# Patient Record
Sex: Female | Born: 1955 | Race: White | Hispanic: No | Marital: Married | Smoking: Current every day smoker
Health system: Southern US, Community
[De-identification: ages and names within clinical notes are randomized; demographics above are authoritative.]

## PROBLEM LIST (undated history)

## (undated) DIAGNOSIS — I10 Essential (primary) hypertension: Secondary | ICD-10-CM

## (undated) DIAGNOSIS — G709 Myoneural disorder, unspecified: Secondary | ICD-10-CM

## (undated) DIAGNOSIS — E079 Disorder of thyroid, unspecified: Secondary | ICD-10-CM

## (undated) DIAGNOSIS — I739 Peripheral vascular disease, unspecified: Secondary | ICD-10-CM

## (undated) DIAGNOSIS — E785 Hyperlipidemia, unspecified: Secondary | ICD-10-CM

## (undated) DIAGNOSIS — D649 Anemia, unspecified: Secondary | ICD-10-CM

## (undated) DIAGNOSIS — T7840XA Allergy, unspecified, initial encounter: Secondary | ICD-10-CM

## (undated) DIAGNOSIS — K219 Gastro-esophageal reflux disease without esophagitis: Secondary | ICD-10-CM

## (undated) DIAGNOSIS — K900 Celiac disease: Secondary | ICD-10-CM

## (undated) DIAGNOSIS — D126 Benign neoplasm of colon, unspecified: Secondary | ICD-10-CM

## (undated) DIAGNOSIS — M797 Fibromyalgia: Secondary | ICD-10-CM

## (undated) DIAGNOSIS — M1990 Unspecified osteoarthritis, unspecified site: Secondary | ICD-10-CM

## (undated) DIAGNOSIS — R011 Cardiac murmur, unspecified: Secondary | ICD-10-CM

## (undated) DIAGNOSIS — K449 Diaphragmatic hernia without obstruction or gangrene: Secondary | ICD-10-CM

## (undated) DIAGNOSIS — K760 Fatty (change of) liver, not elsewhere classified: Secondary | ICD-10-CM

## (undated) HISTORY — PX: HAND SURGERY: SHX662

## (undated) HISTORY — DX: Fibromyalgia: M79.7

## (undated) HISTORY — DX: Anemia, unspecified: D64.9

## (undated) HISTORY — DX: Allergy, unspecified, initial encounter: T78.40XA

## (undated) HISTORY — PX: CERVICAL DISC SURGERY: SHX588

## (undated) HISTORY — PX: TOTAL ABDOMINAL HYSTERECTOMY: SHX209

## (undated) HISTORY — DX: Hyperlipidemia, unspecified: E78.5

## (undated) HISTORY — DX: Disorder of thyroid, unspecified: E07.9

## (undated) HISTORY — DX: Fatty (change of) liver, not elsewhere classified: K76.0

## (undated) HISTORY — DX: Peripheral vascular disease, unspecified: I73.9

## (undated) HISTORY — PX: OTHER SURGICAL HISTORY: SHX169

## (undated) HISTORY — PX: UPPER GASTROINTESTINAL ENDOSCOPY: SHX188

## (undated) HISTORY — DX: Cardiac murmur, unspecified: R01.1

## (undated) HISTORY — DX: Gastro-esophageal reflux disease without esophagitis: K21.9

## (undated) HISTORY — DX: Unspecified osteoarthritis, unspecified site: M19.90

## (undated) HISTORY — DX: Celiac disease: K90.0

## (undated) HISTORY — DX: Myoneural disorder, unspecified: G70.9

## (undated) HISTORY — DX: Benign neoplasm of colon, unspecified: D12.6

## (undated) HISTORY — DX: Diaphragmatic hernia without obstruction or gangrene: K44.9

## (undated) HISTORY — PX: CHOLECYSTECTOMY: SHX55

## (undated) HISTORY — PX: DILATION AND CURETTAGE OF UTERUS: SHX78

## (undated) HISTORY — DX: Essential (primary) hypertension: I10

## (undated) HISTORY — PX: COLONOSCOPY: SHX174

---

## 1999-09-15 ENCOUNTER — Encounter: Admission: RE | Admit: 1999-09-15 | Discharge: 1999-09-15 | Payer: Self-pay | Admitting: Orthopaedic Surgery

## 1999-09-15 ENCOUNTER — Encounter: Payer: Self-pay | Admitting: Orthopaedic Surgery

## 1999-09-27 ENCOUNTER — Encounter: Admission: RE | Admit: 1999-09-27 | Discharge: 1999-10-20 | Payer: Self-pay | Admitting: Orthopaedic Surgery

## 2000-03-30 ENCOUNTER — Encounter: Payer: Self-pay | Admitting: Orthopaedic Surgery

## 2000-03-30 ENCOUNTER — Encounter: Admission: RE | Admit: 2000-03-30 | Discharge: 2000-03-30 | Payer: Self-pay | Admitting: Orthopaedic Surgery

## 2000-09-04 ENCOUNTER — Other Ambulatory Visit: Admission: RE | Admit: 2000-09-04 | Discharge: 2000-09-04 | Payer: Self-pay | Admitting: Gastroenterology

## 2000-09-04 ENCOUNTER — Encounter (INDEPENDENT_AMBULATORY_CARE_PROVIDER_SITE_OTHER): Payer: Self-pay | Admitting: Specialist

## 2002-02-22 ENCOUNTER — Encounter: Admission: RE | Admit: 2002-02-22 | Discharge: 2002-02-22 | Payer: Self-pay | Admitting: Obstetrics and Gynecology

## 2002-02-22 ENCOUNTER — Encounter: Payer: Self-pay | Admitting: Obstetrics and Gynecology

## 2004-11-02 ENCOUNTER — Ambulatory Visit (HOSPITAL_COMMUNITY): Admission: RE | Admit: 2004-11-02 | Discharge: 2004-11-02 | Payer: Self-pay | Admitting: Family Medicine

## 2005-02-09 ENCOUNTER — Ambulatory Visit: Payer: Self-pay | Admitting: Gastroenterology

## 2005-02-11 ENCOUNTER — Ambulatory Visit: Payer: Self-pay | Admitting: Gastroenterology

## 2005-02-15 ENCOUNTER — Ambulatory Visit: Payer: Self-pay | Admitting: Gastroenterology

## 2005-02-15 ENCOUNTER — Encounter (INDEPENDENT_AMBULATORY_CARE_PROVIDER_SITE_OTHER): Payer: Self-pay | Admitting: Specialist

## 2005-03-18 ENCOUNTER — Encounter: Admission: RE | Admit: 2005-03-18 | Discharge: 2005-03-18 | Payer: Self-pay | Admitting: Obstetrics and Gynecology

## 2005-03-24 ENCOUNTER — Ambulatory Visit: Payer: Self-pay | Admitting: Gastroenterology

## 2005-04-04 ENCOUNTER — Ambulatory Visit (HOSPITAL_COMMUNITY): Admission: RE | Admit: 2005-04-04 | Discharge: 2005-04-04 | Payer: Self-pay | Admitting: Gastroenterology

## 2005-05-31 ENCOUNTER — Encounter: Admission: RE | Admit: 2005-05-31 | Discharge: 2005-05-31 | Payer: Self-pay | Admitting: General Surgery

## 2005-06-07 ENCOUNTER — Encounter (INDEPENDENT_AMBULATORY_CARE_PROVIDER_SITE_OTHER): Payer: Self-pay | Admitting: Specialist

## 2005-06-07 ENCOUNTER — Ambulatory Visit (HOSPITAL_COMMUNITY): Admission: RE | Admit: 2005-06-07 | Discharge: 2005-06-07 | Payer: Self-pay | Admitting: General Surgery

## 2005-06-28 ENCOUNTER — Ambulatory Visit: Payer: Self-pay | Admitting: Gastroenterology

## 2005-07-01 ENCOUNTER — Encounter: Payer: Self-pay | Admitting: Gastroenterology

## 2005-07-04 ENCOUNTER — Ambulatory Visit: Payer: Self-pay | Admitting: Gastroenterology

## 2005-07-14 ENCOUNTER — Encounter: Admission: RE | Admit: 2005-07-14 | Discharge: 2005-10-12 | Payer: Self-pay | Admitting: Gastroenterology

## 2005-09-26 ENCOUNTER — Ambulatory Visit: Payer: Self-pay | Admitting: Gastroenterology

## 2006-03-21 ENCOUNTER — Ambulatory Visit (HOSPITAL_COMMUNITY): Admission: RE | Admit: 2006-03-21 | Discharge: 2006-03-21 | Payer: Self-pay | Admitting: Obstetrics and Gynecology

## 2006-05-29 ENCOUNTER — Ambulatory Visit: Payer: Self-pay | Admitting: Gastroenterology

## 2006-08-29 ENCOUNTER — Encounter (INDEPENDENT_AMBULATORY_CARE_PROVIDER_SITE_OTHER): Payer: Self-pay | Admitting: Specialist

## 2006-08-29 ENCOUNTER — Ambulatory Visit: Payer: Self-pay | Admitting: Gastroenterology

## 2007-03-23 ENCOUNTER — Ambulatory Visit (HOSPITAL_COMMUNITY): Admission: RE | Admit: 2007-03-23 | Discharge: 2007-03-23 | Payer: Self-pay | Admitting: Obstetrics and Gynecology

## 2007-09-04 ENCOUNTER — Ambulatory Visit: Payer: Self-pay | Admitting: Gastroenterology

## 2007-09-18 ENCOUNTER — Ambulatory Visit: Payer: Self-pay | Admitting: Gastroenterology

## 2007-09-18 ENCOUNTER — Encounter: Payer: Self-pay | Admitting: Gastroenterology

## 2007-09-20 ENCOUNTER — Encounter: Payer: Self-pay | Admitting: Gastroenterology

## 2007-09-27 ENCOUNTER — Telehealth: Payer: Self-pay | Admitting: Gastroenterology

## 2008-04-29 ENCOUNTER — Ambulatory Visit (HOSPITAL_COMMUNITY): Admission: RE | Admit: 2008-04-29 | Discharge: 2008-04-29 | Payer: Self-pay | Admitting: Obstetrics and Gynecology

## 2008-08-15 ENCOUNTER — Encounter (INDEPENDENT_AMBULATORY_CARE_PROVIDER_SITE_OTHER): Payer: Self-pay | Admitting: *Deleted

## 2008-08-20 ENCOUNTER — Encounter: Admission: RE | Admit: 2008-08-20 | Discharge: 2008-08-20 | Payer: Self-pay | Admitting: Orthopaedic Surgery

## 2008-09-23 ENCOUNTER — Ambulatory Visit: Payer: Self-pay | Admitting: Gastroenterology

## 2008-09-30 ENCOUNTER — Ambulatory Visit: Payer: Self-pay | Admitting: Gastroenterology

## 2008-09-30 ENCOUNTER — Encounter: Payer: Self-pay | Admitting: Gastroenterology

## 2008-10-01 ENCOUNTER — Encounter: Payer: Self-pay | Admitting: Gastroenterology

## 2008-10-01 ENCOUNTER — Telehealth (INDEPENDENT_AMBULATORY_CARE_PROVIDER_SITE_OTHER): Payer: Self-pay

## 2008-10-03 ENCOUNTER — Ambulatory Visit: Payer: Self-pay | Admitting: Gastroenterology

## 2008-10-03 ENCOUNTER — Telehealth: Payer: Self-pay | Admitting: Gastroenterology

## 2008-10-08 LAB — CONVERTED CEMR LAB: Tissue Transglutaminase Ab, IgA: >128 U — ABNORMAL HIGH

## 2008-10-21 ENCOUNTER — Ambulatory Visit: Payer: Self-pay | Admitting: Family Medicine

## 2008-10-21 DIAGNOSIS — K219 Gastro-esophageal reflux disease without esophagitis: Secondary | ICD-10-CM | POA: Insufficient documentation

## 2008-10-21 DIAGNOSIS — Z860100 Personal history of colon polyps, unspecified: Secondary | ICD-10-CM | POA: Insufficient documentation

## 2008-10-21 DIAGNOSIS — R42 Dizziness and giddiness: Secondary | ICD-10-CM | POA: Insufficient documentation

## 2008-10-21 DIAGNOSIS — D509 Iron deficiency anemia, unspecified: Secondary | ICD-10-CM | POA: Insufficient documentation

## 2008-10-21 LAB — CONVERTED CEMR LAB: Blood Glucose, Fingerstick: 126

## 2009-02-20 ENCOUNTER — Ambulatory Visit: Payer: Self-pay | Admitting: Family Medicine

## 2009-06-03 ENCOUNTER — Ambulatory Visit: Payer: Self-pay | Admitting: Family Medicine

## 2009-06-03 DIAGNOSIS — M542 Cervicalgia: Secondary | ICD-10-CM | POA: Insufficient documentation

## 2009-06-12 ENCOUNTER — Ambulatory Visit: Payer: Self-pay | Admitting: Family Medicine

## 2009-06-12 DIAGNOSIS — M797 Fibromyalgia: Secondary | ICD-10-CM | POA: Insufficient documentation

## 2009-06-16 ENCOUNTER — Ambulatory Visit (HOSPITAL_COMMUNITY): Admission: RE | Admit: 2009-06-16 | Discharge: 2009-06-16 | Payer: Self-pay | Admitting: Obstetrics and Gynecology

## 2009-07-16 ENCOUNTER — Ambulatory Visit (HOSPITAL_COMMUNITY): Admission: RE | Admit: 2009-07-16 | Discharge: 2009-07-16 | Payer: Self-pay | Admitting: Neurological Surgery

## 2009-08-17 ENCOUNTER — Encounter: Admission: RE | Admit: 2009-08-17 | Discharge: 2009-08-17 | Payer: Self-pay | Admitting: Neurological Surgery

## 2009-09-07 ENCOUNTER — Ambulatory Visit: Payer: Self-pay | Admitting: Family Medicine

## 2009-09-07 DIAGNOSIS — I10 Essential (primary) hypertension: Secondary | ICD-10-CM | POA: Insufficient documentation

## 2009-09-07 DIAGNOSIS — H8109 Meniere's disease, unspecified ear: Secondary | ICD-10-CM | POA: Insufficient documentation

## 2009-09-07 DIAGNOSIS — E039 Hypothyroidism, unspecified: Secondary | ICD-10-CM | POA: Insufficient documentation

## 2009-09-07 DIAGNOSIS — R1011 Right upper quadrant pain: Secondary | ICD-10-CM | POA: Insufficient documentation

## 2009-09-09 LAB — CONVERTED CEMR LAB
BUN: 15 mg/dL (ref 6–23)
CO2: 33 meq/L — ABNORMAL HIGH (ref 19–32)
Calcium: 10.3 mg/dL (ref 8.4–10.5)
Chloride: 99 meq/L (ref 96–112)
Creatinine, Ser: 0.6 mg/dL (ref 0.4–1.2)
GFR calc non Af Amer: 110.84 mL/min (ref >60)
Glucose, Bld: 87 mg/dL (ref 70–99)
Potassium: 3.9 meq/L (ref 3.5–5.1)
Sodium: 141 meq/L (ref 135–145)
TSH: 1.53 microintl units/mL (ref 0.35–5.50)

## 2009-09-18 ENCOUNTER — Encounter (INDEPENDENT_AMBULATORY_CARE_PROVIDER_SITE_OTHER): Payer: Self-pay | Admitting: *Deleted

## 2009-09-23 ENCOUNTER — Telehealth: Payer: Self-pay | Admitting: Gastroenterology

## 2009-09-23 ENCOUNTER — Encounter: Payer: Self-pay | Admitting: Gastroenterology

## 2009-09-23 ENCOUNTER — Encounter (INDEPENDENT_AMBULATORY_CARE_PROVIDER_SITE_OTHER): Payer: Self-pay

## 2009-09-24 ENCOUNTER — Ambulatory Visit: Payer: Self-pay | Admitting: Gastroenterology

## 2009-09-25 ENCOUNTER — Ambulatory Visit: Payer: Self-pay | Admitting: Gastroenterology

## 2009-09-25 LAB — CONVERTED CEMR LAB: IgA: 143 mg/dL (ref 68–378)

## 2009-09-29 ENCOUNTER — Encounter: Payer: Self-pay | Admitting: Gastroenterology

## 2009-09-29 DIAGNOSIS — K900 Celiac disease: Secondary | ICD-10-CM | POA: Insufficient documentation

## 2009-09-29 LAB — CONVERTED CEMR LAB: Tissue Transglutaminase Ab, IgA: 160 units — ABNORMAL HIGH (ref <20)

## 2009-10-08 ENCOUNTER — Encounter: Payer: Self-pay | Admitting: Family Medicine

## 2009-10-10 ENCOUNTER — Encounter: Payer: Self-pay | Admitting: Gastroenterology

## 2009-10-16 ENCOUNTER — Encounter: Payer: Self-pay | Admitting: Family Medicine

## 2009-10-20 ENCOUNTER — Encounter: Admission: RE | Admit: 2009-10-20 | Discharge: 2009-10-20 | Payer: Self-pay | Admitting: Neurological Surgery

## 2009-10-21 ENCOUNTER — Telehealth (INDEPENDENT_AMBULATORY_CARE_PROVIDER_SITE_OTHER): Payer: Self-pay

## 2009-10-22 ENCOUNTER — Encounter: Payer: Self-pay | Admitting: Gastroenterology

## 2009-10-23 ENCOUNTER — Ambulatory Visit: Payer: Self-pay | Admitting: Gastroenterology

## 2009-10-26 LAB — CONVERTED CEMR LAB
ALT: 21 units/L (ref 0–35)
AST: 20 units/L (ref 0–37)
Albumin: 4 g/dL (ref 3.5–5.2)
Alkaline Phosphatase: 52 units/L (ref 39–117)
BUN: 17 mg/dL (ref 6–23)
Basophils Absolute: 0 10*3/uL (ref 0.0–0.1)
Basophils Relative: 0.5 % (ref 0.0–3.0)
CO2: 33 meq/L — ABNORMAL HIGH (ref 19–32)
Calcium: 9.9 mg/dL (ref 8.4–10.5)
Chloride: 101 meq/L (ref 96–112)
Creatinine, Ser: 0.5 mg/dL (ref 0.4–1.2)
Eosinophils Absolute: 0.1 10*3/uL (ref 0.0–0.7)
Eosinophils Relative: 1.4 % (ref 0.0–5.0)
Ferritin: 36.5 ng/mL (ref 10.0–291.0)
GFR calc non Af Amer: 127.84 mL/min (ref >60)
Glucose, Bld: 85 mg/dL (ref 70–99)
HCT: 37.5 % (ref 36.0–46.0)
Hemoglobin: 13.1 g/dL (ref 12.0–15.0)
Iron: 61 ug/dL (ref 42–145)
Lymphocytes Relative: 50.4 % — ABNORMAL HIGH (ref 12.0–46.0)
Lymphs Abs: 3.3 10*3/uL (ref 0.7–4.0)
MCHC: 35 g/dL (ref 30.0–36.0)
MCV: 93.8 fL (ref 78.0–100.0)
Monocytes Absolute: 0.3 10*3/uL (ref 0.1–1.0)
Monocytes Relative: 4.1 % (ref 3.0–12.0)
Neutro Abs: 2.9 10*3/uL (ref 1.4–7.7)
Neutrophils Relative %: 43.6 % (ref 43.0–77.0)
Platelets: 163 10*3/uL (ref 150.0–400.0)
Potassium: 3.9 meq/L (ref 3.5–5.1)
RBC: 4 M/uL (ref 3.87–5.11)
RDW: 13.4 % (ref 11.5–14.6)
Saturation Ratios: 17 % — ABNORMAL LOW (ref 20.0–50.0)
Sodium: 142 meq/L (ref 135–145)
Total Bilirubin: 0.5 mg/dL (ref 0.3–1.2)
Total Protein: 7 g/dL (ref 6.0–8.3)
Transferrin: 256.2 mg/dL (ref 212.0–360.0)
Vit D, 25-Hydroxy: 89 ng/mL (ref 30–89)
WBC: 6.6 10*3/uL (ref 4.5–10.5)

## 2009-10-30 ENCOUNTER — Encounter: Payer: Self-pay | Admitting: Family Medicine

## 2009-10-30 ENCOUNTER — Encounter: Payer: Self-pay | Admitting: Gastroenterology

## 2009-11-09 ENCOUNTER — Ambulatory Visit: Payer: Self-pay | Admitting: Family Medicine

## 2010-01-18 ENCOUNTER — Encounter: Admission: RE | Admit: 2010-01-18 | Discharge: 2010-01-18 | Payer: Self-pay | Admitting: Neurological Surgery

## 2010-03-08 ENCOUNTER — Emergency Department (HOSPITAL_COMMUNITY): Admission: EM | Admit: 2010-03-08 | Discharge: 2010-03-08 | Payer: Self-pay | Admitting: Emergency Medicine

## 2010-03-09 ENCOUNTER — Ambulatory Visit: Payer: Self-pay | Admitting: Family Medicine

## 2010-03-09 ENCOUNTER — Telehealth: Payer: Self-pay | Admitting: Family Medicine

## 2010-03-09 DIAGNOSIS — F1120 Opioid dependence, uncomplicated: Secondary | ICD-10-CM | POA: Insufficient documentation

## 2010-03-18 ENCOUNTER — Ambulatory Visit: Payer: Self-pay | Admitting: Family Medicine

## 2010-04-05 ENCOUNTER — Ambulatory Visit: Payer: Self-pay | Admitting: Family Medicine

## 2010-04-06 ENCOUNTER — Encounter: Payer: Self-pay | Admitting: Family Medicine

## 2010-04-07 ENCOUNTER — Telehealth: Payer: Self-pay | Admitting: Family Medicine

## 2010-04-28 ENCOUNTER — Encounter
Admission: RE | Admit: 2010-04-28 | Discharge: 2010-04-28 | Payer: Self-pay | Source: Home / Self Care | Admitting: Orthopaedic Surgery

## 2010-05-25 ENCOUNTER — Telehealth: Payer: Self-pay | Admitting: Gastroenterology

## 2010-05-27 ENCOUNTER — Ambulatory Visit: Admit: 2010-05-27 | Payer: Self-pay | Admitting: Gastroenterology

## 2010-06-13 ENCOUNTER — Encounter: Payer: Self-pay | Admitting: General Surgery

## 2010-06-22 NOTE — Assessment & Plan Note (Signed)
Summary: blood sugars running low to est here/mhf   Vital Signs:  Patient profile:   55 year old female LMP:     05/24/1995 Height:      62.75 inches Weight:      147 pounds BMI:     26.34 Temp:     97.8 degrees F oral BP sitting:   106 / 70  (left arm) Cuff size:   regular  Vitals Entered By: Nira Conn LPN (October 21, 9981 3:82 AM) CC: Previous [place] pt, to establish, requesting labs CBG Result 126 LMP (date): 05/24/1995  years   days  Enter LMP: 05/24/1995   History of Present Illness: Patient seen to establish care. She has history of celiac disease and history of GERD. She is here with chief complaint of concern that her blood sugars may be dropping too low. No history of diabetes. She recently felt dizzy and lightheaded and had some slight bilateral blurred vision and checked her blood sugar meter which was 42. She subsequently checked with her cousin's meter and this was in the normal range. She thinks her glucose meter may be malfunctioning. She tries to follow low gluten diet but sometimes has high sugar type beverages. She has not had any associated syncope, nausea, or diaphoresis. Symptoms have not been daily and had been very inconsistent.  Allergies (verified): No Known Drug Allergies  Past History:  Family History: Last updated: 10/21/2008 Family History of Alcoholism/Addiction blood relative Family History of Arthritis blood relative breast cancer blood relative lung cancer blood relative postate cancer, parent, blood relative heart disease, parnet, blood relative stroke, parent, blood relative Family History Hypertension, parent, blood relative Diabetes, parent, blood relative  Social History: Last updated: 10/21/2008 Retired Married Current Smoker Regular exercise-yes  Risk Factors: Smoking Status: current (10/21/2008)  Past Medical History: Anemia-iron deficiency Colonic polyps, hx of GERD Arthritis Cancer/malignancy heart murmur  migraines Thyroid problems/hypothyroidism celiac disease meniers disease  Past Surgical History: Cholecystectomy 2006 Hysterectomy  1997 Tonsillectomy  1963 Fractured nose 1988 nose 1990  Family History: Family History of Alcoholism/Addiction blood relative Family History of Arthritis blood relative breast cancer blood relative lung cancer blood relative postate cancer, parent, blood relative heart disease, parnet, blood relative stroke, parent, blood relative Family History Hypertension, parent, blood relative Diabetes, parent, blood relative  Social History: Retired Married Current Smoker Regular exercise-yes Smoking Status:  current Does Patient Exercise:  yes  Review of Systems  The patient denies anorexia, fever, weight loss, weight gain, vision loss, chest pain, syncope, and headaches.    Physical Exam  General:  Well-developed,well-nourished,in no acute distress; alert,appropriate and cooperative throughout examination Eyes:  No corneal or conjunctival inflammation noted. EOMI. Perrla. Funduscopic exam benign, without hemorrhages, exudates or papilledema. Vision grossly normal. Neck:  No deformities, masses, or tenderness noted. Lungs:  Normal respiratory effort, chest expands symmetrically. Lungs are clear to auscultation, no crackles or wheezes. Heart:  Normal rate and regular rhythm. S1 and S2 normal without gallop, murmur, click, rub or other extra sounds. Extremities:  no edema Neurologic:  cranial nerves II through XII are normal. Strength reveals no focal deficits. Cerebellar function normal   Impression & Recommendations:  Problem # 1:  DIZZINESS (ICD-780.4)  Doubt true hypoglycemia but will check fasting blood sugar to make sure. We have stressed the importance of not skipping meals and also trying to get a balanced diet with adequate amounts of protein with each meal and avoidance of high glycemic foods.  Orders: Glucose, (CBG) [phone])  Complete Medication List: 1)  Omeprazole 20 Mg Cpdr (Omeprazole) .[name].. 1 each day 30 minutes before meal qam 2)  Klor-con M10 10 Meq Cr-tabs (Potassium chloride crys cr) .... One tab two times a day 3)  Levothroid 200 Mcg Tabs (Levothyroxine sodium) .... Once daily 4)  Chlorthalidone 25 Mg Tabs (Chlorthalidone) .... Once daily 5)  Ultram 50 Mg Tabs (Tramadol hcl) .... 2 tabs two times a day 6)  Proair Hfa 108 (90 Base) Mcg/act Aers (Albuterol sulfate) .... Three times a day  Other Orders: Fingerstick (91660)  Patient Instructions: 1)  Avoid skipping meals. Avoid high glucose-containing beverages or other high glucose-containing foods. Try to get balanced diet with adequate amounts of protein with each meal.  Preventive Care Screening  Mammogram:    Date:  06/23/2008    Results:  normal   Colonoscopy:    Date:  05/23/2000    Results:  normal

## 2010-06-22 NOTE — Consult Note (Signed)
Summary: [facility]   Imported By: Laural Benes 12/15/2009 09:52:52  _____________________________________________________________________  External Attachment:    Type:   Image     Comment:   External Document

## 2010-06-22 NOTE — Progress Notes (Signed)
Summary: advise of dosage  Phone Note Call from Patient Call back at Home Phone [phone]   Caller: [facility] mail Reason for Call: Talk to Nurse Summary of Call: [facility]. pt called and said that she has been on 11m, instead of 5 mg. please return her call about the meds. pt did not sleep last night. Initial call taken by: CDespina Arias  March 09, 2010 2:48 PM  Follow-up for Phone Call        [place]325  NNira ConnLPN  October 19, 258069:31 AM Pt should be on 5 mg.  She has been getting (per pt) by nonprescription and our goal is to taper her off this.  It is IMPERATIVE that she go with taper as instructed or we will be unable to work with her to taper off. Follow-up by: BCarolann LittlerMD,  March 10, 2010 12:25 PM  Additional Follow-up for Phone Call Additional follow up Details #1::        Pt informed Additional Follow-up by: NNira ConnLPN,  October 19, 238682:58 PM

## 2010-06-22 NOTE — Progress Notes (Signed)
Summary: Triage   Phone Note From Other Clinic   Caller: [facility] @ Dr. Ronnald Ramp [phone] Call For: Dr. Fuller Plan Summary of Call: Per Lattie Haw @ Dr. Ronnald Ramp office calling to give clearance it is okay to have Endo on Friday Initial call taken by: Webb Laws,  Sep 23, 2009 10:15 AM  Follow-up for Phone Call        noted patient aware Barb Merino RN, CGRN  Sep 23, 2009 10:22 AM

## 2010-06-22 NOTE — Consult Note (Signed)
Summary: [facility]  [facility]   Imported By: Phillis Knack 10/26/2009 14:06:06  _____________________________________________________________________  External Attachment:    Type:   Image     Comment:   External Document

## 2010-06-22 NOTE — Assessment & Plan Note (Signed)
Summary: ABD PAIN // RS   Vital Signs:  Patient profile:   55 year old female Weight:      146 pounds Temp:     98.6 degrees F oral BP sitting:   130 / 78  (left arm) Cuff size:   regular  Vitals Entered By: Nira Conn LPN (September 08, 5679 27:51 PM) CC: Abd pain, ? hernia   History of Present Illness: Patient here today for the following.  Recent intermittent bouts right upper quadrant pain. Prior history cholecystectomy. Since last year she has intermittent bouts of right upper quadrant swelling and fairly intense pressure pain which improves after lying down. She is concerned about possible incisional hernia. Has never had associated nausea or vomiting.  Hypothyroidism treated with Levothroid. Needs repeat labs.  Other problems include history of GERD, fibromyalgia, and celiac disease  has been on chronic chlorthalidone therapy related to Mnire's disease. No recent electrolytes.  Allergies: 1)  Ibuprofen (Ibuprofen)  Past History:  Past Medical History: Last updated: 06/03/2009 Anemia-iron deficiency Colonic polyps, hx of GERD Arthritis Cancer/malignancy heart murmur migraines Thyroid problems/hypothyroidism celiac disease meniere's disease ? Fibromyalgia  Past Surgical History: Last updated: 10/21/2008 Cholecystectomy 2006 Hysterectomy  1997 Tonsillectomy  1963 Fractured nose 1988 nose 1990  Family History: Last updated: 10/21/2008 Family History of Alcoholism/Addiction blood relative Family History of Arthritis blood relative breast cancer blood relative lung cancer blood relative postate cancer, parent, blood relative heart disease, parnet, blood relative stroke, parent, blood relative Family History Hypertension, parent, blood relative Diabetes, parent, blood relative  Social History: Last updated: 10/21/2008 Retired Married Current Smoker Regular exercise-yes  Risk Factors: Exercise: yes (10/21/2008)  Risk Factors: Smoking Status:  current (10/21/2008)  Review of Systems  The patient denies anorexia, fever, weight loss, chest pain, syncope, dyspnea on exertion, peripheral edema, prolonged cough, melena, hematochezia, and severe indigestion/heartburn.    Physical Exam  General:  Well-developed,well-nourished,in no acute distress; alert,appropriate and cooperative throughout examination Mouth:  Oral mucosa and oropharynx without lesions or exudates.  Teeth in good repair. Neck:  No deformities, masses, or tenderness noted. Lungs:  Normal respiratory effort, chest expands symmetrically. Lungs are clear to auscultation, no crackles or wheezes. Heart:  normal rate, regular rhythm, and no murmur.   Abdomen:  soft, non-tender, normal bowel sounds, no distention, no masses, no guarding, no rigidity, no hepatomegaly, and no splenomegaly.  No palpable hernia at this time.   Impression & Recommendations:  Problem # 1:  RUQ PAIN (ICD-789.01)  Intermittent symptoms. Question of incisional hernia. Referral back to general surgeon for further evaluation  Orders: Surgical Referral (Surgery)  Problem # 2:  HYPOTHYROIDISM (ICD-244.9) needs repeat labs Her updated medication list for this problem includes:    Levothroid 200 Mcg Tabs (Levothyroxine sodium) ..... Once daily  Orders: TLB-TSH (Thyroid Stimulating Hormone) (84443-TSH)  Problem # 3:  MENIERE'S DISEASE (ICD-386.00) chronic diuretic use.  Check electrolytes.  Complete Medication List: 1)  Omeprazole 20 Mg Cpdr (Omeprazole) .[name].. 1 each day 30 minutes before meal qam 2)  Klor-con M10 10 Meq Cr-tabs (Potassium chloride crys cr) .... One tab two times a day 3)  Levothroid 200 Mcg Tabs (Levothyroxine sodium) .... Once daily 4)  Chlorthalidone 25 Mg Tabs (Chlorthalidone) .... Once daily 5)  Ultram 50 Mg Tabs (Tramadol hcl) .... 2 tabs two times a day 6)  Proair Hfa 108 (90 Base) Mcg/act Aers (Albuterol sulfate) .... Three times a day 7)  Oxycodone-acetaminophen 5-325  Mg Tabs (Oxycodone-acetaminophen) .... One to two by  mouth q 4-6 hours as needed pain 8)  Gabapentin 300 Mg Caps (Gabapentin) .... One tab three times a day  Other Orders: TLB-BMP (Basic Metabolic Panel-BMET) ([id number]-PJSRPRX)  Patient Instructions: 1)  we will call you regarding referral to general surgeon Prescriptions: PROAIR HFA 108 (90 BASE) MCG/ACT AERS (ALBUTEROL SULFATE) three times a day  #1 x 3   Entered and Authorized by:   [name] MD   Signed by:   [name] MD on 09/07/2009   Method used:   [facility]        [id number]       [address]       [place], [place]  [address]       Ph: [phone] or [phone]       Fax: [phone]   RxID:   [id number] KLOR-CON M10 10 MEQ CR-TABS (POTASSIUM CHLORIDE CRYS CR) one tab two times a day  #90 x 3   Entered and Authorized by:   [name] MD   Signed by:   [name] MD on 09/07/2009   Method used:   [facility]        [id number]       [address]       [place], [place]  [address]       Ph: [phone] or [phone]       Fax: [phone]   RxID:   [id number] LEVOTHROID 200 MCG TABS (LEVOTHYROXINE SODIUM) once daily  #90 x 3   Entered and Authorized by:   [name] MD   Signed by:   [name] MD on 09/07/2009   Method used:   [facility]        [id number]       [address]       [place], [place]  [address]       Ph: [phone] or [phone]       Fax: [phone]   RxID:   [id number]

## 2010-06-22 NOTE — Assessment & Plan Note (Signed)
Summary: F/U ON BODY PAIN // RS   Vital Signs:  Patient profile:   55 year old female Weight:      151 pounds Temp:     98.1 degrees F oral BP sitting:   150 / 90  (left arm) Cuff size:   regular  Vitals Entered By: Nira Conn LPN (April 05, 4708 1:10 PM)  History of Present Illness: Patient is seen for followup. Refer to previous notes. Recent opioid dependency and opioid abuse. She has history of fibromyalgia has also been diagnosed by a rheumatologist with left lateral epicondylitis and bilateral hip bursitis. Patient self referred to acupuncturist has had 8 treatments without improvement. She is very frustrated stating that she aches all over. She is not sleeping well. Not exercising secondary to pain. Using Ultram without relief. At this point is off oxycodone altogether. She had been buying this off the street. We agreed to help her taper off oxycodone and she agreed to nofurther prescriptions and explained our preference that she be managed for chronic pain management Center if pain not adequately controlled.  She prefers at this time to discuss further with her rheumatologist. She does have occasional crying spells. Decreased motivation. Denies suicidal ideation. She is refusing consideration of antidepressants at this time.  Allergies: 1)  Ibuprofen (Ibuprofen)  Past History:  Past Medical History: Last updated: 06/03/2009 Anemia-iron deficiency Colonic polyps, hx of GERD Arthritis Cancer/malignancy heart murmur migraines Thyroid problems/hypothyroidism celiac disease meniere's disease ? Fibromyalgia PMH reviewed for relevance  Physical Exam  General:  patient is alert ,cooperative ,crying off and on during interview. Mouth:  Oral mucosa and oropharynx without lesions or exudates.  Teeth in good repair. Neck:  No deformities, masses, or tenderness noted. Lungs:  Normal respiratory effort, chest expands symmetrically. Lungs are clear to auscultation, no  crackles or wheezes. Heart:  Normal rate and regular rhythm. S1 and S2 normal without gallop, murmur, click, rub or other extra sounds. Extremities:  tender left lateral epicondylar region. Neurologic:  alert & oriented X3, cranial nerves II-XII intact, and strength normal in all extremities.   Psych:  poor eye contact, labile affect, tearful, and moderately anxious.     Impression & Recommendations:  Problem # 1:  FIBROMYALGIA (ICD-729.1) long discussion with patient regarding options. At this point she would prefer to talk to her rheumatologist as a next step. We have offered referral for chronic pain management Center. We have not agree to any further opioid use. We discussed other possible options such as Cymbalta and she is not interested The following medications were removed from the medication list:    Oxycodone Hcl 5 Mg Tabs (Oxycodone hcl) .[name]... 1-2 by mouth q 6 hours as needed Her updated medication list for this problem includes:    Ultram 50 Mg Tabs (Tramadol hcl) .[name]... 2 tabs two times a day  Complete Medication List: 1)  Omeprazole 20 Mg Cpdr (Omeprazole) .[name].. 1 each day 30 minutes before meal qam 2)  Klor-con M10 10 Meq Cr-tabs (Potassium chloride crys cr) .... One tab two times a day 3)  Levothroid 200 Mcg Tabs (Levothyroxine sodium) .... Once daily 4)  Chlorthalidone 25 Mg Tabs (Chlorthalidone) .... Once daily 5)  Ultram 50 Mg Tabs (Tramadol hcl) .... 2 tabs two times a day 6)  Proair Hfa 108 (90 Base) Mcg/act Aers (Albuterol sulfate) .... 2 puffs every 4-6 hours as needed 7)  Gabapentin 300 Mg Caps (Gabapentin) .... One tab three times a day 8)  Accupril 10  Mg Tabs (Quinapril hcl) .[name].. 1 by mouth daily Prescriptions: ACCUPRIL 10 MG TABS (QUINAPRIL HCL) 1 by mouth daily  #90 x 3   Entered by:   Nira Conn LPN   Authorized by:   [name] MD   Signed by:   Nira Conn LPN on 68/61/6837   Method used:   [facility]        [facility]       [address] [place]       [place], [place]  [address]       Ph: [phone] or [phone]       Fax: [phone]   RxID:   [id number]    Orders Added: 1)  Est. Patient Level III [[address]]

## 2010-06-22 NOTE — Letter (Signed)
Summary: EGD Instructions  [facility]  [place], [place] [address]   Phone: [phone]  Fax: [phone]       Kimberly Stein    [birth year]    MRN: [id number]       Procedure Day /Date:  Friday 09/25/2009     Arrival Time:  10:30 am      Procedure Time: 11:30 am     Location of Procedure:                    _  x_ [place] (4th Floor)   PREPARATION FOR ENDOSCOPY   On Friday 5/6 THE DAY OF THE PROCEDURE:  1.   No solid foods, milk or milk products are allowed after midnight the night before your procedure.  2.   Do not drink anything colored red or purple.  Avoid juices with pulp.  No orange juice.  3.  You may drink clear liquids until 9:30 am , which is 2 hours before your procedure.                                                                                                CLEAR LIQUIDS INCLUDE: Water Jello Ice Popsicles Tea (sugar ok, no milk/cream) Powdered fruit flavored drinks Coffee (sugar ok, no milk/cream) Gatorade Juice: apple, white grape, white cranberry  Lemonade Clear bullion, consomm, broth Carbonated beverages (any kind) Strained chicken noodle soup Hard Candy   MEDICATION INSTRUCTIONS  Unless otherwise instructed, you should take regular prescription medications with a small sip of water as early as possible the morning of your procedure..  Additional medication instructions: Do not take diuretic am of procedure.             OTHER INSTRUCTIONS  You will need a responsible adult at least 55 years of age to accompany you and drive you home.   This person must remain in the waiting room during your procedure.  Wear loose fitting clothing that is easily removed.  Leave jewelry and other valuables at home.  However, you may wish to bring a book to read or an iPod/MP3 player to listen to music as you wait for your procedure to start.  Remove all body piercing jewelry and leave at home.  Total  time from sign-in until discharge is approximately 2-3 hours.  You should go home directly after your procedure and rest.  You can resume normal activities the day after your procedure.  The day of your procedure you should not:   Drive   Make legal decisions   Operate machinery   Drink alcohol   Return to work  You will receive specific instructions about eating, activities and medications before you leave.    The above instructions have been reviewed and explained to me by   Cornelia Copa RN  Sep 24, 2009 3:03 PM     I fully understand and can verbalize these instructions _____________________________ Date _________

## 2010-06-22 NOTE — Miscellaneous (Signed)
Summary: Controlled Substances Contract  Controlled Substances Contract   Imported By: Laural Benes 03/11/2010 09:30:03  _____________________________________________________________________  External Attachment:    Type:   Image     Comment:   External Document

## 2010-06-22 NOTE — Letter (Signed)
Summary: [facility]   Imported By: Laural Benes 04/13/2010 15:46:15  _____________________________________________________________________  External Attachment:    Type:   Image     Comment:   External Document

## 2010-06-22 NOTE — Progress Notes (Signed)
Summary: Triage   Phone Note Call from Patient Call back at Home Phone [phone]   Caller: Patient Call For: Dr. Fuller Plan Reason for Call: Talk to Nurse Summary of Call: Pt received recall EDG. She had Cervical spine surgery in Jan. and still sore. Wants to know if she can still have endo? Initial call taken by: Webb Laws,  Sep 23, 2009 8:18 AM  Follow-up for Phone Call        [facility] for EGD as far as we are concerned Barb Merino RN, CGRN  Sep 23, 2009 9:09 AM  EGD scheduled for 09/25/09 11:30, pre-visit 09/24/09 2:30 Follow-up by: Barb Merino RN, CGRN,  Sep 23, 2009 9:14 AM

## 2010-06-22 NOTE — Procedures (Signed)
Summary: Upper Endoscopy  Patient: Kimberly Stein Note: All result statuses are Final unless otherwise noted.  Tests: (1) Upper Endoscopy (EGD)   EGD Upper Endoscopy       [facility]     [place], [place]  [address]           ENDOSCOPY PROCEDURE REPORT           PATIENT:  Kimberly Stein, Kimberly Stein  MR#:  [id number]     BIRTHDATE:  08/07/1955, 50 yrs. old  GENDER:  female     ENDOSCOPIST:  Norberto Sorenson T. Fuller Plan, MD, [facility]           PROCEDURE DATE:  09/25/2009     PROCEDURE:  EGD with biopsy     ASA CLASS:  Class II     INDICATIONS:  small bowel biopsy to follow up celiac disease     MEDICATIONS:  Fentanyl 75 mcg IV, Versed 8 mg IV     TOPICAL ANESTHETIC:  Exactacain Spray     DESCRIPTION OF PROCEDURE:   After the risks benefits and     alternatives of the procedure were thoroughly explained, informed     consent was obtained.  The LB [id number] endoscope was     introduced through the mouth and advanced to the second portion of     the duodenum, without limitations.  The instrument was slowly     withdrawn as the mucosa was fully examined.     <<PROCEDUREIMAGES>>           The esophagus and gastroesophageal junction were completely normal     in appearance. The stomach was entered and closely examined. The     pylorus, antrum, angularis, and lesser curvature were well     visualized, including a retroflexed view of the cardia and fundus.     The stomach wall was normally distensable. The scope passed easily     through the pylorus into the duodenum. The duodenal bulb was     normal in appearance, as was the postbulbar duodenum except for     abnormally scalloped folds. Multiple biopsies were obtained and     sent to pathology. Retroflexed views revealed no abnormalities.     The scope was then withdrawn from the patient and the procedure     completed.     COMPLICATIONS: none           ENDOSCOPIC IMPRESSION:     Normal EGD        RECOMMENDATIONS:     1) Await pathology results     2) Celiac antibody panel           Shahid Flori T. Fuller Plan, MD, [facility]           CC:  [name], MD           n.     Lorrin MaisPricilla Riffle. Shelby Peltz at 09/25/2009 12:11 PM           Starla Link, [phone]  Note: An exclamation mark (!) indicates a result that was not dispersed into the flowsheet. Document Creation Date: 09/25/2009 12:11 PM _______________________________________________________________________  (1) Order result status: Final Collection or observation date-time: 09/25/2009 12:02 Requested date-time:  Receipt date-time:  Reported date-time:  Referring Physician:   Ordering Physician: Lucio Edward [id number]) Specimen Source:  Source: Tawanna Cooler Order Number: [id number] Lab site:   Appended Document: Orders Update    Clinical  Lists Changes  Orders: Added new Test order of T-Sprue Panel (Celiac Disease Aby Eval) (83516x3/[id number]) - Signed Added new Test order of TLB-IgA (Immunoglobulin A) (82784-IGA) - Signed      Appended Document: Upper Endoscopy     Procedures Next Due Date:    EGD: 09/2010

## 2010-06-22 NOTE — Letter (Signed)
Summary: [facility]   Imported By: Laural Benes 11/13/2009 12:56:25  _____________________________________________________________________  External Attachment:    Type:   Image     Comment:   External Document

## 2010-06-22 NOTE — Letter (Signed)
Summary: Patient [facility] Biopsy Results  [facility]  [place], [place] [address]   Phone: [phone]  Fax: [phone]        Oct 01, 2008 MRN: [id number]    [facility] [address] [place], [place]  [address]    Dear Ms. Mangrum,  I am pleased to inform you that the biopsies taken during your recent endoscopic examination did not show any evidence of cancer upon pathologic examination.   The biopsies show changes of celiac disease indicating that you are still exposed to gluten. The biopsies are not signicantly different from your prior biopsies. Please review your diet carefully especially for all meals you eat out of your home.  My nurse will contact you regarding repeating your celiac disease blood tests to see if they are improving.  Continue with the treatment plan as outlined on the day of your      exam.  You should have a repeat endoscopic examination for this problem              in 1 year.  Please call us if you are having persistent problems or have questions about your condition that have not been fully answered at this time.  Sincerely,  Ladene Artist MD [facility]  This letter has been electronically signed by your physician.

## 2010-06-22 NOTE — Letter (Signed)
Summary: Endoscopy Letter  [facility]  [place], [place] [address]   Phone: [phone]  Fax: [phone]      September 18, 2009 MRN: [id number]   [facility] [address] [place], [place]  [address]   Dear Ms. Meloche,   According to your medical record, it is time for you to schedule an Endoscopy. Endoscopic screening is recommended for patients with certain upper digestive tract conditions because of associated increased risk for cancers of the upper digestive system.  This letter has been generated based on the recommendations made at the time of your prior procedure. If you feel that in your particular situation this may no longer apply, please contact our office.  Please call our office at [phone]) to schedule this appointment or to update your records at your earliest convenience.  Thank you for cooperating with [place] to provide you with the very best care possible.   Sincerely,  Norberto Sorenson T. Fuller Plan, M.D.  [facility] Gastroenterology Division [phone]

## 2010-06-22 NOTE — Progress Notes (Signed)
Summary: Follow up from [facility] consult   ---- Converted from flag ---- ---- 10/20/2009 3:54 PM, Ladene Artist MD [facility] wrote: Dr. Dannette Barbara note reviewed. He will speak with Dr. Nyoka Cowden in [place] soon and contact the patient. He recommended a CMET, CBC, Vit D, fe/tibc/ferritin yearly, please order now. Confirm recall screening colonosocpy date in 2012. Confirm regaular bone denisity studies done at [facility] every other year. ------------------------------  Phone Note Outgoing Call Call back at [facility] Phone [phone]   Call placed by: Barb Merino RN, CGRN,  October 21, 2009 10:38 AM Call placed to: Patient Summary of Call: I have confirmed colon recall is due 5/12  Follow-up for Phone Call        Patient returned call she is having her bone density scan next Friday she will send [place] and Dr Drema Halon a copy of the report, she has been having them routinely every other year .  She will come for lab work this or next week.  She is aware of the colon recall  scheduled for 09/2010.  Follow-up by: Barb Merino RN, CGRN,  October 21, 2009 11:40 AM

## 2010-06-22 NOTE — Assessment & Plan Note (Signed)
Summary: 3-4 DAY ROV//SLM   Vital Signs:  Patient profile:   55 year old female Weight:      148 pounds Temp:     98.3 degrees F oral BP sitting:   130 / 90  (left arm) Cuff size:   regular  Vitals Entered By: Nira Conn LPN (March 18, 6578 9:57 AM)  History of Present Illness: Pt here  to follow up from last visit with discussion about tapering off Oxycodone. Currently on 2 daily and doing very well with no signif withdrawal symptoms. She will be totally off med in 4 days.  She has tapered exactlly as we recommended. She has supportive family.  Needs refill Proair.  Still smokes.  Resp status stable. Needs chlorthalidone refill.  Ongoing L hip pain. Injected for "bursitis" in past .  Ultram helps somewhat. Pain is lateral and achy quality and moderate to severe.  She understands she cannot  go back to opioids at this time.  She has considered setting up with accupuncturist.  Hypertension History:      She denies headache, chest pain, palpitations, dyspnea with exertion, orthopnea, PND, peripheral edema, visual symptoms, neurologic problems, syncope, and side effects from treatment.        Positive major cardiovascular risk factors include hypertension and current tobacco user.  Negative major cardiovascular risk factors include female age less than 62 years old.     Allergies: 1)  Ibuprofen (Ibuprofen)  Past History:  Past Medical History: Last updated: 06/03/2009 Anemia-iron deficiency Colonic polyps, hx of GERD Arthritis Cancer/malignancy heart murmur migraines Thyroid problems/hypothyroidism celiac disease meniere's disease ? Fibromyalgia  Past Surgical History: Last updated: 10/21/2008 Cholecystectomy 2006 Hysterectomy  1997 Tonsillectomy  1963 Fractured nose 1988 nose 1990  Family History: Last updated: 10/21/2008 Family History of Alcoholism/Addiction blood relative Family History of Arthritis blood relative breast cancer blood relative lung  cancer blood relative postate cancer, parent, blood relative heart disease, parnet, blood relative stroke, parent, blood relative Family History Hypertension, parent, blood relative Diabetes, parent, blood relative  Social History: Last updated: 10/21/2008 Retired Married Current Smoker Regular exercise-yes  Risk Factors: Exercise: yes (10/21/2008)  Risk Factors: Smoking Status: current (10/21/2008) PMH-FH-SH reviewed for relevance  Review of Systems  The patient denies anorexia, fever, weight loss, chest pain, syncope, dyspnea on exertion, peripheral edema, prolonged cough, hemoptysis, abdominal pain, and depression.    Physical Exam  General:  Well-developed,well-nourished,in no acute distress; alert,appropriate and cooperative throughout examination Mouth:  Oral mucosa and oropharynx without lesions or exudates.  Teeth in good repair. Neck:  No deformities, masses, or tenderness noted. Lungs:  Normal respiratory effort, chest expands symmetrically. Lungs are clear to auscultation, no crackles or wheezes. Heart:  Normal rate and regular rhythm. S1 and S2 normal without gallop, murmur, click, rub or other extra sounds. Extremities:  Full ROM L hip.  Tender over lateral bursa.   No edema. Neurologic:  alert & oriented X3, cranial nerves II-XII intact, and strength normal in all extremities.   Psych:  normally interactive, good eye contact, not depressed appearing, and slightly anxious.     Impression & Recommendations:  Problem # 1:  OPIOID DEPENDENCE (ICD-304.00) tapering off as per HPI.  Problem # 2:  ESSENTIAL HYPERTENSION (ICD-401.9)  Her updated medication list for this problem includes:    Chlorthalidone 25 Mg Tabs (Chlorthalidone) ..... Once daily  Problem # 3:  HIP PAIN (ICD-719.45) suspect burstitis.  Consider injection but at this point she wishes to observe and is considering accupuncture.  Continue Ultram. Her updated medication list for this problem  includes:    Ultram 50 Mg Tabs (Tramadol hcl) .[name]... 2 tabs two times a day    Oxycodone Hcl 5 Mg Tabs (Oxycodone hcl) .[name]... 1-2 by mouth q 6 hours as needed  Complete Medication List: 1)  Omeprazole 20 Mg Cpdr (Omeprazole) .[name].. 1 each day 30 minutes before meal qam 2)  Klor-con M10 10 Meq Cr-tabs (Potassium chloride crys cr) .... One tab two times a day 3)  Levothroid 200 Mcg Tabs (Levothyroxine sodium) .... Once daily 4)  Chlorthalidone 25 Mg Tabs (Chlorthalidone) .... Once daily 5)  Ultram 50 Mg Tabs (Tramadol hcl) .... 2 tabs two times a day 6)  Proair Hfa 108 (90 Base) Mcg/act Aers (Albuterol sulfate) .... 2 puffs every 4-6 hours as needed 7)  Oxycodone Hcl 5 Mg Tabs (Oxycodone hcl) .[name].. 1-2 by mouth q 6 hours as needed 8)  Gabapentin 300 Mg Caps (Gabapentin) .... One tab three times a day  Hypertension Assessment/Plan:      The patient's hypertensive risk group is category B: At least one risk factor (excluding diabetes) with no target organ damage.  Today's blood pressure is 130/90.    Patient Instructions: 1)  Please schedule a follow-up appointment in 3 months .  Prescriptions: PROAIR HFA 108 (90 BASE) MCG/ACT AERS (ALBUTEROL SULFATE) 2 puffs every 4-6 hours as needed  #1 x 2   Entered and Authorized by:   [name] MD   Signed by:   [name] MD on 03/18/2010   Method used:   [facility]        [id number]       [address]       [place], [place]  [address]       Ph: [phone] or [phone]       Fax: [phone]   RxID:   [id number] CHLORTHALIDONE 25 MG TABS (CHLORTHALIDONE) once daily  #90 Tablet x 3   Entered and Authorized by:   [name] MD   Signed by:   [name] MD on 03/18/2010   Method used:   [facility]        [id number]       [address]       [place], [place]  [address]       Ph: [phone] or [phone]       Fax: [phone]   RxID:   [id number] ULTRAM 50 MG  TABS (TRAMADOL HCL) 2 tabs two times a day  #360 Tablet x 5   Entered and Authorized by:   [name] MD   Signed by:   [name] MD on 03/18/2010   Method used:   [facility]        [facility]       [address]       [place], [place]  [address]       Ph: [phone] or [phone]       Fax: [phone]   RxID:   [id number]    Orders Added: 1)  Est. Patient Level IV [[address]]

## 2010-06-22 NOTE — Miscellaneous (Signed)
Summary: Omeprazole Rx  Clinical Lists Changes  Medications: Added new medication of OMEPRAZOLE 20 MG CPDR (OMEPRAZOLE) 1 tablet by mouth two times a day - Signed Rx of OMEPRAZOLE 20 MG CPDR (OMEPRAZOLE) 1 tablet by mouth two times a day;  #60 x 11;  Signed;  Entered by: Alphonsa Gin RN;  Authorized by: Ladene Artist MD [facility];  Method used: [id number] [address], [place], [place]  [address], Ph: [phone] or [phone], Fax: [phone]    Prescriptions: OMEPRAZOLE 20 MG CPDR (OMEPRAZOLE) 1 tablet by mouth two times a day  #60 x 11   Entered by:   Alphonsa Gin RN   Authorized by:   Ladene Artist MD [facility]   Signed by:   Alphonsa Gin RN on 09/25/2009   Method used:   [facility]        [facility]  [address] [address] [facility]       [address]       [place], [place]  [address]       Ph: [phone] or [phone]       Fax: [phone]   RxID:   [id number]

## 2010-06-22 NOTE — Procedures (Signed)
Summary: EGD   EGD  Procedure date:  09/30/2008  Findings:      Location: [place]    Procedures Next Due Date:    EGD: 09/2009  ENDOSCOPY PROCEDURE REPORT  PATIENT:  Kimberly Stein, Kimberly Stein  MR#:  [id number] BIRTHDATE:   Apr 01, 1956, 55 yrs. old   GENDER:   female  ENDOSCOPIST:   Norberto Sorenson T. Fuller Plan, Stein, [facility]    PROCEDURE DATE:  09/30/2008 PROCEDURE:  EGD with biopsy ASA CLASS:   Class II INDICATIONS: small bowel biopsy for follow up of celiac disease   MEDICATIONS:   Fentanyl 100 mcg IV, Versed 10 mg IV TOPICAL ANESTHETIC:   Exactacain Spray  DESCRIPTION OF PROCEDURE:   After the risks benefits and alternatives of the procedure were thoroughly explained, informed consent was obtained.  The LB [id number] endoscope was introduced through the mouth and advanced to the second portion of the duodenum, without limitations.  The instrument was slowly withdrawn as the mucosa was fully examined. <<PROCEDUREIMAGES>>        <<OLD IMAGES>>  The esophagus and gastroesophageal junction were completely normal in appearance.  The stomach was entered and closely examined. The antrum, angularis, and lesser curvature were well visualized, including a retroflexed view of the cardia and fundus. The stomach wall was normally distensable. The scope passed easily through the pylorus into the duodenum. The duodenal bulb was normal in appearance, however the postbulbar duodenum had mildly scalloped folds.  Multiple biopsies were obtained and sent to pathology.  Retroflexed views revealed a hiatal hernia, small.  The scope was then withdrawn from the patient and the procedure completed.  COMPLICATIONS:   None  ENDOSCOPIC IMPRESSION:  1) Normal esophagus  2) Normal stomach  3) Abnormal duodenal folds  4) A small hiatal hernia   RECOMMENDATIONS:  1) anti-reflux regimen  2) PPI qam (omeprazole 14m po qam #30, 11 refills  3) await pathology results  REPEAT EXAM:   EGD in 1 year  for follow up of celiac disease and Colonoscopy in 08/2010 for CRC screening.    MPricilla Riffle SFuller Plan Stein, FMarval Regal   CC: BCarolann Stein Stein     REPORT OF SURGICAL PATHOLOGY   Case #: [id number] Name: Kimberly Stein Office Chart Number:  [id number]  MRN: [id number]: Kimberly Stein DOB/Age  [birth year](Age: 55    Gender: F Date Taken:  09/30/2008 Date Received: 09/30/2008   FINAL DIAGNOSIS   ***MICROSCOPIC EXAMINATION AND DIAGNOSIS***   DUODENAL BIOPSIES: VILLOUS ATROPHY WITH INCREASED INTRAEPITHELIAL LYMPHOCYTES CONSISTENT WITH SPRUE.  SEE COMMENT.   COMMENT There is increased infiltrate including plasma cells in the lamina propria associated with increased intraepithelial lymphocytes.  There is also crypt hyperplasia and patchy, partial villous atrophy.  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  (Kimberly Stein:mw 10/01/08)    mw Date Reported:  10/01/2008     Kimberly Stein *** Electronically Signed Out By Kimberly Stein ***  Oct 01, 2008 MRN: [id number]   [facility]  [address]   Dear Kimberly Stein,  I am pleased to inform you that the biopsies taken during your recent endoscopic examination did not show any evidence of cancer upon pathologic examination.   The biopsies show changes of celiac disease indicating that you are still exposed to gluten. The biopsies are not signicantly different from your prior biopsies. Please review your diet carefully especially for all meals you eat out of your home.  My  nurse will contact you regarding repeating your celiac disease blood tests to see if they are improving.  Continue with the treatment plan as outlined on the day of your exam.  You should have a repeat endoscopic examination for this problem in 1 year.  Please call us if you are having persistent problems or have questions about your condition that have not been fully answered at this time.  Sincerely,   Kimberly Stein [facility]  This letter has been electronically signed by your physician.   This report was created from the original endoscopy report, which was reviewed and signed by the above listed endoscopist.

## 2010-06-22 NOTE — Procedures (Signed)
Summary: Tissue Transglutaminase  Tissue Transglutaminase   Imported By: Marisue Humble CMA 10/08/2008 15:16:56  _____________________________________________________________________  External Attachment:    Type:   Image     Comment:   External Document

## 2010-06-22 NOTE — Letter (Signed)
Summary: Recall Endoscopy Letter  [facility]  [address]   [place], [place] [address]   Phone: [phone]  Fax: [phone]      August 15, 2008 MRN: [id number]   [facility] [place], [place]  [address]   Dear Ms. Dunnavant,   According to your medical record, it is time for you to schedule an Endoscopy. Endoscopic screening is recommended for patients with certain upper digestive tract conditions because of associated increased risk for cancers of the upper digestive system.  This letter has been generated based on the recommendations made at the time of your prior procedure. If you feel that in your particular situation this may no longer apply, please contact our office.  Please call our office at [phone]) to schedule this appointment or to update your records at your earliest convenience.  Thank you for cooperating with [place] to provide you with the very best care possible.   Sincerely,  Norberto Sorenson T. Fuller Plan, M.D.  [facility] Gastroenterology Division [phone]

## 2010-06-22 NOTE — Letter (Signed)
Summary: Results Letter  [facility]  [place], [place] [address]   Phone: [phone]  Fax: [phone]        Sep 23, 2009 MRN: [id number]    [name] [address] [place], [place]  [address]  DOB:  07-07-1955  Dear [name],   Our mutual patient Kimberly Stein is scheduled for an Upper Endoscopy for 09/25/09 to follow up survelillance of Celiac disease.  It is our understanding that she had a cervical fusion in January of this year.  Please advise if medically cleared for EGD this Friday.  Please fax a response to Barb Merino, RN at [phone], and call our office at  [phone] if you have any questions.      Sincerely,     Lucio Edward, MD  This letter has been electronically signed by your physician.

## 2010-06-22 NOTE — Miscellaneous (Signed)
Summary: Lec previsit  Clinical Lists Changes  Observations: Added new observation of ALLERGY REV: Done (09/24/2009 14:01)

## 2010-06-22 NOTE — Assessment & Plan Note (Signed)
Summary: acute med issues//ccm   Vital Signs:  Patient profile:   55 year old female Weight:      150 pounds Temp:     98.0 degrees F oral BP sitting:   150 / 90  (left arm) Cuff size:   regular  Vitals Entered By: Nira Conn LPN (March 09, 4400 9:40 AM)  Serial Vital Signs/Assessments:  Time      Position  BP       Pulse  Resp  Temp     By                     138/90                         [name] MD   History of Present Illness: Patient here to discuss medication issues.  She relates that she has been misusing oxycodone recently and is very open about this. She's been getting this by non- prescription. She went recently for counseling and was referred to behavioral health but had very bad experience there and left. She is basically requesting help in tapering off this medication. She is currently using oxycodone 5 mg 8 tablets daily. No alcohol use and no other illicit drug use. She initially started taking this for some back pain and left hip pain per rheumatologist.  She is accompanied by her sister today. She assures me she is not getting prescriptions from any other providers.  She is refusing inpatient treatment at this time and states she will go "cold [place]" if unable to taper off.  No major depressive issues. She has history of celiac disease, Mnire's disease, hypertension, hypothyroidism, and fibromyalgia.  Still smokes.    Hypertension History:      She denies headache, chest pain, palpitations, dyspnea with exertion, orthopnea, PND, peripheral edema, visual symptoms, neurologic problems, syncope, and side effects from treatment.        Positive major cardiovascular risk factors include hypertension and current tobacco user.  Negative major cardiovascular risk factors include female age less than 44 years old.    Allergies: 1)  Ibuprofen (Ibuprofen)  Past History:  Past Medical History: Last updated: 06/03/2009 Anemia-iron deficiency Colonic polyps,  hx of GERD Arthritis Cancer/malignancy heart murmur migraines Thyroid problems/hypothyroidism celiac disease meniere's disease ? Fibromyalgia  Past Surgical History: Last updated: 10/21/2008 Cholecystectomy 2006 Hysterectomy  1997 Tonsillectomy  1963 Fractured nose 1988 nose 1990  Family History: Last updated: 10/21/2008 Family History of Alcoholism/Addiction blood relative Family History of Arthritis blood relative breast cancer blood relative lung cancer blood relative postate cancer, parent, blood relative heart disease, parnet, blood relative stroke, parent, blood relative Family History Hypertension, parent, blood relative Diabetes, parent, blood relative  Social History: Last updated: 10/21/2008 Retired Married Current Smoker Regular exercise-yes  Risk Factors: Exercise: yes (10/21/2008)  Risk Factors: Smoking Status: current (10/21/2008) PMH-FH-SH reviewed for relevance  Review of Systems  The patient denies anorexia, fever, weight loss, chest pain, syncope, dyspnea on exertion, peripheral edema, prolonged cough, headaches, hemoptysis, abdominal pain, melena, hematochezia, severe indigestion/heartburn, muscle weakness, and depression.    Physical Exam  General:  Well-developed,well-nourished,in no acute distress; alert,appropriate and cooperative throughout examination Head:  Normocephalic and atraumatic without obvious abnormalities. No apparent alopecia or balding. Mouth:  Oral mucosa and oropharynx without lesions or exudates.  Teeth in good repair. Neck:  No deformities, masses, or tenderness noted. Lungs:  Normal respiratory effort, chest expands symmetrically. Lungs are clear to  auscultation, no crackles or wheezes. Heart:  normal rate and regular rhythm.   Extremities:  No clubbing, cyanosis, edema, or deformity noted with normal full range of motion of all joints.   Neurologic:  alert & oriented X3, cranial nerves II-XII intact, strength normal  in all extremities, and gait normal.   Psych:  normally interactive, good eye contact, not depressed appearing, and slightly anxious.     Impression & Recommendations:  Problem # 1:  OPIOID DEPENDENCE (ICD-304.00) Long discussion with pt and sister.  We have agreed to help her taper off Oxycodone with the following stipulations: Drug contract signed. She must only get rx here and we will not continue to refill but will help her taper off. She understands that breaking her contract could lead to dismissal from practice. She agrees to referral to pain management center if unable to tolerate her chronic pain off opioids.  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 Once daily  Complete Medication List: 1)  Omeprazole 20 Mg Cpdr (Omeprazole) .[name].. 1 each day 30 minutes before meal qam 2)  Klor-con M10 10 Meq Cr-tabs (Potassium chloride crys cr) .... One tab two times a day 3)  Levothroid 200 Mcg Tabs (Levothyroxine sodium) .... Once daily 4)  Chlorthalidone 25 Mg Tabs (Chlorthalidone) .... Once daily 5)  Ultram 50 Mg Tabs (Tramadol hcl) .... 2 tabs two times a day 6)  Proair Hfa 108 (90 Base) Mcg/act Aers (Albuterol sulfate) .... Three times a day 7)  Oxycodone Hcl 5 Mg Tabs (Oxycodone hcl) .[name].. 1-2 by mouth q 6 hours as needed 8)  Gabapentin 300 Mg Caps (Gabapentin) .... One tab three times a day  Hypertension Assessment/Plan:      The patient's hypertensive risk group is category B: At least one risk factor (excluding diabetes) with no target organ damage.  Today's blood pressure is 150/90.     Patient Instructions: 1)  Reduce oxycodone to 6 tablets per day for 3-4 days then reduce to 4 tablets per day for 3-4 days then reduce to 2 tablets daily until followup 2)  Schedule followup by end of next week 3)  It is very important that  you not get any pain medications from any other providers. Prescriptions: OXYCODONE HCL 5 MG TABS (OXYCODONE HCL) 1-2 by mouth q 6 hours as needed  #40 x 0   Entered and Authorized by:   [name] MD   Signed by:   [name] MD on 03/09/2010   Method used:   Print then Give to Patient   RxID:   [id number]    Orders Added: 1)  Est. Patient Level IV [94585]

## 2010-06-22 NOTE — Miscellaneous (Signed)
Summary: previsit  Clinical Lists Changes

## 2010-06-22 NOTE — Progress Notes (Signed)
Summary: schedule Celiac panel   ---- Converted from flag ---- ---- 10/01/2008 3:03 PM, Ladene Artist MD [facility] wrote: Pt. with celiac disease. Symptoms better but biopsies not improving. Send a full celiac serology panel. MS ------------------------------  Phone Note Outgoing Call Call back at [facility] Phone [phone]   Call placed by: Barb Merino RN,  Oct 01, 2008 3:51 PM Call placed to: Patient Summary of Call: notified patient that she needs to come for lab to evaulate her Celiac disease.  Patient  states she will come one day this week

## 2010-06-22 NOTE — Assessment & Plan Note (Signed)
Summary: left ankle sore/cant put weight on it/cjr   Vital Signs:  Patient profile:   55 year old female Temp:     97.7 degrees F BP sitting:   120 / 80  (left arm) Cuff size:   regular  Vitals Entered By: Nira Conn LPN (February 20, 8249 9:24 AM) CC: Left foot/ankle pain, onset 4am getting out of bed, denies injury   History of Present Illness: Acute visit. Left ankle pain anteriorly with onset this morning. No definite injury. She does recall bumping her ankle against a box at home last night but denies any pain afterwards. No swelling. No redness. Pain exacerbated by dorsiflexion and plantarflexion of the ankle. Difficulty bearing weight secondary to pain. Taken Ultram without relief. Cannot tolerate nonsteroidals secondary to GI side effects. No history of gout.  Allergies (verified): No Known Drug Allergies  Past History:  Past Medical History: Last updated: 10/21/2008 Anemia-iron deficiency Colonic polyps, hx of GERD Arthritis Cancer/malignancy heart murmur migraines Thyroid problems/hypothyroidism celiac disease meniers disease  Review of Systems      See HPI  Physical Exam  General:  Well-developed,well-nourished,in no acute distress; alert,appropriate and cooperative throughout examination Lungs:  Normal respiratory effort, chest expands symmetrically. Lungs are clear to auscultation, no crackles or wheezes. Heart:  Normal rate and regular rhythm. S1 and S2 normal without gallop, murmur, click, rub or other extra sounds. Extremities:  left ankle reveals no effusion. No warmth and no erythema. No ecchymosis. She has some diffuse poorly localized tenderness left anterior ankle. No bony tenderness. Foot is warm to touch with good capillary refill. Normal distal pulses. Good range of motion ankle but pain with plantarflexion and dorsiflexion.   Impression & Recommendations:  Problem # 1:  ANKLE PAIN (ICD-719.47) Anterior pain. suspect tendinitis.  No evidence  for bony injury or acute inflammatory reaction. Continue icing. Avoid nonsteroidals given her history of side effects. Ace Wrap provided.  Orders: Ace Wraps 3-5 in/yard  ([id number])  Complete Medication List: 1)  Omeprazole 20 Mg Cpdr (Omeprazole) .[name].. 1 each day 30 minutes before meal qam 2)  Klor-con M10 10 Meq Cr-tabs (Potassium chloride crys cr) .... One tab two times a day 3)  Levothroid 200 Mcg Tabs (Levothyroxine sodium) .... Once daily 4)  Chlorthalidone 25 Mg Tabs (Chlorthalidone) .... Once daily 5)  Ultram 50 Mg Tabs (Tramadol hcl) .... 2 tabs two times a day 6)  Proair Hfa 108 (90 Base) Mcg/act Aers (Albuterol sulfate) .... Three times a day 7)  Oxycodone-acetaminophen 5-325 Mg Tabs (Oxycodone-acetaminophen) .... One to two by mouth q 4-6 hours as needed pain  Patient Instructions: 1)  Ice ankle several times daily. 2)  Gentle range of motion and weightbearing as tolerated. Prescriptions: OXYCODONE-ACETAMINOPHEN 5-325 MG TABS (OXYCODONE-ACETAMINOPHEN) one to two by mouth q 4-6 hours as needed pain  #30 x 0   Entered and Authorized by:   [name] MD   Signed by:   [name] MD on 02/20/2009   Method used:   Print then Give to Patient   RxID:   [id number]

## 2010-06-22 NOTE — Letter (Signed)
Summary: [facility]   Imported By: Bubba Hales 11/11/2009 08:12:28  _____________________________________________________________________  External Attachment:    Type:   Image     Comment:   External Document

## 2010-06-22 NOTE — Miscellaneous (Signed)
Summary: Omeprazole Rx in RR/aw  Clinical Lists Changes  Medications: Added new medication of OMEPRAZOLE 20 MG  CPDR (OMEPRAZOLE) 1 each day 30 minutes before meal QAM - Signed Rx of OMEPRAZOLE 20 MG  CPDR (OMEPRAZOLE) 1 each day 30 minutes before meal QAM;  #30 x 11;  Signed;  Entered by: Thurston Pounds RN II;  Authorized by: Ladene Artist MD [facility];  Method used: [id number] [address], [place], [place]  [address], Ph: [phone] or [phone], Fax: [phone] Observations: Added new observation of ALLERGY REV: Done (09/30/2008 11:28) Added new observation of NKA: T (09/30/2008 11:28)    Prescriptions: OMEPRAZOLE 20 MG  CPDR (OMEPRAZOLE) 1 each day 30 minutes before meal QAM  #30 x 11   Entered by:   Thurston Pounds RN II   Authorized by:   Ladene Artist MD [facility]   Signed by:   Thurston Pounds RN II on 09/30/2008   Method used:   [facility]        [id number]       [address]       [place], [place]  [address]       Ph: [phone] or [phone]       Fax: [phone]   RxID:   [id number]

## 2010-06-22 NOTE — Assessment & Plan Note (Signed)
Summary: CONSULT RE: PAIN MANAGEMENT OPTIONS/CJR   Vital Signs:  Patient profile:   55 year old female Temp:     98.8 degrees F oral BP sitting:   150 / 88  (left arm) Cuff size:   regular  Vitals Entered By: Nira Conn LPN (June 12, 5782 4:24 PM) CC: Consultation, pain management options   History of Present Illness: Patient here to discuss her chronic pain issues. Patient relates progressive diffuse muscular pain and arthralgias over a year duration. Recent evaluation for rheumatologist. Diagnosed with fibromyalgia. Placed on gabapentin 300 mg t.i.d. which is helping some. Still has pain 7/10 at times.  Also has cervical neck pain recent MRI revealed abnormal fluid C5-C6 probably related to degenerative arthritis cannot rule out infection. Patient has been scheduled see a neurosurgeon regarding that. She denies any fever.  Her pains are quite diffuse but especially shoulders and lateral hips bilaterally. She is convinced water pain is related to celiac disease. She is on gluten-free diet. She is here to discuss possible pain management options. Previously on Lyrica but apparently did not tolerate. Refuses to take antidepressants. No relief with Ultram or over-the-counter anti-inflammatories. Had side effect with methocarbamol. As exercise regularly past but not recently secondary to pain. Oxycodone has been the only thing this provided very much relief. Taking one every 6 hours. No history of drug misuse.  She had recent extensive lab work which was again reviewed today. Sedimentation rate of 37 but other inflammatory markers unremarkable  Allergies: 1)  Ibuprofen (Ibuprofen)  Past History:  Past Medical History: Last updated: 06/03/2009 Anemia-iron deficiency Colonic polyps, hx of GERD Arthritis Cancer/malignancy heart murmur migraines Thyroid problems/hypothyroidism celiac disease meniere's disease ? Fibromyalgia  Past Surgical History: Last updated:  10/21/2008 Cholecystectomy 2006 Hysterectomy  1997 Tonsillectomy  1963 Fractured nose 1988 nose 1990  Social History: Last updated: 10/21/2008 Retired Married Current Smoker Regular exercise-yes  Review of Systems  The patient denies anorexia, fever, weight loss, chest pain, syncope, dyspnea on exertion, peripheral edema, prolonged cough, headaches, abdominal pain, melena, hematochezia, severe indigestion/heartburn, hematuria, suspicious skin lesions, and depression.    Physical Exam  General:  Well-developed,well-nourished,in no acute distress; alert,appropriate and cooperative throughout examination Mouth:  Oral mucosa and oropharynx without lesions or exudates.  Teeth in good repair. Neck:  No deformities, masses, or tenderness noted. Lungs:  Normal respiratory effort, chest expands symmetrically. Lungs are clear to auscultation, no crackles or wheezes. Heart:  normal rate, regular rhythm, and no murmur.   Extremities:  extremely tender over her greater trochanteric bursa bilaterally. She has multiple classic trigger points tenderness consistent with fibromyalgia. Full range of motion all joints. No erythema or warmth or other markers of inflammation Neurologic:  alert & oriented X3, cranial nerves II-XII intact, and strength normal in all extremities.   Skin:  Intact without suspicious lesions or rashes Cervical Nodes:  No lymphadenopathy noted Psych:  memory intact for recent and remote, normally interactive, good eye contact, and not depressed appearing.     Impression & Recommendations:  Problem # 1:  FIBROMYALGIA (ICD-729.1) long discussion with patient regarding available pain management options. First, we have reiterated that one physician should be in charge of her pain management. At this point she plans to go back to rheumatologist and will defer further pain meds to her rheumatologist until we hear otherwise. Continue gabapentin. Discussed other medications which may  be of benefit including muscle relaxers and Cymbalta. Also discussed importance of regular exercise. Agree to one refill of  oxycodone until she can see rheumatologist Her updated medication list for this problem includes:    Ultram 50 Mg Tabs (Tramadol hcl) .[name]... 2 tabs two times a day    Oxycodone-acetaminophen 5-325 Mg Tabs (Oxycodone-acetaminophen) ..... One to two by mouth q 4-6 hours as needed pain  Problem # 2:  CERVICALGIA (ICD-723.1) degenerative arthritis on recent cervical MRI.  Her updated medication list for this problem includes:    Ultram 50 Mg Tabs (Tramadol hcl) .[name]... 2 tabs two times a day    Oxycodone-acetaminophen 5-325 Mg Tabs (Oxycodone-acetaminophen) ..... One to two by mouth q 4-6 hours as needed pain  Complete Medication List: 1)  Omeprazole 20 Mg Cpdr (Omeprazole) .[name].. 1 each day 30 minutes before meal qam 2)  Klor-con M10 10 Meq Cr-tabs (Potassium chloride crys cr) .... One tab two times a day 3)  Levothroid 200 Mcg Tabs (Levothyroxine sodium) .... Once daily 4)  Chlorthalidone 25 Mg Tabs (Chlorthalidone) .... Once daily 5)  Ultram 50 Mg Tabs (Tramadol hcl) .... 2 tabs two times a day 6)  Proair Hfa 108 (90 Base) Mcg/act Aers (Albuterol sulfate) .... Three times a day 7)  Oxycodone-acetaminophen 5-325 Mg Tabs (Oxycodone-acetaminophen) .... One to two by mouth q 4-6 hours as needed pain  Patient Instructions: 1)  Try to get back to regular exercise. 2)  Followup with Dr. Estanislado Pandy as scheduled Prescriptions: OXYCODONE-ACETAMINOPHEN 5-325 MG TABS (OXYCODONE-ACETAMINOPHEN) one to two by mouth q 4-6 hours as needed pain  #40 x 0   Entered and Authorized by:   [name] MD   Signed by:   [name] MD on 06/12/2009   Method used:   Print then Give to Patient   RxID:   [id number]

## 2010-06-22 NOTE — Letter (Signed)
Summary: Patient [facility] Biopsy Results  [facility]  [place], [place] [address]   Phone: [phone]  Fax: [phone]        Sep 29, 2009 MRN: [id number]    [facility] [address] [place], [place]  [address]    Dear Ms. Axelson,  I am pleased to inform you that the biopsies taken during your recent endoscopic examination did not show any evidence of cancer upon pathologic examination. The biopsies show changes of celiac disease.  Continue with the treatment plan as outlined on the day of your      exam.  You should have a repeat endoscopic examination for this problem              in 1 year.  Please call us if you are having persistent problems or have questions about your condition that have not been fully answered at this time.  Sincerely,  Ladene Artist MD [facility]  This letter has been electronically signed by your physician.  Appended Document: Patient Notice-Endo Biopsy Results letter mailed

## 2010-06-22 NOTE — Progress Notes (Signed)
Summary: BP readings.  Phone Note Call from Patient   Summary of Call: 118/77 Pt's rheumatologist took her BP today, so she wants to hold back on he BP meds and document readings and call us back.  Is this ok? Initial call taken by: [facility] CMA AAMA,  April 07, 2010 4:13 PM  Follow-up for Phone Call        I think would be OK to hold her Accupril for few days and if BP going back up over 140/90 would go back on med. Follow-up by: [name] MD,  April 07, 2010 5:24 PM  Additional Follow-up for Phone Call Additional follow up Details #1::        Pt notified. Additional Follow-up by: Deanna Artis CMA AAMA,  April 08, 2010 9:20 AM

## 2010-06-22 NOTE — Progress Notes (Signed)
Summary: EGD results   Phone Note Call from Patient Call back at [facility] Phone [phone]   Call For: Fuller Plan Initial call taken by: Denice Paradise,  Sep 27, 2007 2:10 PM  Follow-up for Phone Call        returned pt. call , meanwhile today pt has received her path. letter in the mail . read path report and told pt her bxs showed celiac sprue similar to earlier bxs, if she had further questions she would need to ask her dr. . Follow-up by: Rush Barer RN,  Sep 27, 2007 5:41 PM

## 2010-06-22 NOTE — Assessment & Plan Note (Signed)
Summary: NECK PAIN (FIBROMYALGIA ISSUES) // RS   Vital Signs:  Patient profile:   55 year old female Temp:     97.7 degrees F oral BP sitting:   150 / 92  (left arm) Cuff size:   regular  Vitals Entered By: Nira Conn LPN (June 03, 6752 11:49 AM) CC: pain is everwhere Pain Assessment Patient in pain? yes      Intensity: 8 Onset of pain  bad pain several years   History of Present Illness: Patient seen with chief complaint of severe pain.  Recently saw a rheumatologist and had several labs including sedimentation rate of 37. She was diagnosed with probable fibromyalgia. Also sharp right-sided neck pain progressive over 6 months. Radiation down right upper extremity. Frequent numbness right upper extremity and possible weakness as well. Had MRI scan earlier this morning.   Was told that she had a couple of spurs on plain x-ray around C5-C6.  Patient prescribed Neurontin and Robaxin rheumatologist but she has not yet started. She has gotten very little pain relief with over-the-counter medications and very little relief with Ultram. Is requesting refill of oxycodone for severe night pain which is working well and she is tolerated well the past.    Allergies (verified): 1)  Ibuprofen (Ibuprofen)  Past History:  Past Surgical History: Last updated: 10/21/2008 Cholecystectomy 2006 Hysterectomy  1997 Tonsillectomy  1963 Fractured nose 1988 nose 1990  Social History: Last updated: 10/21/2008 Retired Married Current Smoker Regular exercise-yes  Past Medical History: Anemia-iron deficiency Colonic polyps, hx of GERD Arthritis Cancer/malignancy heart murmur migraines Thyroid problems/hypothyroidism celiac disease meniere's disease ? Fibromyalgia  Review of Systems      See HPI  Physical Exam  General:  Well-developed,well-nourished,in no acute distress; alert,appropriate and cooperative throughout examination Neck:  fairly good range of motion. Some  pain with lateral bending to the right and left side. No spinal tenderness. Lungs:  Normal respiratory effort, chest expands symmetrically. Lungs are clear to auscultation, no crackles or wheezes. Heart:  Normal rate and regular rhythm. S1 and S2 normal without gallop, murmur, click, rub or other extra sounds. Extremities:  no evidence for any muscle atrophy. Good distal pulses upper extremities Neurologic:  full strength upper extremities with grip strength. Deep tender reflexes 2+ throughout upper extremities. Normal sensory function to touch.   Impression & Recommendations:  Problem # 1:  CERVICALGIA (ICD-723.1) patient has progressive neck pain with radiculopathy symptoms. This is being evaluated by rheumatologist. We explained we cannot have more than one physician prescribing pain medication. She has not received any control type medications from rheumatologist. We agreed one refill oxycodone but have strongly advised that she try to take the Neurontin which was prescribed for her fibromyalgia pain Her updated medication list for this problem includes:    Ultram 50 Mg Tabs (Tramadol hcl) .[name]... 2 tabs two times a day    Oxycodone-acetaminophen 5-325 Mg Tabs (Oxycodone-acetaminophen) ..... One to two by mouth q 4-6 hours as needed pain  Complete Medication List: 1)  Omeprazole 20 Mg Cpdr (Omeprazole) .[name].. 1 each day 30 minutes before meal qam 2)  Klor-con M10 10 Meq Cr-tabs (Potassium chloride crys cr) .... One tab two times a day 3)  Levothroid 200 Mcg Tabs (Levothyroxine sodium) .... Once daily 4)  Chlorthalidone 25 Mg Tabs (Chlorthalidone) .... Once daily 5)  Ultram 50 Mg Tabs (Tramadol hcl) .... 2 tabs two times a day 6)  Proair Hfa 108 (90 Base) Mcg/act Aers (Albuterol sulfate) .... Three times  a day 7)  Oxycodone-acetaminophen 5-325 Mg Tabs (Oxycodone-acetaminophen) .... One to two by mouth q 4-6 hours as needed pain  Patient Instructions: 1)  Please schedule a follow-up  appointment in 1 month.  Prescriptions: OXYCODONE-ACETAMINOPHEN 5-325 MG TABS (OXYCODONE-ACETAMINOPHEN) one to two by mouth q 4-6 hours as needed pain  #40 x 0   Entered and Authorized by:   [name] MD   Signed by:   [name] MD on 06/03/2009   Method used:   Print then Give to Patient   RxID:   [id number]

## 2010-06-22 NOTE — Assessment & Plan Note (Signed)
Summary: SPRAIN BACK?//SLM   Vital Signs:  Patient profile:   55 year old female Temp:     97.8 degrees F oral BP sitting:   140 / 80  (left arm) Cuff size:   regular  Vitals Entered By: Nira Conn LPN (November 09, 352 6:56 PM) CC: back pain, injured yesterday   History of Present Illness: Patient is seen for back pain. Started yesterday after cleaning out her swimming pool. No specific injury. She was in the one position with back flexion for over one hour and thinks this may be exacerbated. No radiculopathy symptoms. No numbness or weakness. No loss of bladder or bowel control. Took leftover oxycodone which helped. She avoids nonsteroidals because of some stomach difficulties. No relief with Tylenol.  Allergies: 1)  Ibuprofen (Ibuprofen)  Past History:  Past Medical History: Last updated: 06/03/2009 Anemia-iron deficiency Colonic polyps, hx of GERD Arthritis Cancer/malignancy heart murmur migraines Thyroid problems/hypothyroidism celiac disease meniere's disease ? Fibromyalgia  Review of Systems      See HPI  Physical Exam  General:  Well-developed,well-nourished,in no acute distress; alert,appropriate and cooperative throughout examination Lungs:  Normal respiratory effort, chest expands symmetrically. Lungs are clear to auscultation, no crackles or wheezes. Heart:  Normal rate and regular rhythm. S1 and S2 normal without gallop, murmur, click, rub or other extra sounds. Msk:  straight leg raises are negative bilaterally Pulses:  R dorsalis pedis normal and L dorsalis pedis normal.   Extremities:  no edema with good distal foot pulses bilaterally Neurologic:  strength normal in all extremities, sensation intact to light touch, and DTRs symmetrical and normal.     Impression & Recommendations:  Problem # 1:  BACK PAIN, LUMBAR (ICD-724.2) suspect strain. Nonfocal exam.  Consider PT if no better  in 2 weeks.   REviewed appropriate stretches. Her updated  medication list for this problem includes:    Ultram 50 Mg Tabs (Tramadol hcl) .[name]... 2 tabs two times a day    Oxycodone-acetaminophen 5-325 Mg Tabs (Oxycodone-acetaminophen) ..... One to two by mouth q 4-6 hours as needed pain  Complete Medication List: 1)  Omeprazole 20 Mg Cpdr (Omeprazole) .[name].. 1 each day 30 minutes before meal qam 2)  Klor-con M10 10 Meq Cr-tabs (Potassium chloride crys cr) .... One tab two times a day 3)  Levothroid 200 Mcg Tabs (Levothyroxine sodium) .... Once daily 4)  Chlorthalidone 25 Mg Tabs (Chlorthalidone) .... Once daily 5)  Ultram 50 Mg Tabs (Tramadol hcl) .... 2 tabs two times a day 6)  Proair Hfa 108 (90 Base) Mcg/act Aers (Albuterol sulfate) .... Three times a day 7)  Oxycodone-acetaminophen 5-325 Mg Tabs (Oxycodone-acetaminophen) .... One to two by mouth q 4-6 hours as needed pain 8)  Gabapentin 300 Mg Caps (Gabapentin) .... One tab three times a day 9)  Omeprazole 20 Mg Cpdr (Omeprazole) .[name].. 1 tablet by mouth two times a day 10)  Ferrous Sulfate 325 (65 Fe) Mg Tabs (Ferrous sulfate) .[name].. 1 by mouth two times a day with meals  Patient Instructions: 1)  Most patients (90%) with low back pain will improve with time ( 2-6 weeks). Keep active but avoid activities that are painful. Apply moist heat and/or ice to lower back several times a day.  2)  exercise with walking and swimming as tolerated 3)  Touch base In 2 weeks if not improved Prescriptions: OXYCODONE-ACETAMINOPHEN 5-325 MG TABS (OXYCODONE-ACETAMINOPHEN) one to two by mouth q 4-6 hours as needed pain  #40 x 0   Entered  and Authorized by:   [name] MD   Signed by:   [name] MD on 11/09/2009   Method used:   Print then Give to Patient   RxID:   [id number]

## 2010-06-24 NOTE — Progress Notes (Signed)
Summary: Triage   Phone Note Call from Patient Call back at Home Phone [phone]   Caller: Patient Call For: Dr. Fuller Plan Reason for Call: Talk to Nurse Summary of Call: Pt is having really bad stomach pains causing her to double over and trouble standing, started a few days ago wants to be seen ASAP Initial call taken by: Martinique Johnson,  May 25, 2010 3:00 PM  Follow-up for Phone Call        patient reports 2 day hx of abdominal pain that  "comes in waves and doubles me over"  She denies nausea or vomiting, fever, constipation or diarrhea.  Pain came on suddenly.  She denies any recent gluten ingestions.  She is given an appointment with Nicoletta Ba PA for 05/27/10.  She is advised she may need to see her primary care MD in the meantime.   Follow-up by: Barb Merino RN, CGRN,  May 25, 2010 4:01 PM  Additional Follow-up for Phone Call Additional follow up Details #1::        Please work her in with someone, DOD or anyone with openings, this week if AE is not back in the office for the 1/5 appt.  Additional Follow-up by: Ladene Artist MD Marval Regal,  May 25, 2010 10:15 PM    Additional Follow-up for Phone Call Additional follow up Details #2::    Left message for patient to call back Barb Merino RN, CGRN  May 26, 2010 9:48 AM  I called to check on the patient this am, she has canceled the appointment with Nicoletta Ba PA for tomorrow.  She says "no one can help me".  She is encouraged to keep the appointment for tomorrow, she refuses to reschedule with me.  She is asked to call back if there is anything we can help her with. Follow-up by: Barb Merino RN, [place],  May 26, 2010 11:28 AM  Additional Follow-up for Phone Call Additional follow up Details #3:: Details for Additional Follow-up Action Taken: Above noted Additional Follow-up by: Ladene Artist MD Marval Regal,  May 26, 2010 2:06 PM

## 2010-07-19 ENCOUNTER — Other Ambulatory Visit: Payer: Self-pay | Admitting: Family Medicine

## 2010-08-04 LAB — BASIC METABOLIC PANEL
BUN: 16 mg/dL (ref 6–23)
CO2: 31 mEq/L (ref 19–32)
Calcium: 9.6 mg/dL (ref 8.4–10.5)
Chloride: 100 mEq/L (ref 96–112)
Creatinine, Ser: 0.52 mg/dL (ref 0.4–1.2)
GFR calc Af Amer: >60 mL/min (ref >60)
GFR calc non Af Amer: >60 mL/min (ref >60)
Glucose, Bld: 108 mg/dL — ABNORMAL HIGH (ref 70–99)
Potassium: 3.4 mEq/L — ABNORMAL LOW (ref 3.5–5.1)
Sodium: 139 mEq/L (ref 135–145)

## 2010-08-04 LAB — RAPID URINE DRUG SCREEN, HOSP PERFORMED
Amphetamines: NOT DETECTED
Barbiturates: NOT DETECTED
Benzodiazepines: NOT DETECTED
Cocaine: NOT DETECTED
Opiates: POSITIVE — AB
Tetrahydrocannabinol: POSITIVE — AB

## 2010-08-04 LAB — URINALYSIS, ROUTINE W REFLEX MICROSCOPIC
Bilirubin Urine: NEGATIVE
Glucose, UA: NEGATIVE mg/dL
Ketones, ur: NEGATIVE mg/dL
Leukocytes, UA: NEGATIVE
Nitrite: NEGATIVE
Protein, ur: 100 mg/dL — AB
Specific Gravity, Urine: 1.015 (ref 1.005–1.030)
Urobilinogen, UA: 0.2 mg/dL (ref 0.0–1.0)
pH: 6.5 (ref 5.0–8.0)

## 2010-08-04 LAB — CBC
HCT: 41.3 % (ref 36.0–46.0)
Hemoglobin: 14.6 g/dL (ref 12.0–15.0)
MCH: 33.1 pg (ref 26.0–34.0)
MCHC: 35.2 g/dL (ref 30.0–36.0)
MCV: 94 fL (ref 78.0–100.0)
Platelets: 193 10*3/uL (ref 150–400)
RBC: 4.4 MIL/uL (ref 3.87–5.11)
RDW: 13.3 % (ref 11.5–15.5)
WBC: 7.5 10*3/uL (ref 4.0–10.5)

## 2010-08-04 LAB — DIFFERENTIAL
Basophils Absolute: 0 K/uL (ref 0.0–0.1)
Basophils Relative: 1 % (ref 0–1)
Eosinophils Absolute: 0.1 K/uL (ref 0.0–0.7)
Eosinophils Relative: 1 % (ref 0–5)
Lymphocytes Relative: 41 % (ref 12–46)
Lymphs Abs: 3.1 K/uL (ref 0.7–4.0)
Monocytes Absolute: 0.3 K/uL (ref 0.1–1.0)
Monocytes Relative: 4 % (ref 3–12)
Neutro Abs: 3.9 K/uL (ref 1.7–7.7)
Neutrophils Relative %: 53 % (ref 43–77)

## 2010-08-04 LAB — URINE MICROSCOPIC-ADD ON

## 2010-08-04 LAB — ETHANOL: Alcohol, Ethyl (B): 5 mg/dL (ref 0–10)

## 2010-08-12 LAB — PROTIME-INR
INR: 0.94 (ref 0.00–1.49)
Prothrombin Time: 12.5 seconds (ref 11.6–15.2)

## 2010-08-12 LAB — SURGICAL PCR SCREEN
MRSA, PCR: NEGATIVE
Staphylococcus aureus: NEGATIVE

## 2010-08-12 LAB — DIFFERENTIAL
Basophils Absolute: 0 10*3/uL (ref 0.0–0.1)
Basophils Relative: 1 % (ref 0–1)
Eosinophils Absolute: 0.1 10*3/uL (ref 0.0–0.7)
Eosinophils Relative: 2 % (ref 0–5)
Lymphocytes Relative: 55 % — ABNORMAL HIGH (ref 12–46)
Lymphs Abs: 4.1 10*3/uL — ABNORMAL HIGH (ref 0.7–4.0)
Monocytes Absolute: 0.3 10*3/uL (ref 0.1–1.0)
Monocytes Relative: 4 % (ref 3–12)
Neutro Abs: 2.9 10*3/uL (ref 1.7–7.7)
Neutrophils Relative %: 39 % — ABNORMAL LOW (ref 43–77)

## 2010-08-12 LAB — CBC
HCT: 41.1 % (ref 36.0–46.0)
Hemoglobin: 14.1 g/dL (ref 12.0–15.0)
MCHC: 34.2 g/dL (ref 30.0–36.0)
MCV: 94.8 fL (ref 78.0–100.0)
Platelets: 198 10*3/uL (ref 150–400)
RBC: 4.33 MIL/uL (ref 3.87–5.11)
RDW: 12.9 % (ref 11.5–15.5)
WBC: 7.5 10*3/uL (ref 4.0–10.5)

## 2010-08-12 LAB — BASIC METABOLIC PANEL
BUN: 13 mg/dL (ref 6–23)
CO2: 31 mEq/L (ref 19–32)
Calcium: 9.9 mg/dL (ref 8.4–10.5)
Chloride: 102 mEq/L (ref 96–112)
Creatinine, Ser: 0.57 mg/dL (ref 0.4–1.2)
GFR calc Af Amer: >60 mL/min (ref >60)
GFR calc non Af Amer: >60 mL/min (ref >60)
Glucose, Bld: 89 mg/dL (ref 70–99)
Potassium: 4 mEq/L (ref 3.5–5.1)
Sodium: 139 mEq/L (ref 135–145)

## 2010-08-12 LAB — APTT: aPTT: 32 seconds (ref 24–37)

## 2010-09-02 ENCOUNTER — Other Ambulatory Visit: Payer: Self-pay | Admitting: Family Medicine

## 2010-09-17 ENCOUNTER — Telehealth: Payer: Self-pay | Admitting: *Deleted

## 2010-09-17 NOTE — Telephone Encounter (Signed)
Pt would like to increase Toradol dosage and have new prescription called to [facility].

## 2010-09-17 NOTE — Telephone Encounter (Signed)
Called pt and it is Tramadol.

## 2010-09-17 NOTE — Telephone Encounter (Signed)
I am confused by this request.  Toradol is not on her med list and is only used short term (no longer than 5 days) and usually given by injection.  We have not given her oral Toradol.  Does she mean Tramadol?

## 2010-09-19 NOTE — Telephone Encounter (Signed)
May refill tramadol 50 mg 1-2 po q 6 hours prn pain, #120 with one refill.

## 2010-09-21 ENCOUNTER — Telehealth: Payer: Self-pay | Admitting: *Deleted

## 2010-09-21 MED ORDER — TRAMADOL HCL 50 MG PO TABS: ORAL_TABLET | ORAL | Status: DC

## 2010-09-21 NOTE — Telephone Encounter (Signed)
Tramadol sent in, and pt notified.

## 2010-09-21 NOTE — Telephone Encounter (Signed)
Pt did not get her Tramadol.

## 2010-10-08 NOTE — Op Note (Signed)
Kimberly Stein, Kimberly Stein               ACCOUNT NO.:  [id number]   MEDICAL RECORD NO.:  [id number]          PATIENT TYPE:  OIB   LOCATION:  3419                         FACILITY:  [facility]   PHYSICIAN:  Timothy E. Rosana Hoes, M.D. DATE OF BIRTH:  July 30, 1955   DATE OF PROCEDURE:  06/07/2005  DATE OF DISCHARGE:                                 OPERATIVE REPORT   PREOPERATIVE DIAGNOSIS:  Cholecystitis with biliary dyskinesia.   POSTOPERATIVE DIAGNOSIS:  Cholecystitis with biliary dyskinesia.   PROCEDURE:  Laparoscopic cholecystectomy with intraoperative cholangiogram  and lysis of adhesions.   SURGEON:  Dr. Lennie Hummer   ASSISTANT:  Dr. Milana Kidney   ANESTHESIA:  General.   [name] has been seen and followed by Dr. Fuller Plan, Dr. [name], Dr. Dalbert Batman, and myself with known biliary dyskinesia, a low  ejection fraction.  There is no evidence of stones.  Because of the  persistence of her symptoms, she wishes to proceed with surgery, as has been  carefully explained in consultation.   The patient was seen and evaluated this morning.  Her laboratory data are  normal, and she is ready to proceed with surgery.  Her permit is signed.   She is taken to the operating room, placed, supine, and general endotracheal  anesthesia administered.  The abdomen was prepped and draped in the usual  fashion.  Marcaine 0.25% with epinephrine was used throughout prior to each  incision.  An infraumbilical incision made horizontally, the fascia  identified, opened vertically, and the peritoneum entered without  complication.  The Hasson catheter placed and tied in place with a #1  Vicryl.  Abdomen insufflated.  General peritoneoscopy revealed an empty  pelvis, normal mid abdomen, a flaccid gallbladder with multiple adhesions  and, as well, innumerable stringy adhesions between the capsule of the liver  and the peritoneum.  A second 10 mm trocar was placed in the mid  epigastrium, two 5 mm trocars in  the right upper quadrant.  Instruments  inserted and under direct vision, the adhesions over the liver were taken  down sharply.  The gallbladder was grasped, placed on tension, the adhesions  taken down and then attention directed to the base of the gallbladder where  a single cystic duct was identified, dissected free, a small vessel attached  there too was doubly clipped and divided and then a clip placed on the  gallbladder side of the cystic duct.  Cystic duct opened, cholangiogram  catheter passed percutaneously and into the cystic duct remnant and a clip  placed.  Flow was easy with no apparent back pressure.  Then, using real-  time fluoroscopy and half-strength Hypaque, a cholangiogram was carried out  showing quick and rapid filling of the biliary tree and empirically into the  duodenum.  There was a question of a distal tiny stone, and Dr. Purcell Nails was  nice enough to come over to the operating room and look at the real-time  cholangiogram and thought that stones were doubtful, but rather crossover  shadows accounted for the question.  In any case, the clip and catheter were  removed.  The cystic duct was triply clipped and divided.  An ordinary-  appearing cystic artery behind that was dissected free, triply clipped, and  divided.  The gallbladder was then removed from the gallbladder bed without  incident or complication.  It was then passed from the abdomen through the  infraumbilical incision, carefully inspected and opened.  There were  numerous apparent cholesterol globules but no stones.  This was also  submitted to pathology.  Meanwhile, a careful inspection and irrigation of  the wound showed no complications or problems.  Irrigation was clear and  with the counts correct, all CO2 irrigation, instruments, and trocars  removed under direct vision.  Each wound was inspected, and the skin was  closed with 4-0 Monocryl, Steri-Strips, and a dry sterile dressing.  Final   counts correct.  She tolerated it well and was awakened and taken to the  recovery room in good condition.   She will be watched over the day, discharged this evening or in the morning,  and follow as an outpatient.      Timothy E. Rosana Hoes, M.D.  Electronically Signed     TED/MEDQ  D:  06/07/2005  T:  06/07/2005  Job:  [id number]   cc:   [name], M.D.  Fax: [phone]   Pricilla Riffle. Fuller Plan, M.D. [facility]  [address]  [place] [address]

## 2010-10-08 NOTE — Assessment & Plan Note (Signed)
[facility]                         GASTROENTEROLOGY OFFICE NOTE   Kimberly Stein, Kimberly Stein                      MRN:          [id number]  DATE:05/29/2006                            DOB:          04-22-1956    This is a return office visit for celiac disease and GERD.  Her reflux  symptoms are well controlled on omeprazole 20 mg daily.  She states she  has been very careful about following a completely gluten-free diet  since March of 2007.  She feels well and has no gastrointestinal  complaints whatsoever.   Medications listed on the chart - updated and reviewed.   MEDICATION ALLERGIES:  NONE KNOWN.   EXAMINATION:  In no acute distress.  Weight 143.4 pounds, up 2.4 pounds  since her visit in May of 2007.  Blood pressure 100/70, pulse 64 and  regular.  CHEST:  Clear to auscultation bilaterally.  CARDIAC:  Regular rate and rhythm, without murmurs.  ABDOMEN:  Soft and nontender with normoactive bowel sounds, no palpable  organomegaly, masses, or hernias.   ASSESSMENT/PLAN:  1. Celiac disease.  Continue gluten-free diet.  Schedule upper      endoscopy with duodenal biopsies.  Risks, benefits, and      alternatives discussed with the patient, she consents to proceed.      This will be scheduled electively.  2. Gastroesophageal reflux disease.  Symptoms under excellent control.      Continue omeprazole 20 mg p.o. q.a.m. as needed along with anti-      reflux measures.  3. Colorectal cancer screening.  Recall colonoscopy recommended for      April 2012.     Pricilla Riffle. Fuller Plan, MD, [facility]  Electronically Signed    MTS/MedQ  DD: 06/06/2006  DT: 06/07/2006  Job #: [id number]   cc:   [name], M.D.

## 2010-10-13 ENCOUNTER — Telehealth: Payer: Self-pay | Admitting: Family Medicine

## 2010-10-13 NOTE — Telephone Encounter (Signed)
Pt came by office and wants to know why her script for Tramadol HCL 50 mg was changed from a 3 month supply to every 15 days. Pts old script was written for #360 and now has been changed to # 120. Pt wants to go back to #360 (3 month supply). [facility].

## 2010-10-13 NOTE — Telephone Encounter (Signed)
4/27 12 phone note you had approved #120

## 2010-10-13 NOTE — Telephone Encounter (Signed)
09/17/10 phone note you had approved #120 wit 1 refill, 1-2 tabs every 6 hours prn pain.  Debby sent to pt pharmacy.  Please advise on her question

## 2010-10-14 MED ORDER — TRAMADOL HCL 50 MG PO TABS: ORAL_TABLET | ORAL | Status: DC

## 2010-10-14 NOTE — Telephone Encounter (Signed)
Pt informed Rx sent in

## 2010-10-14 NOTE — Telephone Encounter (Signed)
OK to do 3 month supply

## 2010-10-15 ENCOUNTER — Other Ambulatory Visit: Payer: Self-pay | Admitting: Gastroenterology

## 2010-10-15 NOTE — Telephone Encounter (Signed)
NEEDS OFFICE VISIT FOR ANY FURTHER REFILLS! 

## 2010-10-23 LAB — HM PAP SMEAR: HM Pap smear: NORMAL

## 2010-11-17 ENCOUNTER — Other Ambulatory Visit: Payer: Self-pay | Admitting: *Deleted

## 2010-11-17 NOTE — Telephone Encounter (Signed)
VM from pt requesting call be made to her pharmacy to refill Tramadol, [facility], there seems to be a problem, pt reports she is out? Last filled 5/24, may take 1-2 tabs every 6 hours, #360 with 0 refills

## 2010-11-17 NOTE — Telephone Encounter (Signed)
I spoke with [facility] and [facility] [place] [facility] explaining their concern with Tramadol.  She will call then and request fax be sent again as we have not received anyting yet.

## 2010-11-22 ENCOUNTER — Other Ambulatory Visit: Payer: Self-pay | Admitting: Family Medicine

## 2010-11-22 NOTE — Telephone Encounter (Signed)
I spoke with pt, the Pharmacy did fill the med for one month only

## 2010-11-24 ENCOUNTER — Other Ambulatory Visit: Payer: Self-pay | Admitting: Gastroenterology

## 2010-11-25 ENCOUNTER — Other Ambulatory Visit: Payer: Self-pay | Admitting: Gastroenterology

## 2010-11-26 NOTE — Telephone Encounter (Signed)
Left a message for patient stating that I would send a refill to her pharmacy until her appt but looking at her chart she is due for her Upper Endoscopy/Colonoscopy. Told her to call me back and we can cancel her office visit and schedule her for her direct procedures instead.

## 2010-11-26 NOTE — Telephone Encounter (Signed)
Error

## 2010-11-27 ENCOUNTER — Other Ambulatory Visit: Payer: Self-pay | Admitting: Gastroenterology

## 2010-11-29 ENCOUNTER — Telehealth: Payer: Self-pay | Admitting: Gastroenterology

## 2010-11-29 ENCOUNTER — Other Ambulatory Visit: Payer: Self-pay | Admitting: Family Medicine

## 2010-11-29 MED ORDER — OMEPRAZOLE 20 MG PO CPDR: 20.0000 mg | DELAYED_RELEASE_CAPSULE | Freq: Two times a day (BID) | ORAL | Status: DC

## 2010-11-29 NOTE — Telephone Encounter (Signed)
Pt states she is not ready to schedule her procedures at this time. She states she wants to wait until she is feeling better. I told patient that is fine but she does need to either come in for a office visit or have her procedures done. Pt states she will keep her office visit on Wednesday. Prescription sent to the pharmacy.

## 2010-11-29 NOTE — Telephone Encounter (Signed)
See other phone note that was originated on 11/26/10.

## 2010-12-01 ENCOUNTER — Ambulatory Visit: Payer: Self-pay | Admitting: Gastroenterology

## 2010-12-13 ENCOUNTER — Other Ambulatory Visit: Payer: Self-pay | Admitting: Family Medicine

## 2010-12-23 LAB — HM COLONOSCOPY: HM Colonoscopy: NEGATIVE

## 2010-12-28 ENCOUNTER — Other Ambulatory Visit: Payer: Self-pay | Admitting: Gastroenterology

## 2010-12-31 ENCOUNTER — Other Ambulatory Visit: Payer: Self-pay | Admitting: Gastroenterology

## 2010-12-31 MED ORDER — OMEPRAZOLE 20 MG PO CPDR: 20.0000 mg | DELAYED_RELEASE_CAPSULE | Freq: Two times a day (BID) | ORAL | Status: DC

## 2010-12-31 NOTE — Telephone Encounter (Signed)
Sent prescription for one refill but keep appt for any further refills.

## 2011-01-06 ENCOUNTER — Ambulatory Visit (AMBULATORY_SURGERY_CENTER): Admitting: *Deleted

## 2011-01-06 DIAGNOSIS — Z1211 Encounter for screening for malignant neoplasm of colon: Secondary | ICD-10-CM

## 2011-01-06 DIAGNOSIS — K900 Celiac disease: Secondary | ICD-10-CM

## 2011-01-06 MED ORDER — PEG-KCL-NACL-NASULF-NA ASC-C 100 G PO SOLR: 1.0000 | Freq: Once | ORAL | Status: DC

## 2011-01-10 ENCOUNTER — Encounter: Payer: Self-pay | Admitting: *Deleted

## 2011-01-10 ENCOUNTER — Telehealth: Payer: Self-pay | Admitting: Gastroenterology

## 2011-01-10 NOTE — Telephone Encounter (Signed)
Pt called to say that she will not be able to tolerate drinking MoviPrep.  [facility] [place].  Instruction for prep mailed to pt.  She will call to review prep instructions when she receives them in the mail. Ulice Dash

## 2011-01-13 ENCOUNTER — Telehealth: Payer: Self-pay | Admitting: Gastroenterology

## 2011-01-13 NOTE — Telephone Encounter (Signed)
Attempted to call pt at home.  No answer and no i.d. On machine.  No message left

## 2011-01-13 NOTE — Telephone Encounter (Signed)
Instructions re: Suprep reviewed with pt and understanding voiced

## 2011-01-20 ENCOUNTER — Encounter: Payer: Self-pay | Admitting: Gastroenterology

## 2011-01-20 ENCOUNTER — Other Ambulatory Visit: Payer: Self-pay | Admitting: Family Medicine

## 2011-01-21 ENCOUNTER — Telehealth: Payer: Self-pay | Admitting: Gastroenterology

## 2011-01-21 NOTE — Telephone Encounter (Signed)
Pt concerned about sore throat that she has been waking up with in the mornings. She denies any fever. I informed her that I thought she should keep her appointment scheduled for Tuesday. If over the weekend her symptoms worsened or she developed fever she should call and discuss concerns at that time with the on call physician to avoid fees on Tuesday.

## 2011-01-22 DIAGNOSIS — D126 Benign neoplasm of colon, unspecified: Secondary | ICD-10-CM

## 2011-01-22 HISTORY — DX: Benign neoplasm of colon, unspecified: D12.6

## 2011-01-25 ENCOUNTER — Ambulatory Visit (AMBULATORY_SURGERY_CENTER): Admitting: Gastroenterology

## 2011-01-25 ENCOUNTER — Encounter: Payer: Self-pay | Admitting: Gastroenterology

## 2011-01-25 DIAGNOSIS — K900 Celiac disease: Secondary | ICD-10-CM

## 2011-01-25 DIAGNOSIS — K2980 Duodenitis without bleeding: Secondary | ICD-10-CM

## 2011-01-25 DIAGNOSIS — D126 Benign neoplasm of colon, unspecified: Secondary | ICD-10-CM

## 2011-01-25 DIAGNOSIS — Z1211 Encounter for screening for malignant neoplasm of colon: Secondary | ICD-10-CM

## 2011-01-25 MED ORDER — SODIUM CHLORIDE 0.9 % IV SOLN: 500.0000 mL | INTRAVENOUS | Status: DC

## 2011-01-25 NOTE — Progress Notes (Signed)
PRIOR TO PROCEDURE PT C/O DISCOMFORT AT IV SITE.  SITE IS NOT RED OR SWOLLEN AND FLUID IS RUNNING WITHOUT ANY PROBLEMS.  WATCHED THROUGHOUT PROCEDURE.  NO PROBLEMS NOTED POST PROCEDURE.

## 2011-01-25 NOTE — Patient Instructions (Signed)
Please review discharge instructions (blue and green sheets)  Please review information on polyps

## 2011-01-26 ENCOUNTER — Telehealth: Payer: Self-pay

## 2011-01-26 ENCOUNTER — Other Ambulatory Visit: Payer: Self-pay | Admitting: Gastroenterology

## 2011-01-26 NOTE — Telephone Encounter (Signed)
Follow up Call- Patient questions:  Do you have a fever, pain , or abdominal swelling? no Pain Score  0 *  Have you tolerated food without any problems? yes  Have you been able to return to your normal activities? yes  Do you have any questions about your discharge instructions: Diet   no Medications  no Follow up visit  no  Do you have questions or concerns about your Care? no  Actions: * If pain score is 4 or above: No action needed, pain <4.  Per pt "I'm wonderful".  maw

## 2011-01-31 ENCOUNTER — Encounter: Payer: Self-pay | Admitting: Gastroenterology

## 2011-02-16 ENCOUNTER — Telehealth: Payer: Self-pay | Admitting: Family Medicine

## 2011-02-17 ENCOUNTER — Other Ambulatory Visit: Payer: Self-pay | Admitting: Family Medicine

## 2011-02-17 NOTE — Telephone Encounter (Signed)
Refill once. This pt needs office follow up within next month.

## 2011-02-18 NOTE — Telephone Encounter (Signed)
Rx filled once, pt informed she needs return OV

## 2011-02-21 ENCOUNTER — Encounter: Payer: Self-pay | Admitting: Family Medicine

## 2011-02-22 ENCOUNTER — Ambulatory Visit (INDEPENDENT_AMBULATORY_CARE_PROVIDER_SITE_OTHER): Admitting: Family Medicine

## 2011-02-22 ENCOUNTER — Encounter: Payer: Self-pay | Admitting: Family Medicine

## 2011-02-22 VITALS — BP 152/80 | Temp 98.7°F | Wt 152.0 lb

## 2011-02-22 DIAGNOSIS — IMO0001 Reserved for inherently not codable concepts without codable children: Secondary | ICD-10-CM

## 2011-02-22 DIAGNOSIS — H8109 Meniere's disease, unspecified ear: Secondary | ICD-10-CM

## 2011-02-22 DIAGNOSIS — K900 Celiac disease: Secondary | ICD-10-CM

## 2011-02-22 DIAGNOSIS — R030 Elevated blood-pressure reading, without diagnosis of hypertension: Secondary | ICD-10-CM

## 2011-02-22 DIAGNOSIS — E039 Hypothyroidism, unspecified: Secondary | ICD-10-CM

## 2011-02-22 LAB — BASIC METABOLIC PANEL
BUN: 15 mg/dL (ref 6–23)
CO2: 29 mEq/L (ref 19–32)
Calcium: 9.7 mg/dL (ref 8.4–10.5)
Chloride: 99 mEq/L (ref 96–112)
Creatinine, Ser: 0.6 mg/dL (ref 0.4–1.2)
GFR: 112.4 mL/min (ref >60.00)
Glucose, Bld: 90 mg/dL (ref 70–99)
Potassium: 3.5 mEq/L (ref 3.5–5.1)
Sodium: 138 mEq/L (ref 135–145)

## 2011-02-22 LAB — TSH: TSH: 0.23 u[IU]/mL — ABNORMAL LOW (ref 0.35–5.50)

## 2011-02-22 MED ORDER — CHLORTHALIDONE 25 MG PO TABS: 25.0000 mg | ORAL_TABLET | Freq: Every day | ORAL | Status: DC

## 2011-02-22 MED ORDER — TRAMADOL HCL 50 MG PO TABS: 50.0000 mg | ORAL_TABLET | Freq: Four times a day (QID) | ORAL | Status: DC | PRN

## 2011-02-22 MED ORDER — LEVOTHYROXINE SODIUM 200 MCG PO TABS: 200.0000 ug | ORAL_TABLET | Freq: Every day | ORAL | Status: DC

## 2011-02-22 NOTE — Progress Notes (Signed)
  Subjective:    Patient ID: Kimberly Stein, female    DOB: 11/05/1955, 55 y.o.   MRN: [id number]  HPI Medical followup. Kimberly Stein has history of hypothyroidism, celiac disease, fibromyalgia Mnire's disease and history of elevated blood pressure. Takes chlorthalidone mostly for Mnire's disease. Blood pressures recently over 415 systolic. No headaches or dizziness. Compliant with thyroid medication. Levels not checked in over one year. GERD controlled with omeprazole.  Kimberly Stein continues to see rheumatologist regarding her fibromyalgia. Recent colonoscopy which apparently revealed some adenomatous colon polyps. Patient continues to take Ultram for her fibromyalgia and occasional muscle relaxer. Walks for exercise.  Past Medical History  Diagnosis Date  . Allergy     sinus infections  . Anemia   . Arthritis   . Cancer     skin  . GERD (gastroesophageal reflux disease)   . Heart murmur   . Meniere's disease   . Neuromuscular disorder     fibromyalgia  . Thyroid disease     hypothyroid   Past Surgical History  Procedure Date  . Total abdominal hysterectomy   . Cholecystectomy   . Colonoscopy   . Upper gastrointestinal endoscopy   . Dilation and curettage of uterus   . Cervical disc surgery     reports that Kimberly Stein has been smoking.  Kimberly Stein does not have any smokeless tobacco history on file. Kimberly Stein reports that Kimberly Stein does not drink alcohol or use illicit drugs. family history includes Celiac disease in her mother; Diabetes in her mother; Heart disease in her mother; and Prostate cancer in her father.  There is no history of Colon cancer, and Esophageal cancer, and Stomach cancer, . Allergies  Allergen Reactions  . Vicodin (Hydrocodone-Acetaminophen) Itching      Review of Systems  Constitutional: Positive for fatigue. Negative for chills, appetite change and unexpected weight change.  Respiratory: Negative for cough and shortness of breath.   Cardiovascular: Negative for chest pain.    Gastrointestinal: Negative for nausea, vomiting and diarrhea.  Neurological: Negative for dizziness, weakness and headaches.       Objective:   Physical Exam  Constitutional: Kimberly Stein is oriented to person, place, and time. Kimberly Stein appears well-developed and well-nourished.  HENT:  Right Ear: External ear normal.  Left Ear: External ear normal.  Mouth/Throat: Oropharynx is clear and moist.  Neck: Neck supple. No thyromegaly present.  Cardiovascular: Normal rate, regular rhythm and normal heart sounds.   Pulmonary/Chest: Effort normal and breath sounds normal. No respiratory distress. Kimberly Stein has no wheezes. Kimberly Stein has no rales.  Musculoskeletal: Kimberly Stein exhibits no edema.  Lymphadenopathy:    Kimberly Stein has no cervical adenopathy.  Neurological: Kimberly Stein is alert and oriented to person, place, and time. No cranial nerve deficit.  Psychiatric: Kimberly Stein has a normal mood and affect. Her behavior is normal.          Assessment & Plan:  #1 elevated blood pressure. Reassess in 3 months. Bring blood pressure cuff then. Refill chlorthalidone. Check basic metabolic panel #2 hypothyroidism. Check TSH  #3 health maintenance. Flu vaccine recommended but patient declines. #4 history of celiac disease. Patient on gluten-free diet. #5 fibromyalgia. Refilled tramadol

## 2011-02-23 ENCOUNTER — Telehealth: Payer: Self-pay | Admitting: *Deleted

## 2011-02-23 MED ORDER — LEVOTHYROXINE SODIUM 175 MCG PO TABS: 175.0000 ug | ORAL_TABLET | Freq: Every day | ORAL | Status: DC

## 2011-02-23 NOTE — Progress Notes (Signed)
Quick Note:  Pt informed ______ 

## 2011-02-23 NOTE — Telephone Encounter (Signed)
New med ordered

## 2011-03-15 ENCOUNTER — Telehealth: Payer: Self-pay | Admitting: *Deleted

## 2011-03-15 NOTE — Telephone Encounter (Signed)
Patient states that at 02/22/11 OV MD was to send Rx for [facility] [place] [facility]Sig: [2] tabs Q6 hrs. Pt states pharmacy only received 15-day supply Rx for medication-only show historical entry on 02/22/11. Patient request call back.

## 2011-03-16 MED ORDER — TRAMADOL HCL 50 MG PO TABS: ORAL_TABLET | ORAL | Status: DC

## 2011-03-16 NOTE — Telephone Encounter (Signed)
#  240 with 5 refills given on 02-22-2011.  I spoke with pharmacist and because sig: was 1 tab every 6 hours prn pain, they gave her 120. Pt is due for more meds on 10/26.  I will send a new Rx with sig: 2 tabs every 6 hours

## 2011-03-18 ENCOUNTER — Telehealth: Payer: Self-pay | Admitting: *Deleted

## 2011-03-18 NOTE — Telephone Encounter (Signed)
Pt would like Izora Gala to call to discuss rx

## 2011-03-18 NOTE — Telephone Encounter (Signed)
There seems to be confusion with regards to the insurance and pt pharmacy with refills of tramadol.  Pt will schedule return OV

## 2011-03-23 ENCOUNTER — Other Ambulatory Visit: Payer: Self-pay | Admitting: Dermatology

## 2011-05-07 ENCOUNTER — Other Ambulatory Visit: Payer: Self-pay | Admitting: Family Medicine

## 2011-05-18 ENCOUNTER — Other Ambulatory Visit: Payer: Self-pay | Admitting: Gastroenterology

## 2011-05-25 ENCOUNTER — Encounter: Payer: Self-pay | Admitting: Family Medicine

## 2011-05-25 ENCOUNTER — Ambulatory Visit (INDEPENDENT_AMBULATORY_CARE_PROVIDER_SITE_OTHER): Admitting: Family Medicine

## 2011-05-25 DIAGNOSIS — E039 Hypothyroidism, unspecified: Secondary | ICD-10-CM

## 2011-05-25 DIAGNOSIS — I10 Essential (primary) hypertension: Secondary | ICD-10-CM

## 2011-05-25 LAB — TSH: TSH: 1.24 u[IU]/mL (ref 0.35–5.50)

## 2011-05-25 MED ORDER — LOSARTAN POTASSIUM 50 MG PO TABS: 50.0000 mg | ORAL_TABLET | Freq: Every day | ORAL | Status: DC

## 2011-05-25 NOTE — Progress Notes (Signed)
  Subjective:    Patient ID: Kimberly Stein, female    DOB: 04-Sep-1955, 56 y.o.   MRN: [id number]  HPI  Medical followup. Patient has elevated blood pressure. Takes chlorthalidone. Blood pressures consistently running around 967 to 893 systolic by home readings. No headaches or dizziness. No peripheral edema issues. She has unfortunately gained about 7 more pounds since last visit. Has had difficulty exercising secondary to some arthritis issues.  Recently diagnosed with gout per rheumatologist. Taking allopurinol and colchicine.  Patient has hypothyroidism. Recent TSH was too low and we reduced her thyroid medication. Needs repeat labs at this time. Compliant with therapy. No palpitations.  No diarrhea.  Has had some weight gain.   Review of Systems  Constitutional: Negative for activity change and appetite change.  Eyes: Negative for visual disturbance.  Respiratory: Negative for cough and shortness of breath.   Cardiovascular: Negative for chest pain, palpitations and leg swelling.  Gastrointestinal: Negative for abdominal pain.  Musculoskeletal: Positive for arthralgias.  Neurological: Negative for dizziness, syncope and weakness.  Hematological: Negative for adenopathy.  Psychiatric/Behavioral: Negative for dysphoric mood.       Objective:   Physical Exam  Constitutional: She appears well-developed and well-nourished. No distress.  Neck: Neck supple. No thyromegaly present.  Cardiovascular: Normal rate and regular rhythm.   Pulmonary/Chest: Effort normal and breath sounds normal. No respiratory distress. She has no wheezes. She has no rales.  Musculoskeletal: She exhibits no edema.  Neurological: She is alert.  Psychiatric: She has a normal mood and affect. Her behavior is normal.          Assessment & Plan:  #1 hypothyroidism. Recently over replaced. Recheck TSH today #2 hypertension poorly controlled. Continue chlorthalidone. Add losartan 50 mg daily and reassess  blood pressure within 2 months. Work on weight loss.

## 2011-05-26 ENCOUNTER — Other Ambulatory Visit: Payer: Self-pay | Admitting: Family Medicine

## 2011-05-26 NOTE — Progress Notes (Signed)
Quick Note:  Pt aware ______ 

## 2011-06-22 ENCOUNTER — Other Ambulatory Visit: Payer: Self-pay | Admitting: Family Medicine

## 2011-06-28 ENCOUNTER — Other Ambulatory Visit: Payer: Self-pay | Admitting: Orthopaedic Surgery

## 2011-06-28 DIAGNOSIS — M25559 Pain in unspecified hip: Secondary | ICD-10-CM

## 2011-06-29 ENCOUNTER — Ambulatory Visit
Admission: RE | Admit: 2011-06-29 | Discharge: 2011-06-29 | Disposition: A | Discharge status: Home / Self Care | Source: Ambulatory Visit | Attending: Orthopaedic Surgery | Admitting: Orthopaedic Surgery

## 2011-06-29 DIAGNOSIS — M25559 Pain in unspecified hip: Secondary | ICD-10-CM

## 2011-07-18 ENCOUNTER — Other Ambulatory Visit: Payer: Self-pay | Admitting: Neurological Surgery

## 2011-07-18 DIAGNOSIS — M545 Low back pain, unspecified: Secondary | ICD-10-CM

## 2011-07-26 ENCOUNTER — Ambulatory Visit
Admission: RE | Admit: 2011-07-26 | Discharge: 2011-07-26 | Disposition: A | Discharge status: Home / Self Care | Source: Ambulatory Visit | Attending: Neurological Surgery | Admitting: Neurological Surgery

## 2011-07-26 DIAGNOSIS — M545 Low back pain, unspecified: Secondary | ICD-10-CM

## 2011-07-27 ENCOUNTER — Encounter: Payer: Self-pay | Admitting: Family Medicine

## 2011-07-27 ENCOUNTER — Ambulatory Visit (INDEPENDENT_AMBULATORY_CARE_PROVIDER_SITE_OTHER): Admitting: Family Medicine

## 2011-07-27 VITALS — BP 120/78 | Temp 98.2°F | Wt 158.0 lb

## 2011-07-27 DIAGNOSIS — I10 Essential (primary) hypertension: Secondary | ICD-10-CM

## 2011-07-27 NOTE — Progress Notes (Signed)
  Subjective:    Patient ID: Kimberly Stein, female    DOB: [birth year], 56 y.o.   MRN: [id number]  HPI  Followup hypertension. Added losartan last visit blood pressures been greatly improved. She has some increased energy. Fewer headaches. No side effects from medication. Compliant with therapy. Also remains on chlorthalidone. She is not monitoring blood pressure at home.   Review of Systems  Constitutional: Negative for fatigue.  Eyes: Negative for visual disturbance.  Respiratory: Negative for cough, chest tightness, shortness of breath and wheezing.   Cardiovascular: Negative for chest pain, palpitations and leg swelling.  Neurological: Negative for dizziness, seizures, syncope, weakness, light-headedness and headaches.       Objective:   Physical Exam  Constitutional: She appears well-developed and well-nourished.  Neck: Neck supple. No thyromegaly present.  Cardiovascular: Normal rate and regular rhythm.   Pulmonary/Chest: Effort normal and breath sounds normal. No respiratory distress. She has no wheezes. She has no rales.  Musculoskeletal: She exhibits no edema.  Lymphadenopathy:    She has no cervical adenopathy.          Assessment & Plan:  Hypertension. Improved. Continue current medications. Routine followup 6 months. Continue to work on weight loss and regular aerobic exercise

## 2011-07-27 NOTE — Patient Instructions (Signed)
Continue to work on weight loss

## 2011-08-04 ENCOUNTER — Other Ambulatory Visit: Payer: Self-pay | Admitting: Family Medicine

## 2011-08-27 ENCOUNTER — Other Ambulatory Visit: Payer: Self-pay | Admitting: Family Medicine

## 2011-09-14 ENCOUNTER — Other Ambulatory Visit: Payer: Self-pay | Admitting: Family Medicine

## 2011-10-01 IMAGING — CR DG CERVICAL SPINE 1V
1 series · 1 of 1 positions shown · non-contrast
Comparison: Intraoperative radiographs 07/16/2009.

CLINICAL DATA: 53-year-old female status post ACDF.

CERVICAL SPINE - 1 VIEW

[view not recorded]
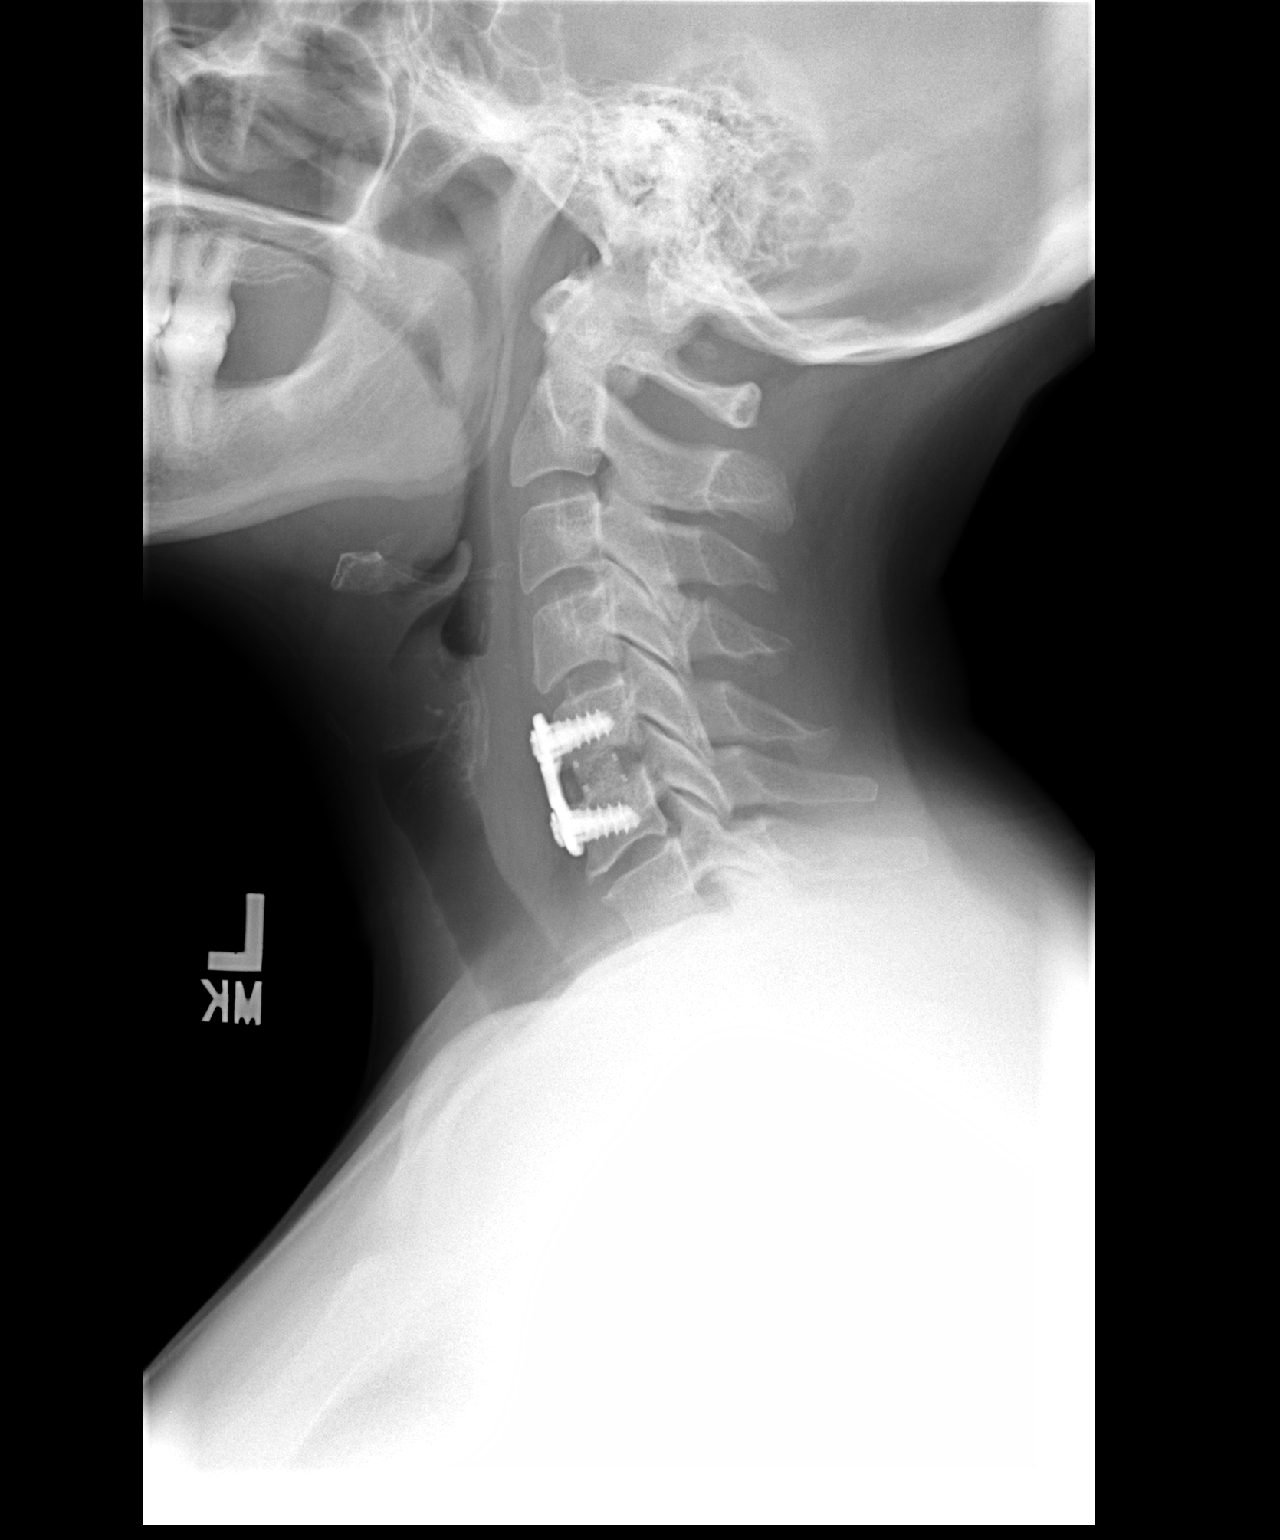

[1 of 1 positions shown; findings below may reference images not displayed]

FINDINGS: Sequelae of C5-C6 ACDF.  Hardware appears stable and
intact.  Prevertebral soft tissues are within normal limits.
Relatively preserved lordosis. No acute osseous abnormality
identified.
IMPRESSION: C5-C6 ACDF.  No adverse features identified.

## 2011-11-08 ENCOUNTER — Other Ambulatory Visit: Payer: Self-pay | Admitting: Family Medicine

## 2011-11-11 ENCOUNTER — Other Ambulatory Visit: Payer: Self-pay | Admitting: Dermatology

## 2012-01-19 ENCOUNTER — Other Ambulatory Visit: Payer: Self-pay | Admitting: Family Medicine

## 2012-01-19 DIAGNOSIS — Z1231 Encounter for screening mammogram for malignant neoplasm of breast: Secondary | ICD-10-CM

## 2012-01-23 ENCOUNTER — Other Ambulatory Visit: Payer: Self-pay | Admitting: Family Medicine

## 2012-01-27 ENCOUNTER — Encounter: Payer: Self-pay | Admitting: Family Medicine

## 2012-01-27 ENCOUNTER — Ambulatory Visit (INDEPENDENT_AMBULATORY_CARE_PROVIDER_SITE_OTHER): Admitting: Family Medicine

## 2012-01-27 VITALS — BP 120/72 | Temp 97.8°F | Wt 152.0 lb

## 2012-01-27 DIAGNOSIS — K900 Celiac disease: Secondary | ICD-10-CM

## 2012-01-27 DIAGNOSIS — I10 Essential (primary) hypertension: Secondary | ICD-10-CM

## 2012-01-27 DIAGNOSIS — E039 Hypothyroidism, unspecified: Secondary | ICD-10-CM

## 2012-01-27 DIAGNOSIS — M899 Disorder of bone, unspecified: Secondary | ICD-10-CM

## 2012-01-27 DIAGNOSIS — M8580 Other specified disorders of bone density and structure, unspecified site: Secondary | ICD-10-CM

## 2012-01-27 DIAGNOSIS — K219 Gastro-esophageal reflux disease without esophagitis: Secondary | ICD-10-CM

## 2012-01-27 NOTE — Progress Notes (Signed)
  Subjective:    Patient ID: Kimberly Stein, female    DOB: September 06, 1955, 56 y.o.   MRN: [id number]  HPI  Patient seen for medical followup. She has history of hypertension, celiac disease, GERD, and Mnire's disease, and hypothyroidism. Done extremely well recently currently exercising about 3 miles per day. Compliant with medications. She's felt much better since gluten-free diet. Had recent upper endoscopy which was reportedly normal. Much less abdominal pain.  History of osteopenia. Last DEXA scan over 2 years ago. Takes calcium and vitamin D. Walks for exercise.  Past Medical History  Diagnosis Date  . Allergy     sinus infections  . Anemia   . Arthritis   . Cancer     skin  . GERD (gastroesophageal reflux disease)   . Heart murmur   . Meniere's disease   . Neuromuscular disorder     fibromyalgia  . Thyroid disease     hypothyroid   Past Surgical History  Procedure Date  . Total abdominal hysterectomy   . Cholecystectomy   . Colonoscopy   . Upper gastrointestinal endoscopy   . Dilation and curettage of uterus   . Cervical disc surgery     reports that she has been smoking.  She does not have any smokeless tobacco history on file. She reports that she does not drink alcohol or use illicit drugs. family history includes Celiac disease in her mother; Diabetes in her mother; Heart disease in her mother; and Prostate cancer in her father.  There is no history of Colon cancer, and Esophageal cancer, and Stomach cancer, . Allergies  Allergen Reactions  . Vicodin (Hydrocodone-Acetaminophen) Itching      Review of Systems  Constitutional: Negative for fever, chills, appetite change and unexpected weight change.  Respiratory: Negative for cough and shortness of breath.   Cardiovascular: Negative for chest pain, palpitations and leg swelling.  Gastrointestinal: Negative for nausea, vomiting, abdominal pain and diarrhea.  Genitourinary: Negative for dysuria.         Objective:   Physical Exam  Constitutional: She appears well-developed and well-nourished.  Neck: Neck supple. No thyromegaly present.  Cardiovascular: Normal rate and regular rhythm.   Pulmonary/Chest: Effort normal and breath sounds normal. No respiratory distress. She has no wheezes. She has no rales.  Musculoskeletal: She exhibits no edema.  Lymphadenopathy:    She has no cervical adenopathy.          Assessment & Plan:  #1 history of osteopenia. Continue calcium and vitamin D and weightbearing exercise. Schedule repeat DEXA scan  #2 hypertension. Stable. Continue current medication #3 history of celiac disease symptomatically improved on gluten-free diet  #4 hypothyroidism. Plan repeat TSH and followup in 6 months

## 2012-01-28 DIAGNOSIS — M8580 Other specified disorders of bone density and structure, unspecified site: Secondary | ICD-10-CM | POA: Insufficient documentation

## 2012-01-30 ENCOUNTER — Other Ambulatory Visit: Payer: Self-pay | Admitting: Gastroenterology

## 2012-01-30 NOTE — Telephone Encounter (Signed)
NEEDS OFFICE VISIT FOR ANY FURTHER REFILLS! 

## 2012-02-06 ENCOUNTER — Ambulatory Visit (HOSPITAL_COMMUNITY)
Admission: RE | Admit: 2012-02-06 | Discharge: 2012-02-06 | Disposition: A | Discharge status: Home / Self Care | Source: Ambulatory Visit | Attending: Family Medicine | Admitting: Family Medicine

## 2012-02-06 DIAGNOSIS — Z1231 Encounter for screening mammogram for malignant neoplasm of breast: Secondary | ICD-10-CM | POA: Insufficient documentation

## 2012-02-09 ENCOUNTER — Other Ambulatory Visit: Payer: Self-pay | Admitting: Family Medicine

## 2012-02-20 ENCOUNTER — Other Ambulatory Visit: Payer: Self-pay | Admitting: Family Medicine

## 2012-02-28 LAB — HM DEXA SCAN: HM Dexa Scan: NORMAL

## 2012-03-01 ENCOUNTER — Encounter: Payer: Self-pay | Admitting: Family Medicine

## 2012-03-12 ENCOUNTER — Other Ambulatory Visit: Payer: Self-pay | Admitting: Gastroenterology

## 2012-03-14 ENCOUNTER — Other Ambulatory Visit: Payer: Self-pay | Admitting: Internal Medicine

## 2012-03-15 NOTE — Telephone Encounter (Signed)
Dr. Elease Hashimoto pt

## 2012-03-19 ENCOUNTER — Telehealth: Payer: Self-pay | Admitting: Gastroenterology

## 2012-03-19 ENCOUNTER — Encounter: Payer: Self-pay | Admitting: Gastroenterology

## 2012-03-19 MED ORDER — OMEPRAZOLE 20 MG PO CPDR: 20.0000 mg | DELAYED_RELEASE_CAPSULE | Freq: Two times a day (BID) | ORAL | Status: DC

## 2012-03-19 NOTE — Telephone Encounter (Signed)
Prescription sent to the pharmacy and notified to keep appt for any further refills.

## 2012-04-11 ENCOUNTER — Encounter: Payer: Self-pay | Admitting: Gastroenterology

## 2012-04-11 ENCOUNTER — Ambulatory Visit (INDEPENDENT_AMBULATORY_CARE_PROVIDER_SITE_OTHER): Admitting: Gastroenterology

## 2012-04-11 VITALS — BP 122/80 | HR 89 | Ht 63.0 in | Wt 146.4 lb

## 2012-04-11 DIAGNOSIS — K900 Celiac disease: Secondary | ICD-10-CM

## 2012-04-11 DIAGNOSIS — K219 Gastro-esophageal reflux disease without esophagitis: Secondary | ICD-10-CM

## 2012-04-11 MED ORDER — OMEPRAZOLE 20 MG PO CPDR: 20.0000 mg | DELAYED_RELEASE_CAPSULE | Freq: Two times a day (BID) | ORAL | Status: DC

## 2012-04-11 NOTE — Patient Instructions (Signed)
We have sent the following medications to your pharmacy for you to pick up at your convenience:  Omeprazole  

## 2012-04-11 NOTE — Progress Notes (Signed)
History of Present Illness: This is a 56 year old female with a history of celiac disease and GERD. Her reflux symptoms are under excellent control. Duodenal biopsies from September 2012 showed no evidence of celiac disease. She states she continues to closely follow a gluten-free diet and she feels very well.  Current Medications, Allergies, Past Medical History, Past Surgical History, Family History and Social History were reviewed in [employer] record.  Physical Exam: General: Well developed , well nourished, no acute distress Head: Normocephalic and atraumatic Eyes:  sclerae anicteric, EOMI Ears: Normal auditory acuity Mouth: No deformity or lesions Lungs: Clear throughout to auscultation Heart: Regular rate and rhythm; no murmurs, rubs or bruits Abdomen: Soft, non tender and non distended. No masses, hepatosplenomegaly or hernias noted. Normal Bowel sounds Musculoskeletal: Symmetrical with no gross deformities  Pulses:  Normal pulses noted Extremities: No clubbing, cyanosis, edema or deformities noted Neurological: Alert oriented x 4, grossly nonfocal Psychological:  Alert and cooperative. Normal mood and affect  Assessment and Recommendations:  1. Celiac disease. Well controlled. Maintain gluten-free diet. Repeat celiac antibodies in 1-2 years. Consider followup endoscopy at the time of her next colonoscopy.  2. GERD. Continue Nexium 40 mg daily and standard antireflux measures.  3. Personal history of tubulovillous adenomatous colon polyp. Surveillance colonoscopy recommended September 2015.

## 2012-04-30 ENCOUNTER — Other Ambulatory Visit: Payer: Self-pay | Admitting: Family Medicine

## 2012-05-08 ENCOUNTER — Other Ambulatory Visit: Payer: Self-pay | Admitting: Family Medicine

## 2012-05-18 ENCOUNTER — Other Ambulatory Visit: Payer: Self-pay | Admitting: Family Medicine

## 2012-07-09 ENCOUNTER — Telehealth: Payer: Self-pay | Admitting: Family Medicine

## 2012-07-09 NOTE — Telephone Encounter (Signed)
Patient Information:  Caller Name: Belisa  Phone: [phone]  Patient: Kimberly Stein, Kimberly Stein  Gender: Female  DOB: 05-14-1956  Age: 57 Years  PCP: [name] [facility])  Office Follow Up:  Does the office need to follow up with this patient?: No  Instructions For The Office: N/A  RN Note:  Called to notify MD of recent low potassium level. No current problems. Has appointment for 07/26/12 at 1100 with Dr Elease Hashimoto.  Instructed to call if palpitations, > muscle cramps, or diarrhea/vomiting.  Symptoms  Reason For Call & Symptoms: Called regarding low patassium noted by Rheumtologist approximately 3 weeks ago.  Was told to call PCP. On fluid pill and Potassium for Meniere's Disease.  Notes more muscle cramps in toes at times.  Reviewed Health History In EMR: Yes  Reviewed Medications In EMR: Yes  Reviewed Allergies In EMR: Yes  Reviewed Surgeries / Procedures: Yes  Date of Onset of Symptoms: 06/18/2012  Treatments Tried: takes extra potassium occasionally  Treatments Tried Worked: Yes  Guideline(s) Used:  No Protocol Available - Information Only  Disposition Per Guideline:   Home Care  Reason For Disposition Reached:   Information only question and nurse able to answer  Advice Given:  N/A

## 2012-07-21 ENCOUNTER — Other Ambulatory Visit: Payer: Self-pay | Admitting: Family Medicine

## 2012-07-26 ENCOUNTER — Ambulatory Visit (INDEPENDENT_AMBULATORY_CARE_PROVIDER_SITE_OTHER): Admitting: Family Medicine

## 2012-07-26 ENCOUNTER — Encounter: Payer: Self-pay | Admitting: Family Medicine

## 2012-07-26 VITALS — BP 120/80 | Temp 98.2°F | Wt 153.0 lb

## 2012-07-26 DIAGNOSIS — E039 Hypothyroidism, unspecified: Secondary | ICD-10-CM

## 2012-07-26 DIAGNOSIS — L639 Alopecia areata, unspecified: Secondary | ICD-10-CM

## 2012-07-26 DIAGNOSIS — E876 Hypokalemia: Secondary | ICD-10-CM

## 2012-07-26 LAB — BASIC METABOLIC PANEL
BUN: 16 mg/dL (ref 6–23)
CO2: 28 mEq/L (ref 19–32)
Calcium: 10 mg/dL (ref 8.4–10.5)
Chloride: 98 mEq/L (ref 96–112)
Creatinine, Ser: 0.6 mg/dL (ref 0.4–1.2)
GFR: 118.76 mL/min (ref >60.00)
Glucose, Bld: 79 mg/dL (ref 70–99)
Potassium: 4.5 mEq/L (ref 3.5–5.1)
Sodium: 137 mEq/L (ref 135–145)

## 2012-07-26 LAB — TSH: TSH: 0.34 u[IU]/mL — ABNORMAL LOW (ref 0.35–5.50)

## 2012-07-26 NOTE — Progress Notes (Signed)
  Subjective:    Patient ID: Kimberly Stein, female    DOB: 1956-05-16, 57 y.o.   MRN: [id number]  HPI Medical follow up.  Chronic problems include history of hypothyroidism, iron deficiency anemia, Mnire's disease, hypertension, GERD, celiac disease, fibromyalgia. Compliant with Synthroid. Has recently had some diffuse hair loss.  Takes chlorthalidone for Mnire's disease. Reportedly had low potassium early February in labs per rheumatology. We do not have those lab results. Patient double up her potassium to 20 mEq daily No muscle cramps.  Patient recently noted circumferential hair loss right parietal region. No scaling.  Past Medical History  Diagnosis Date  . Allergy     sinus infections  . Anemia   . Arthritis   . Cancer     skin  . GERD (gastroesophageal reflux disease)   . Heart murmur   . Meniere's disease   . Neuromuscular disorder     fibromyalgia  . Thyroid disease     hypothyroid  . 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 Total abdominal hysterectomy    . Cholecystectomy    . Colonoscopy    . Upper gastrointestinal endoscopy    . Dilation and curettage of uterus    . Cervical disc surgery      reports that she has been smoking Cigarettes.  She has been smoking about 1.00 pack per day. She has never used smokeless tobacco. She reports that she does not drink alcohol or use illicit drugs. family history includes Celiac disease in her mother; Diabetes in her mother; Heart disease in her mother; and Prostate cancer in her father.  There is no history of Colon cancer, and Esophageal cancer, and Stomach cancer, . Allergies  Allergen Reactions  . Vicodin (Hydrocodone-Acetaminophen) Itching      Review of Systems  Constitutional: Negative for fever, chills, fatigue and unexpected weight change.  Eyes: Negative for visual disturbance.  Respiratory: Negative for cough, chest tightness, shortness of breath and  wheezing.   Cardiovascular: Negative for chest pain, palpitations and leg swelling.  Gastrointestinal: Negative for abdominal pain.  Neurological: Negative for dizziness, seizures, syncope, weakness, light-headedness and headaches.       Objective:   Physical Exam  Constitutional: She appears well-developed and well-nourished.  Neck: Neck supple. No thyromegaly present.  Cardiovascular: Normal rate and regular rhythm.   Pulmonary/Chest: Effort normal and breath sounds normal. No respiratory distress. She has no wheezes. She has no rales.  Musculoskeletal: She exhibits no edema.  Skin:  Right parietal scalp reveals approximately 2 cm diameter area of hair loss. No broken hair shafts. Nonscaly.          Assessment & Plan:  #1 hypothyroidism. 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 Explained this is autoimmune and frequently difficult to treat. She will consider consultation with dermatologist #3 recent reported hypokalemia. Check basic metabolic panel. Hypokalemia related likely to chlorthalidone

## 2012-07-26 NOTE — Patient Instructions (Signed)
Alopecia Areata Alopecia areata is a self-destructing (autoimmune) disease that results in the loss of hair. In this condition your body's immune system attacks the hair follicle. The hair follicle is responsible for growing hair. Hair loss can occur on the scalp and other parts of the body. It usually starts as one or more small, round, smooth patches of hair loss. It occurs in males and females of all ages and races, but usually starts before age 57. The scalp is the most commonly affected area, but the beard or any hair-bearing site can be affected. This type of hair loss does not leave scars where the hair was lost.  Many people with alopecia areata only have a few patches of hair loss. In others, extensive patchy hair loss occurs. In a few people, all scalp hair is lost (alopecia totalis), or hair is lost from the entire scalp and body (alopecia universalis). No matter how widespread the hair loss, the hair follicles remain alive and are ready to resume normal hair production whenever they receive the correct signal. Hair re-growth may occur without treatment and can even restart after years of hair loss.  CAUSES  It is thought that something triggers the immune system to stop hair growth. It is not always known what the cause is. Some people have genetic markers that can increase the chance of developing alopecia areata. Alopecia areata often occurs in families whose members have had:  Asthma.  Hay fever.  Atopic eczema.  Some autoimmune diseases may also be a trigger, such as:  Thyroid disease.  Diabetes.  Rheumatoid arthritis.  Lupus erythematosus.  Vitiligo.  Pernicious anemia.  Addison's disease. OTHER SYMPTOMS In some people, the nail beds may develop rows of tiny dents (stippling) or the nail beds can become distorted. Other than the hair and nail beds, no other body part is affected.  PROGNOSIS  Alopecia areata is not medically disabling. People with alopecia areata are  usually in excellent health. Hair loss can be emotionally difficult. The [place] has resources available to help individuals and families with alopecia areata. Their goal is to help people with the condition live full, productive lives. There are many successful, well-adjusted, contented people living with Alopecia areata. Alopecia areata can be overcome with:  A positive self image.  Sound medical facts.  The support of others, such as:  Sometimes professional counseling is helpful to develop one's self-confidence and positive self-image. TREATMENT  There is no cure for alopecia areata. There are several available treatments. Treatments are most effective in milder cases. No treatment is effective for everyone. Choice of treatment depends mainly on a person's age and the extent of their hair loss. Alopecia areata occurs in two forms:   A mild patchy form where less than 50 percent of scalp hair is lost.  An extensive form where greater than 50 percent of scalp hair is lost. These two forms of alopecia areata behave quite differently, and the choice of treatment depends on which form is present. Current treatments do not turn alopecia areata off. They can stimulate the hair follicle to produce hair.  Some medications used to treat mild cases include:  Cortisone injections. The most common treatment is the injection of cortisone into the bare skin patches. The injections are usually given by a caregiver specializing in skin issues (dermatologist). This caregiver will use a tiny needle to give multiple injections into the skin in and around the bare patches. The injections are repeated once a month.  If new hair growth occurs, it is usually visible within 4 weeks. Treatment does not prevent new patches of hair loss from developing. There are few side effects from local cortisone injections. Occasionally, temporary dents (depressions) in the skin result from the local  injections, but these dents can fill in by themselves.  Topical minoxidil. Five percent topical minoxidil solution applied twice daily may grow hair in alopecia areata. Scalp, eyebrows, and beard hair may respond. If scalp hair re-grows completely, treatment can be stopped. Response may improve if topical cortisone cream is applied 30 minutes after the minoxidil. Topical minoxidil is safe, easy to use, and does not lower blood pressure in persons with normal blood pressure. Minoxidil can lead to unwanted facial hair growth in some people.  Anthralin cream or ointment. Another treatment is the application of anthralin cream or ointment. Anthralin is a tar-like substance that has been used widely for psoriasis. Anthralin is applied to the bare patches once daily. It is washed off after a short time, usually 30 to 60 minutes later. If new hair growth occurs, it is seen in 8 to 12 weeks. Anthralin can be irritating to the skin. It can cause temporary, brownish discoloration of the treated skin. By using short treatment times, skin irritation and skin staining are reduced without decreasing effectiveness. Care must be taken not to get anthralin in the eyes. Some of the medications used for more extensive cases where there is greater than 50% hair loss include:  Cortisone pills. Cortisone pills are sometimes given for extensive scalp hair loss. Cortisone taken internally is much stronger than local injections of cortisone into the skin. It is necessary to discuss possible side effects of cortisone pills with your caregiver. In general, however, cortisone pills are used in relatively few patients with alopecia areata due to health risks from prolonged use. Also, hair that has grown is likely to fall out when the cortisone pills are stopped.  Topical minoxidil. See previous explanation under mild, patchy alopecia areata. However, minoxidil is not effective in total loss of scalp hair (alopecia totalis).  Topical  immunotherapy. Another method of treating alopecia areata or alopecia totalis/universalis involves producing an allergic rash or allergic contact dermatitis. Chemicals such as diphencyprone (DPCP) or squaric acid dibutyl ester (SADBE) are applied to the scalp to produce an allergic rash which resembles poison oak or ivy. Approximately 40% of patients treated with topical immunotherapy will re-grow scalp hair after about 6 months of treatment. Those who do successfully re-grow scalp hair will need to continue treatment to maintain hair re-growth.  Wigs. For extensive hair loss, a wig can be an important option for some people. Proper attention will make a quality wig look completely natural. A wig will need to be cut, thinned, and styled. To keep a net base wig from falling off, special double-sided tape can be purchased in beauty supply outlets and fastened to the inside of the wig.  For those with completely bare heads, there are suction caps to which any wig can be attached. There are also entire suction cap wig units. FOR MORE INFORMATION National Alopecia Areata Foundation: https://www.berry.org/ Document Released: 12/12/2003 Document Revised: 08/01/2011 Document Reviewed: 07/15/2008 [facility] Patient Information 2013 [place].

## 2012-07-30 NOTE — Progress Notes (Signed)
Quick Note:  LMTCB ______ 

## 2012-08-01 ENCOUNTER — Other Ambulatory Visit: Payer: Self-pay | Admitting: *Deleted

## 2012-08-01 DIAGNOSIS — E039 Hypothyroidism, unspecified: Secondary | ICD-10-CM

## 2012-08-01 NOTE — Progress Notes (Signed)
Quick Note:  Pt informed ______ 

## 2012-08-20 ENCOUNTER — Other Ambulatory Visit: Payer: Self-pay | Admitting: Family Medicine

## 2012-09-07 ENCOUNTER — Other Ambulatory Visit: Payer: Self-pay | Admitting: Family Medicine

## 2012-09-12 ENCOUNTER — Other Ambulatory Visit: Payer: Self-pay | Admitting: Family Medicine

## 2012-09-16 ENCOUNTER — Other Ambulatory Visit: Payer: Self-pay | Admitting: Family Medicine

## 2012-10-14 ENCOUNTER — Other Ambulatory Visit: Payer: Self-pay | Admitting: Family Medicine

## 2012-11-06 ENCOUNTER — Other Ambulatory Visit: Payer: Self-pay | Admitting: Family Medicine

## 2012-11-13 ENCOUNTER — Telehealth: Payer: Self-pay | Admitting: *Deleted

## 2012-11-13 MED ORDER — TRAMADOL HCL 50 MG PO TABS: ORAL_TABLET | ORAL | Status: DC

## 2012-11-13 NOTE — Telephone Encounter (Signed)
Fax request for Tramadol Last Rx: 05.25.14, #[id number] Last OV: 03.06.14 Return: NS Please advise on refills/SLS

## 2012-11-13 NOTE — Telephone Encounter (Signed)
Rx request to pharmacy; pt informed of provider instructions & states that she will try not to use the maximum every day but "it is hard"/SLS

## 2012-11-13 NOTE — Telephone Encounter (Signed)
She can take up to 8/day but not beyond that amount.  She is apparently taking maximum dose.  OK to refill with 2 additional refills but try to scale back use some if possible.

## 2012-11-15 ENCOUNTER — Other Ambulatory Visit: Payer: Self-pay | Admitting: Family Medicine

## 2012-11-15 NOTE — Telephone Encounter (Signed)
Rx request to pharmacy/SLS  

## 2012-12-07 ENCOUNTER — Other Ambulatory Visit: Payer: Self-pay | Admitting: Family Medicine

## 2012-12-07 ENCOUNTER — Telehealth: Payer: Self-pay | Admitting: Family Medicine

## 2012-12-07 MED ORDER — CHLORTHALIDONE 25 MG PO TABS: ORAL_TABLET | ORAL | Status: DC

## 2012-12-07 NOTE — Telephone Encounter (Signed)
Rx sent to pharmacy   

## 2012-12-07 NOTE — Telephone Encounter (Signed)
PT is calling to request a refill of her chlorthalidone (HYGROTON) 25 MG tablet. She states that she understands that her Chlorthalidone is needing to be refilled a bit early, but she states that she had a bad spell with her Meniers disease, and had to double up for a few weeks. Please assist.

## 2012-12-24 ENCOUNTER — Other Ambulatory Visit: Payer: Self-pay | Admitting: Family Medicine

## 2013-01-17 ENCOUNTER — Other Ambulatory Visit: Payer: Self-pay | Admitting: Family Medicine

## 2013-01-18 ENCOUNTER — Other Ambulatory Visit: Payer: Self-pay | Admitting: Family Medicine

## 2013-01-28 ENCOUNTER — Telehealth: Payer: Self-pay | Admitting: Family Medicine

## 2013-01-28 NOTE — Telephone Encounter (Signed)
Pt wants to know what her cholesterol was in the past. Please assist.

## 2013-02-04 NOTE — Telephone Encounter (Signed)
Left message for patient to return call... Dont see that patient had her cholesterol checked here in the past

## 2013-02-12 ENCOUNTER — Telehealth: Payer: Self-pay

## 2013-02-12 ENCOUNTER — Other Ambulatory Visit: Payer: Self-pay | Admitting: Family Medicine

## 2013-02-12 DIAGNOSIS — E785 Hyperlipidemia, unspecified: Secondary | ICD-10-CM

## 2013-02-12 DIAGNOSIS — Z1231 Encounter for screening mammogram for malignant neoplasm of breast: Secondary | ICD-10-CM

## 2013-02-12 MED ORDER — CHLORTHALIDONE 25 MG PO TABS: ORAL_TABLET | ORAL | Status: DC

## 2013-02-12 NOTE — Telephone Encounter (Signed)
Order has been put in

## 2013-02-12 NOTE — Telephone Encounter (Signed)
OK to add lipids.  Use hyperlipidemia'

## 2013-02-12 NOTE — Telephone Encounter (Signed)
Pt is getting TSH done tomorrow and she wants to have her cholesterol checked also. Is it okay to add and what would the DX would you like me to use.

## 2013-02-13 ENCOUNTER — Other Ambulatory Visit (INDEPENDENT_AMBULATORY_CARE_PROVIDER_SITE_OTHER)

## 2013-02-13 DIAGNOSIS — E785 Hyperlipidemia, unspecified: Secondary | ICD-10-CM

## 2013-02-13 DIAGNOSIS — E039 Hypothyroidism, unspecified: Secondary | ICD-10-CM

## 2013-02-13 LAB — TSH: TSH: 0.28 u[IU]/mL — ABNORMAL LOW (ref 0.35–5.50)

## 2013-02-14 ENCOUNTER — Other Ambulatory Visit: Payer: Self-pay

## 2013-02-14 MED ORDER — LEVOTHYROXINE SODIUM 150 MCG PO TABS: 150.0000 ug | ORAL_TABLET | Freq: Every day | ORAL | Status: DC

## 2013-02-14 NOTE — Addendum Note (Signed)
Addended by: Noe Gens E on: 02/14/2013 10:05 AM   Modules accepted: Orders

## 2013-02-19 ENCOUNTER — Ambulatory Visit (HOSPITAL_COMMUNITY)
Admission: RE | Admit: 2013-02-19 | Discharge: 2013-02-19 | Disposition: A | Discharge status: Home / Self Care | Source: Ambulatory Visit | Attending: Family Medicine | Admitting: Family Medicine

## 2013-02-19 ENCOUNTER — Other Ambulatory Visit: Payer: Self-pay | Admitting: Family Medicine

## 2013-02-19 DIAGNOSIS — Z1231 Encounter for screening mammogram for malignant neoplasm of breast: Secondary | ICD-10-CM | POA: Insufficient documentation

## 2013-02-25 ENCOUNTER — Other Ambulatory Visit: Payer: Self-pay | Admitting: Family Medicine

## 2013-02-25 DIAGNOSIS — R928 Other abnormal and inconclusive findings on diagnostic imaging of breast: Secondary | ICD-10-CM

## 2013-03-08 ENCOUNTER — Ambulatory Visit
Admission: RE | Admit: 2013-03-08 | Discharge: 2013-03-08 | Disposition: A | Discharge status: Home / Self Care | Source: Ambulatory Visit | Attending: Family Medicine | Admitting: Family Medicine

## 2013-03-08 DIAGNOSIS — R928 Other abnormal and inconclusive findings on diagnostic imaging of breast: Secondary | ICD-10-CM

## 2013-03-18 ENCOUNTER — Other Ambulatory Visit: Payer: Self-pay | Admitting: Neurological Surgery

## 2013-03-18 DIAGNOSIS — M549 Dorsalgia, unspecified: Secondary | ICD-10-CM

## 2013-03-22 ENCOUNTER — Ambulatory Visit
Admission: RE | Admit: 2013-03-22 | Discharge: 2013-03-22 | Disposition: A | Discharge status: Home / Self Care | Source: Ambulatory Visit | Attending: Neurological Surgery | Admitting: Neurological Surgery

## 2013-03-22 VITALS — BP 123/72 | HR 61

## 2013-03-22 DIAGNOSIS — M549 Dorsalgia, unspecified: Secondary | ICD-10-CM

## 2013-03-22 MED ORDER — IOHEXOL 180 MG/ML  SOLN
18.0000 mL | Freq: Once | INTRAMUSCULAR | Status: AC | PRN
  Administered 2013-03-22: 18 mL via INTRATHECAL

## 2013-03-22 MED ORDER — ONDANSETRON HCL 4 MG/2ML IJ SOLN: 4.0000 mg | Freq: Four times a day (QID) | INTRAMUSCULAR | Status: DC | PRN

## 2013-03-22 MED ORDER — DIAZEPAM 5 MG PO TABS
10.0000 mg | ORAL_TABLET | Freq: Once | ORAL | Status: AC
  Administered 2013-03-22: 10 mg via ORAL

## 2013-03-22 MED ORDER — MEPERIDINE HCL 100 MG/ML IJ SOLN
100.0000 mg | Freq: Once | INTRAMUSCULAR | Status: AC
  Administered 2013-03-22: 100 mg via INTRAMUSCULAR

## 2013-03-22 MED ORDER — ONDANSETRON HCL 4 MG/2ML IJ SOLN
4.0000 mg | Freq: Once | INTRAMUSCULAR | Status: AC
  Administered 2013-03-22: 4 mg via INTRAMUSCULAR

## 2013-03-22 NOTE — Progress Notes (Signed)
Patient states she hasn't taken any Tramadol in over a week.

## 2013-03-27 ENCOUNTER — Other Ambulatory Visit: Payer: Self-pay | Admitting: Family Medicine

## 2013-03-27 ENCOUNTER — Other Ambulatory Visit: Payer: Self-pay | Admitting: Dermatology

## 2013-03-27 NOTE — Telephone Encounter (Signed)
Last refill 02/19/13 #240 0 refill Last visit 07/26/12

## 2013-03-28 NOTE — Telephone Encounter (Signed)
Refill for 3 months. 

## 2013-04-13 ENCOUNTER — Other Ambulatory Visit: Payer: Self-pay | Admitting: Gastroenterology

## 2013-04-15 NOTE — Telephone Encounter (Signed)
NEEDS ANY OFFICE VISIT FOR ANY FURTHER REFILLS!!

## 2013-05-11 ENCOUNTER — Other Ambulatory Visit: Payer: Self-pay | Admitting: Family Medicine

## 2013-05-13 ENCOUNTER — Other Ambulatory Visit: Payer: Self-pay | Admitting: Family Medicine

## 2013-05-13 ENCOUNTER — Other Ambulatory Visit: Payer: Self-pay | Admitting: Gastroenterology

## 2013-05-17 ENCOUNTER — Other Ambulatory Visit: Payer: Self-pay | Admitting: Internal Medicine

## 2013-05-20 ENCOUNTER — Telehealth: Payer: Self-pay | Admitting: Gastroenterology

## 2013-05-20 MED ORDER — OMEPRAZOLE 20 MG PO CPDR: DELAYED_RELEASE_CAPSULE | ORAL | Status: DC

## 2013-05-20 NOTE — Telephone Encounter (Signed)
Refill sent to the pharmacy and pt notified to keep appts for any further refills.

## 2013-06-10 ENCOUNTER — Encounter: Payer: Self-pay | Admitting: Gastroenterology

## 2013-06-10 ENCOUNTER — Other Ambulatory Visit (INDEPENDENT_AMBULATORY_CARE_PROVIDER_SITE_OTHER)

## 2013-06-10 ENCOUNTER — Ambulatory Visit (INDEPENDENT_AMBULATORY_CARE_PROVIDER_SITE_OTHER): Admitting: Gastroenterology

## 2013-06-10 VITALS — BP 122/80 | HR 64 | Ht 63.0 in | Wt 153.4 lb

## 2013-06-10 DIAGNOSIS — Z86010 Personal history of colonic polyps: Secondary | ICD-10-CM

## 2013-06-10 DIAGNOSIS — K900 Celiac disease: Secondary | ICD-10-CM

## 2013-06-10 DIAGNOSIS — E039 Hypothyroidism, unspecified: Secondary | ICD-10-CM

## 2013-06-10 DIAGNOSIS — K219 Gastro-esophageal reflux disease without esophagitis: Secondary | ICD-10-CM

## 2013-06-10 LAB — TSH: TSH: 2.86 u[IU]/mL (ref 0.35–5.50)

## 2013-06-10 MED ORDER — OMEPRAZOLE 20 MG PO CPDR: DELAYED_RELEASE_CAPSULE | ORAL | Status: DC

## 2013-06-10 NOTE — Progress Notes (Signed)
    History of Present Illness: This is a 58 year old female returning for followup of GERD celiac disease. She has no GI complaints. She underwent endoscopy with duodenal biopsies in 2012 biopsy showed mild epiglottitis but no evidence of celiac disease.  Current Medications, Allergies, Past Medical History, Past Surgical History, Family History and Social History were reviewed in [employer] record.  Physical Exam: General: Well developed , well nourished, no acute distress Head: Normocephalic and atraumatic Eyes:  sclerae anicteric, EOMI Ears: Normal auditory acuity Mouth: No deformity or lesions Lungs: Clear throughout to auscultation Heart: Regular rate and rhythm; no murmurs, rubs or bruits Abdomen: Soft, non tender and non distended. No masses, hepatosplenomegaly or hernias noted. Normal Bowel sounds Musculoskeletal: Symmetrical with no gross deformities  Pulses:  Normal pulses noted Extremities: No clubbing, cyanosis, edema or deformities noted Neurological: Alert oriented x 4, grossly nonfocal Psychological:  Alert and cooperative. Normal mood and affect  Assessment and Recommendations:  1. Celiac disease. Well controlled. Maintain gluten-free diet. Repeat celiac antibodies. Consider followup endoscopy at the time of her next colonoscopy.   2. GERD. Continue omeprazole 20 mg bid and standard antireflux measures.   3. Personal history of tubulovillous adenomatous colon polyp. Surveillance colonoscopy recommended September 2015.

## 2013-06-10 NOTE — Patient Instructions (Addendum)
You have been given a separate informational sheet regarding your tobacco use, the importance of quitting and local resources to help you quit.  Your physician has requested that you go to the basement for the following lab work before leaving today: Celiac panel.  We have sent the following medications to your pharmacy for you to pick up at your convenience:Omeprazole.  You will be due for a recall colonoscopy in 01/2014. We will send you a reminder in the mail when it gets closer to that time.  Thank you for choosing me and [facility].  Pricilla Riffle. Dagoberto Ligas., MD., Marval Regal

## 2013-06-11 ENCOUNTER — Encounter: Payer: Self-pay | Admitting: Family Medicine

## 2013-06-11 ENCOUNTER — Ambulatory Visit (INDEPENDENT_AMBULATORY_CARE_PROVIDER_SITE_OTHER): Admitting: Family Medicine

## 2013-06-11 VITALS — BP 124/76 | HR 67 | Wt 153.0 lb

## 2013-06-11 DIAGNOSIS — R209 Unspecified disturbances of skin sensation: Secondary | ICD-10-CM

## 2013-06-11 DIAGNOSIS — E039 Hypothyroidism, unspecified: Secondary | ICD-10-CM

## 2013-06-11 DIAGNOSIS — R071 Chest pain on breathing: Secondary | ICD-10-CM

## 2013-06-11 DIAGNOSIS — Z0000 Encounter for general adult medical examination without abnormal findings: Secondary | ICD-10-CM

## 2013-06-11 DIAGNOSIS — R350 Frequency of micturition: Secondary | ICD-10-CM

## 2013-06-11 DIAGNOSIS — R0789 Other chest pain: Secondary | ICD-10-CM

## 2013-06-11 DIAGNOSIS — R202 Paresthesia of skin: Secondary | ICD-10-CM

## 2013-06-11 LAB — CELIAC PANEL 10
Endomysial Screen: NEGATIVE
Gliadin IgA: 8.3 U/mL (ref <20)
Gliadin IgG: 9 U/mL (ref <20)
IgA: 154 mg/dL (ref 69–380)
Tissue Transglut Ab: 12.4 U/mL (ref <20)
Tissue Transglutaminase Ab, IgA: 10.8 U/mL (ref <20)

## 2013-06-11 LAB — POCT URINALYSIS DIPSTICK
Bilirubin, UA: NEGATIVE
Blood, UA: NEGATIVE
Glucose, UA: NEGATIVE
Ketones, UA: NEGATIVE
Leukocytes, UA: NEGATIVE
Nitrite, UA: NEGATIVE
Spec Grav, UA: 1.02
Urobilinogen, UA: 0.2
pH, UA: 7

## 2013-06-11 NOTE — Progress Notes (Signed)
Pre visit review using our clinic review tool, if applicable. No additional management support is needed unless otherwise documented below in the visit note. 

## 2013-06-11 NOTE — Progress Notes (Signed)
Subjective:    Patient ID: Kimberly Stein, female    DOB: 06-24-1955, 58 y.o.   MRN: [id number]  HPI The patient's chronic problems including history of celiac disease, GERD, hypothyroidism, osteopenia, fibromyalgia, hypertension. She is seen today with complaints of urine frequency for the past several weeks. No appetite or weight changes. No dry mouth. No burning with urination. She has some urinary urgency but no urge incontinence.  She also complains of bilateral hand tingling sensation. No clear exacerbating factors. Symptoms are somewhat off and on. No weakness. Denies any cervical neck pain. Recent TSH normal. No history of B12 deficiency. She does take chronic PPI and no recent B12 level. Denies any lower extremity symptoms  Patient relates some right rib cage pains intermittently. No cough. No dyspnea. No pleuritic pain. Pain is poorly localized right lower rib cage area. Not sore to touch. No rashes. No nausea or vomiting. No alleviating or exacerbating factors. Symptoms are relatively mild.  She's had previous cholecystectomy  Past Medical History  Diagnosis Date  . Allergy     sinus infections  . Anemia   . Arthritis   . Cancer     skin  . GERD (gastroesophageal reflux disease)   . Heart murmur   . Meniere's disease   . Neuromuscular disorder     fibromyalgia  . Thyroid disease     hypothyroid  . 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 Total abdominal hysterectomy    . Cholecystectomy    . Colonoscopy    . Upper gastrointestinal endoscopy    . Dilation and curettage of uterus    . Cervical disc surgery      reports that she has been smoking Cigarettes.  She has been smoking about 1.00 pack per day. She has never used smokeless tobacco. She reports that she does not drink alcohol or use illicit drugs. family history includes Celiac disease in her mother; Diabetes in her mother; Heart disease in her mother;  Prostate cancer in her father. There is no history of Colon cancer, Esophageal cancer, or Stomach cancer. Allergies  Allergen Reactions  . Vicodin [Hydrocodone-Acetaminophen] Itching      Review of Systems  Constitutional: Negative for fever, chills, appetite change and unexpected weight change.  Respiratory: Negative for cough and shortness of breath.   Cardiovascular: Negative for chest pain.  Gastrointestinal: Negative for nausea, vomiting, abdominal pain, diarrhea and constipation.  Endocrine: Negative for polydipsia.  Genitourinary: Positive for frequency. Negative for dysuria.  Skin: Negative for rash.  Neurological: Negative for dizziness.       Objective:   Physical Exam  Constitutional: She appears well-developed and well-nourished.  Cardiovascular: Normal rate and regular rhythm.   Pulmonary/Chest: Effort normal and breath sounds normal. No respiratory distress. She has no wheezes. She has no rales.  Abdominal: Soft. Bowel sounds are normal. She exhibits no distension and no mass. There is no tenderness. There is no rebound and no guarding.  Musculoskeletal: She exhibits no edema.  Neurological: She has normal reflexes. No cranial nerve deficit. Coordination normal.  No focal strength deficits. Reflexes are 2+ throughout upper extremities Normal sensory to touch  Skin: No rash noted.          Assessment & Plan:  #1 Paresthesias involving the hands bilaterally. She states this is different from her carpal tunnel syndrome. She has involvement of all digits of the hand. Recent TSH normal. Rule out metabolic etiology such  as B12 deficiency (chronic PPI use). Check B12 level with upcoming labs #2 history of hypothyroidism. Recent TSH at goal #3 urine frequency. Suspect some urinary urgency. Urine dipstick unremarkable. We discussed possible medications at this point she wishes to wait. Reduce caffeine intake #4 right rib cage pain. Mild. Nonfocal exam. Consider chest  x-ray at followup and further evaluation if persists #5 health maintenance. Schedule complete physical

## 2013-06-23 ENCOUNTER — Telehealth: Payer: Self-pay | Admitting: Family Medicine

## 2013-06-23 NOTE — Telephone Encounter (Signed)
Relevant patient education mailed to patient.  

## 2013-06-26 ENCOUNTER — Ambulatory Visit (INDEPENDENT_AMBULATORY_CARE_PROVIDER_SITE_OTHER): Admitting: Family Medicine

## 2013-06-26 ENCOUNTER — Encounter: Payer: Self-pay | Admitting: Family Medicine

## 2013-06-26 VITALS — BP 128/72 | HR 95 | Temp 97.6°F | Ht 63.0 in | Wt 150.0 lb

## 2013-06-26 DIAGNOSIS — Z0000 Encounter for general adult medical examination without abnormal findings: Secondary | ICD-10-CM

## 2013-06-26 DIAGNOSIS — E039 Hypothyroidism, unspecified: Secondary | ICD-10-CM

## 2013-06-26 DIAGNOSIS — Z23 Encounter for immunization: Secondary | ICD-10-CM

## 2013-06-26 LAB — CBC WITH DIFFERENTIAL/PLATELET
Basophils Absolute: 0 10*3/uL (ref 0.0–0.1)
Basophils Relative: 0.3 % (ref 0.0–3.0)
Eosinophils Absolute: 0.1 10*3/uL (ref 0.0–0.7)
Eosinophils Relative: 0.6 % (ref 0.0–5.0)
HCT: 41.8 % (ref 36.0–46.0)
Hemoglobin: 14.1 g/dL (ref 12.0–15.0)
Lymphocytes Relative: 34.9 % (ref 12.0–46.0)
Lymphs Abs: 4.6 10*3/uL — ABNORMAL HIGH (ref 0.7–4.0)
MCHC: 33.8 g/dL (ref 30.0–36.0)
MCV: 99 fl (ref 78.0–100.0)
Monocytes Absolute: 0.4 10*3/uL (ref 0.1–1.0)
Monocytes Relative: 3.2 % (ref 3.0–12.0)
Neutro Abs: 8.1 10*3/uL — ABNORMAL HIGH (ref 1.4–7.7)
Neutrophils Relative %: 61 % (ref 43.0–77.0)
Platelets: 221 10*3/uL (ref 150.0–400.0)
RBC: 4.22 Mil/uL (ref 3.87–5.11)
RDW: 14.4 % (ref 11.5–14.6)
WBC: 13.3 10*3/uL — ABNORMAL HIGH (ref 4.5–10.5)

## 2013-06-26 LAB — POCT URINALYSIS DIPSTICK
Bilirubin, UA: NEGATIVE
Blood, UA: NEGATIVE
Glucose, UA: NEGATIVE
Ketones, UA: NEGATIVE
Leukocytes, UA: NEGATIVE
Nitrite, UA: NEGATIVE
Protein, UA: NEGATIVE
Spec Grav, UA: 1.01
Urobilinogen, UA: 0.2
pH, UA: 7.5

## 2013-06-26 NOTE — Progress Notes (Signed)
Pre visit review using our clinic review tool, if applicable. No additional management support is needed unless otherwise documented below in the visit note. 

## 2013-06-26 NOTE — Patient Instructions (Signed)
We are currently out of Prevnar 13 (pneumonia vaccine) and would recommend return sometime later this year for that.

## 2013-06-26 NOTE — Progress Notes (Signed)
Subjective:    Patient ID: Kimberly Stein, female    DOB: [birth year], 58 y.o.   MRN: [id number]  HPI  Seen for complete physical. She's had previous hysterectomy. She had recent mammogram. Last tetanus estimated over 10 years ago. She is a smoker has never had pneumonia vaccine. She is getting regular colonoscopies. She has history of celiac disease and is diligent regarding gluten-free diet. She has hypertension treated with losartan and chlorthalidone. Blood pressures been stable. She has history of GERD which is been stable on omeprazole. Hypothyroidism treated with levothyroxine.  She still smokes one pack cigarettes per day and has low motivation to quit [facility] some for exercise.  Past Medical History  Diagnosis Date  . Allergy     sinus infections  . Anemia   . Arthritis   . Cancer     skin  . GERD (gastroesophageal reflux disease)   . Heart murmur   . Meniere's disease   . Neuromuscular disorder     fibromyalgia  . Thyroid disease     hypothyroid  . 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 Total abdominal hysterectomy    . Cholecystectomy    . Colonoscopy    . Upper gastrointestinal endoscopy    . Dilation and curettage of uterus    . Cervical disc surgery      reports that she has been smoking Cigarettes.  She has been smoking about 1.00 pack per day. She has never used smokeless tobacco. She reports that she does not drink alcohol or use illicit drugs. family history includes Celiac disease in her mother; Diabetes in her mother; Heart disease in her mother; Prostate cancer in her father. There is no history of Colon cancer, Esophageal cancer, or Stomach cancer. Allergies  Allergen Reactions  . Vicodin [Hydrocodone-Acetaminophen] Itching      Review of Systems  Constitutional: Negative for fever, activity change, appetite change, fatigue and unexpected weight change.  HENT: Negative for ear pain, hearing  loss, sore throat and trouble swallowing.   Eyes: Negative for visual disturbance.  Respiratory: Negative for cough and shortness of breath.   Cardiovascular: Negative for chest pain and palpitations.  Gastrointestinal: Negative for abdominal pain, diarrhea, constipation and blood in stool.  Endocrine: Negative for polydipsia and polyuria.  Genitourinary: Negative for dysuria and hematuria.  Musculoskeletal: Negative for arthralgias, back pain and myalgias.  Skin: Negative for rash.  Neurological: Negative for dizziness, syncope and headaches.  Hematological: Negative for adenopathy.  Psychiatric/Behavioral: Negative for confusion and dysphoric mood.       Objective:   Physical Exam  Constitutional: She is oriented to person, place, and time. She appears well-developed and well-nourished.  HENT:  Head: Normocephalic and atraumatic.  Eyes: EOM are normal. Pupils are equal, round, and reactive to light.  Neck: Normal range of motion. Neck supple. No thyromegaly present.  Cardiovascular: Normal rate, regular rhythm and normal heart sounds.   No murmur heard. Pulmonary/Chest: Breath sounds normal. No respiratory distress. She has no wheezes. She has no rales.  Abdominal: Soft. Bowel sounds are normal. She exhibits no distension and no mass. There is no tenderness. There is no rebound and no guarding.  Genitourinary:  Breasts symmetric with no mass  Musculoskeletal: Normal range of motion. She exhibits no edema.  Lymphadenopathy:    She has no cervical adenopathy.  Neurological: She is alert and oriented to person, place, and time. She displays normal reflexes. No cranial  nerve deficit.  Skin: No rash noted.  Psychiatric: She has a normal mood and affect. Her behavior is normal. Judgment and thought content normal.          Assessment & Plan:  Complete physical. Patient encouraged to stop smoking but current motivation is low. Obtain screening lab work. Tetanus booster given.  Currently out of Prevnar 13 but she needs pneumonia vaccine and return later for this

## 2013-06-27 LAB — LIPID PANEL
Cholesterol: 353 mg/dL — ABNORMAL HIGH (ref 0–200)
HDL: 74.7 mg/dL (ref >39.00)
Total CHOL/HDL Ratio: 5
Triglycerides: 144 mg/dL (ref 0.0–149.0)
VLDL: 28.8 mg/dL (ref 0.0–40.0)

## 2013-06-27 LAB — HEPATIC FUNCTION PANEL
ALT: 24 U/L (ref 0–35)
AST: 18 U/L (ref 0–37)
Albumin: 4.3 g/dL (ref 3.5–5.2)
Alkaline Phosphatase: 55 U/L (ref 39–117)
Bilirubin, Direct: 0 mg/dL (ref 0.0–0.3)
Total Bilirubin: 0.7 mg/dL (ref 0.3–1.2)
Total Protein: 7.8 g/dL (ref 6.0–8.3)

## 2013-06-27 LAB — BASIC METABOLIC PANEL
BUN: 15 mg/dL (ref 6–23)
CO2: 29 mEq/L (ref 19–32)
Calcium: 9.6 mg/dL (ref 8.4–10.5)
Chloride: 100 mEq/L (ref 96–112)
Creatinine, Ser: 0.6 mg/dL (ref 0.4–1.2)
GFR: 120.86 mL/min (ref >60.00)
Glucose, Bld: 88 mg/dL (ref 70–99)
Potassium: 3.2 mEq/L — ABNORMAL LOW (ref 3.5–5.1)
Sodium: 139 mEq/L (ref 135–145)

## 2013-06-27 LAB — LDL CHOLESTEROL, DIRECT: Direct LDL: 267.7 mg/dL

## 2013-06-27 LAB — TSH: TSH: 2.19 u[IU]/mL (ref 0.35–5.50)

## 2013-06-27 LAB — VITAMIN D 25 HYDROXY (VIT D DEFICIENCY, FRACTURES): Vit D, 25-Hydroxy: 56 ng/mL (ref 30–89)

## 2013-06-28 ENCOUNTER — Telehealth: Payer: Self-pay | Admitting: Family Medicine

## 2013-06-28 NOTE — Telephone Encounter (Signed)
Relevant patient education mailed to patient.  

## 2013-07-01 ENCOUNTER — Other Ambulatory Visit: Payer: Self-pay

## 2013-07-01 ENCOUNTER — Encounter: Admitting: Family Medicine

## 2013-07-01 MED ORDER — ATORVASTATIN CALCIUM 40 MG PO TABS: 40.0000 mg | ORAL_TABLET | Freq: Every day | ORAL | Status: DC

## 2013-07-15 ENCOUNTER — Telehealth: Payer: Self-pay | Admitting: Family Medicine

## 2013-07-15 NOTE — Telephone Encounter (Signed)
Hold Lipitor and let me know if pain resolves within 2-3 weeks

## 2013-07-15 NOTE — Telephone Encounter (Signed)
Pt informed and verbally understand.

## 2013-07-15 NOTE — Telephone Encounter (Signed)
Patient Information:  Caller Name: [name]  Phone: [phone]  Patient: Kimberly Stein, Kimberly Stein  Gender: Female  DOB: 1956-04-24  Age: 58 Years  PCP: [name] [facility])  Office Follow Up:  Does the office need to follow up with this patient?: Yes  Instructions For The Office: Pt is requesting that PCP review and advise.  Pt declined offered appt and request that PCP review and advise if he believes that LIPITOR could be the cause of the pain.  Please follow up with pt  RN Note:  Pt is requesting that PCP review and advise.  Pt declined offered appt and request that PCP review and advise if he believes that LIPITOR could be the cause of the pain.  Please follow up with pt  Symptoms  Reason For Call & Symptoms: Pt is complaining of muscle pain in legs.  She states that it started in the middle of the night and woke her up.  She also reports that she was recently started on Lipitor and feels like that may be the cause of the pain.  Chronic lower back pain is also present.Pain is present in both legs.  ALso mild pain in arms  Pt has procedure on back 2/26 to "burn the nerves"  Reviewed Health History In EMR: Yes  Reviewed Medications In EMR: Yes  Reviewed Allergies In EMR: Yes  Reviewed Surgeries / Procedures: Yes  Date of Onset of Symptoms: 07/14/2013  Guideline(s) Used:  Leg Pain  Disposition Per Guideline:   See Within 2 Weeks in Office  Reason For Disposition Reached:   Mild pain persists > 7 days  Advice Given:  Call Back If:  You become worse.  RN Overrode Recommendation:  Follow Up With Office Later  Pt is requesting that PCP review and advise.  Pt declined offered appt and request that PCP review and advise if he believes that LIPITOR could be the cause of the pain.  Please follow up with pt

## 2013-07-16 ENCOUNTER — Telehealth: Payer: Self-pay | Admitting: Family Medicine

## 2013-07-16 NOTE — Telephone Encounter (Signed)
Scheduling calling pt to schedule appointment tomorrow with Dr. Elease Hashimoto no available appointments today.

## 2013-07-16 NOTE — Telephone Encounter (Signed)
Would offer her office follow up if we have slots, based on her symptoms.

## 2013-07-16 NOTE — Telephone Encounter (Signed)
Patient Information:  Caller Name: Alyia  Phone: [phone]  Patient: Kimberly Stein, Kimberly Stein  Gender: Female  DOB: [birth year]  Age: 58 Years  PCP: [name] [facility])  Office Follow Up:  Does the office need to follow up with this patient?: Yes  Instructions For The Office: Please call for an apppointment or UC recommendation.  RN Note:  Pt reports a feeling of her heart beating through her back early this AM. No pain just an odd feeling. Lasted 15 minutes while laying down.   Symptoms  Reason For Call & Symptoms: Chest pain  Reviewed Health History In EMR: Yes  Reviewed Medications In EMR: Yes  Reviewed Allergies In EMR: Yes  Reviewed Surgeries / Procedures: Yes  Date of Onset of Symptoms: 07/16/2013  Guideline(s) Used:  Chest Pain  Disposition Per Guideline:   Go to ED Now (or to Office with PCP Approval)  Reason For Disposition Reached:   Chest pain lasting longer than 5 minutes  Advice Given:  Fleeting Chest Pain:  Fleeting chest pains that last only a few seconds and then go away are generally not serious. They may be from pinched muscles or nerves in your chest wall.  Call Back If:  Difficulty breathing  Call Back If:  Severe chest pain  Constant chest pain lasting longer than 5 minutes  Difficulty breathing  Patient Will Follow Care Advice:  YES

## 2013-07-17 ENCOUNTER — Ambulatory Visit (INDEPENDENT_AMBULATORY_CARE_PROVIDER_SITE_OTHER): Admitting: Family Medicine

## 2013-07-17 ENCOUNTER — Encounter: Payer: Self-pay | Admitting: Family Medicine

## 2013-07-17 VITALS — BP 120/78 | HR 78 | Wt 154.0 lb

## 2013-07-17 DIAGNOSIS — E876 Hypokalemia: Secondary | ICD-10-CM

## 2013-07-17 DIAGNOSIS — Z2911 Encounter for prophylactic immunotherapy for respiratory syncytial virus (RSV): Secondary | ICD-10-CM

## 2013-07-17 DIAGNOSIS — Z23 Encounter for immunization: Secondary | ICD-10-CM

## 2013-07-17 DIAGNOSIS — E785 Hyperlipidemia, unspecified: Secondary | ICD-10-CM

## 2013-07-17 DIAGNOSIS — R002 Palpitations: Secondary | ICD-10-CM

## 2013-07-17 LAB — BASIC METABOLIC PANEL
BUN: 16 mg/dL (ref 6–23)
CO2: 31 mEq/L (ref 19–32)
Calcium: 10.1 mg/dL (ref 8.4–10.5)
Chloride: 100 mEq/L (ref 96–112)
Creatinine, Ser: 0.6 mg/dL (ref 0.4–1.2)
GFR: 107.23 mL/min (ref >60.00)
Glucose, Bld: 80 mg/dL (ref 70–99)
Potassium: 3.4 mEq/L — ABNORMAL LOW (ref 3.5–5.1)
Sodium: 138 mEq/L (ref 135–145)

## 2013-07-17 NOTE — Progress Notes (Signed)
Pre visit review using our clinic review tool, if applicable. No additional management support is needed unless otherwise documented below in the visit note. 

## 2013-07-17 NOTE — Patient Instructions (Signed)
Start Crestor 5 mg one daily

## 2013-07-17 NOTE — Progress Notes (Signed)
   Subjective:    Patient ID: Kimberly Stein, female    DOB: 07/25/1955, 58 y.o.   MRN: [id number]  Hyperlipidemia Associated symptoms include myalgias. Pertinent negatives include no chest pain or shortness of breath.   Patient called couple days ago with fleeting palpitations. No chest pain. These occurred at rest. None today. She denied any associated dizziness. No dyspnea. She checked her pulse and this was regular and normal.  Severe hyperlipidemia with cholesterol of 353 and LDL cholesterol 267. We have started Lipitor but she had severe myalgias especially legs. She stopped the Lipitor and her myalgias are slowly improving. She would like to explore other options. She has not tried other statins previously.  Patient had mild hypokalemia with recent potassium 3.2. She does take chlorthalidone one daily. She's also taking potassium supplement and doubled is up to 2 daily after recent lab.  Past Medical History  Diagnosis Date  . Allergy     sinus infections  . Anemia   . Arthritis   . Cancer     skin  . GERD (gastroesophageal reflux disease)   . Heart murmur   . Meniere's disease   . Neuromuscular disorder     fibromyalgia  . Thyroid disease     hypothyroid  . 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 Total abdominal hysterectomy    . Cholecystectomy    . Colonoscopy    . Upper gastrointestinal endoscopy    . Dilation and curettage of uterus    . Cervical disc surgery      reports that she has been smoking Cigarettes.  She has been smoking about 1.00 pack per day. She has never used smokeless tobacco. She reports that she does not drink alcohol or use illicit drugs. family history includes Celiac disease in her mother; Diabetes in her mother; Heart disease in her mother; Prostate cancer in her father. There is no history of Colon cancer, Esophageal cancer, or Stomach cancer. Allergies  Allergen Reactions  . Vicodin  [Hydrocodone-Acetaminophen] Itching      Review of Systems  Constitutional: Negative for fatigue.  Eyes: Negative for visual disturbance.  Respiratory: Negative for cough, chest tightness, shortness of breath and wheezing.   Cardiovascular: Positive for palpitations. Negative for chest pain and leg swelling.  Musculoskeletal: Positive for myalgias.  Neurological: Negative for dizziness, seizures, syncope, weakness, light-headedness and headaches.       Objective:   Physical Exam  Constitutional: She appears well-developed and well-nourished.  HENT:  Mouth/Throat: Oropharynx is clear and moist.  Neck: Neck supple. No thyromegaly present.  Cardiovascular: Normal rate and regular rhythm.  Exam reveals no gallop.   Pulmonary/Chest: Effort normal and breath sounds normal. No respiratory distress. She has no wheezes. She has no rales.  Musculoskeletal: She exhibits no edema.          Assessment & Plan:  #1 severe hyperlipidemia. After current myalgias have resolved start Crestor 5 mg once daily samples provided. Recheck lipids in 6-8 weeks and titrate Crestor if tolerated #2 recent mild hypokalemia. Recheck basic metabolic panel.  Increase potassium rich foods #3 recent transient palpitations. Normal exam today. Reassurance. Avoid excessive caffeine. Recent TSH normal #4 health maintenance. Patient requesting shingles vaccine and Pneumovax. She does have history of smoking. We discussed risk and benefits of shingles vaccine and she consents.

## 2013-07-19 ENCOUNTER — Telehealth: Payer: Self-pay | Admitting: Family Medicine

## 2013-07-19 NOTE — Telephone Encounter (Signed)
Relevant patient education mailed to patient.  

## 2013-07-24 ENCOUNTER — Other Ambulatory Visit: Payer: Self-pay | Admitting: Family Medicine

## 2013-07-24 NOTE — Telephone Encounter (Signed)
Refill for 3 months. 

## 2013-07-24 NOTE — Telephone Encounter (Signed)
Last visit 07/17/13 Last refill 03/27/13 #240 2 refills

## 2013-08-05 ENCOUNTER — Other Ambulatory Visit: Payer: Self-pay | Admitting: Family Medicine

## 2013-08-12 ENCOUNTER — Other Ambulatory Visit: Payer: Self-pay | Admitting: Family Medicine

## 2013-08-14 ENCOUNTER — Other Ambulatory Visit: Payer: Self-pay | Admitting: Family Medicine

## 2013-08-26 ENCOUNTER — Other Ambulatory Visit: Payer: Self-pay | Admitting: Family Medicine

## 2013-09-04 ENCOUNTER — Other Ambulatory Visit: Payer: Self-pay | Admitting: Family Medicine

## 2013-09-10 ENCOUNTER — Telehealth: Payer: Self-pay | Admitting: Family Medicine

## 2013-09-10 NOTE — Telephone Encounter (Signed)
Pt needs more samples of crestor 49m. Pt has lab appt sch for this friday

## 2013-09-10 NOTE — Telephone Encounter (Signed)
Pt informed that samples are ready for pick up.

## 2013-09-13 ENCOUNTER — Other Ambulatory Visit (INDEPENDENT_AMBULATORY_CARE_PROVIDER_SITE_OTHER)

## 2013-09-13 DIAGNOSIS — R209 Unspecified disturbances of skin sensation: Secondary | ICD-10-CM

## 2013-09-13 DIAGNOSIS — R202 Paresthesia of skin: Secondary | ICD-10-CM

## 2013-09-13 DIAGNOSIS — Z0000 Encounter for general adult medical examination without abnormal findings: Secondary | ICD-10-CM

## 2013-09-13 DIAGNOSIS — E785 Hyperlipidemia, unspecified: Secondary | ICD-10-CM

## 2013-09-13 LAB — BASIC METABOLIC PANEL
BUN: 17 mg/dL (ref 6–23)
CO2: 28 mEq/L (ref 19–32)
Calcium: 9.9 mg/dL (ref 8.4–10.5)
Chloride: 100 mEq/L (ref 96–112)
Creatinine, Ser: 0.5 mg/dL (ref 0.4–1.2)
GFR: 126.04 mL/min (ref >60.00)
Glucose, Bld: 96 mg/dL (ref 70–99)
Potassium: 3.7 mEq/L (ref 3.5–5.1)
Sodium: 137 mEq/L (ref 135–145)

## 2013-09-13 LAB — HEPATIC FUNCTION PANEL
ALT: 18 U/L (ref 0–35)
AST: 19 U/L (ref 0–37)
Albumin: 4 g/dL (ref 3.5–5.2)
Alkaline Phosphatase: 56 U/L (ref 39–117)
Bilirubin, Direct: 0 mg/dL (ref 0.0–0.3)
Total Bilirubin: 0.3 mg/dL (ref 0.3–1.2)
Total Protein: 7.2 g/dL (ref 6.0–8.3)

## 2013-09-13 LAB — CBC WITH DIFFERENTIAL/PLATELET
Basophils Absolute: 0 10*3/uL (ref 0.0–0.1)
Basophils Relative: 0.5 % (ref 0.0–3.0)
Eosinophils Absolute: 0.1 10*3/uL (ref 0.0–0.7)
Eosinophils Relative: 1.2 % (ref 0.0–5.0)
HCT: 39.2 % (ref 36.0–46.0)
Hemoglobin: 13 g/dL (ref 12.0–15.0)
Lymphocytes Relative: 39.8 % (ref 12.0–46.0)
Lymphs Abs: 3.4 10*3/uL (ref 0.7–4.0)
MCHC: 33.3 g/dL (ref 30.0–36.0)
MCV: 98 fl (ref 78.0–100.0)
Monocytes Absolute: 0.5 10*3/uL (ref 0.1–1.0)
Monocytes Relative: 5.3 % (ref 3.0–12.0)
Neutro Abs: 4.6 10*3/uL (ref 1.4–7.7)
Neutrophils Relative %: 53.2 % (ref 43.0–77.0)
Platelets: 190 10*3/uL (ref 150.0–400.0)
RBC: 3.99 Mil/uL (ref 3.87–5.11)
RDW: 13.6 % (ref 11.5–14.6)
WBC: 8.6 10*3/uL (ref 4.5–10.5)

## 2013-09-13 LAB — LIPID PANEL
Cholesterol: 182 mg/dL (ref 0–200)
HDL: 61.7 mg/dL (ref >39.00)
LDL Cholesterol: 89 mg/dL (ref 0–99)
Total CHOL/HDL Ratio: 3
Triglycerides: 157 mg/dL — ABNORMAL HIGH (ref 0.0–149.0)
VLDL: 31.4 mg/dL (ref 0.0–40.0)

## 2013-09-16 LAB — VITAMIN B12: Vitamin B-12: 549 pg/mL (ref 211–911)

## 2013-10-23 ENCOUNTER — Other Ambulatory Visit: Payer: Self-pay | Admitting: Family Medicine

## 2013-10-23 NOTE — Telephone Encounter (Signed)
Refill for 3 months. 

## 2013-10-23 NOTE — Telephone Encounter (Signed)
Last visit 07/17/13 Last refill 07/24/13 #240 2 refill

## 2013-10-24 ENCOUNTER — Other Ambulatory Visit: Payer: Self-pay | Admitting: Dermatology

## 2013-11-10 ENCOUNTER — Other Ambulatory Visit: Payer: Self-pay | Admitting: Family Medicine

## 2013-11-11 ENCOUNTER — Other Ambulatory Visit: Payer: Self-pay | Admitting: Family Medicine

## 2013-12-06 ENCOUNTER — Encounter: Payer: Self-pay | Admitting: Gastroenterology

## 2014-01-16 ENCOUNTER — Other Ambulatory Visit: Payer: Self-pay | Admitting: Family Medicine

## 2014-01-16 NOTE — Telephone Encounter (Signed)
Last visit 07/17/13 Last refill 10/23/13 #240 2 refill

## 2014-01-16 NOTE — Telephone Encounter (Signed)
Refill OK

## 2014-01-19 ENCOUNTER — Other Ambulatory Visit: Payer: Self-pay | Admitting: Family Medicine

## 2014-02-16 ENCOUNTER — Other Ambulatory Visit: Payer: Self-pay | Admitting: Family Medicine

## 2014-02-17 NOTE — Telephone Encounter (Signed)
Last visit 07/17/13 Last refill 01/17/14 #240 0 refill

## 2014-02-17 NOTE — Telephone Encounter (Signed)
Refill OK .  Try to scale back gradually, if tolerated.

## 2014-02-18 NOTE — Telephone Encounter (Signed)
Pt following up on request. Pt is going out of town and waiting on this refill. Thank you [facility]/ [place]

## 2014-03-02 ENCOUNTER — Other Ambulatory Visit: Payer: Self-pay | Admitting: Family Medicine

## 2014-03-03 ENCOUNTER — Other Ambulatory Visit: Payer: Self-pay | Admitting: Family Medicine

## 2014-03-03 DIAGNOSIS — Z1231 Encounter for screening mammogram for malignant neoplasm of breast: Secondary | ICD-10-CM

## 2014-03-10 ENCOUNTER — Ambulatory Visit (HOSPITAL_COMMUNITY)

## 2014-03-19 ENCOUNTER — Ambulatory Visit (HOSPITAL_COMMUNITY)

## 2014-03-20 ENCOUNTER — Other Ambulatory Visit: Payer: Self-pay | Admitting: Family Medicine

## 2014-03-20 NOTE — Telephone Encounter (Signed)
Last visit 07/17/13  Last refill 02/18/14 #240 0 refill

## 2014-03-20 NOTE — Telephone Encounter (Signed)
Refill OK.  I would like for her to try to scale back somewhat if possible.  She is currently taking maximum of 8 tablets per day.

## 2014-03-26 ENCOUNTER — Ambulatory Visit (HOSPITAL_COMMUNITY)
Admission: RE | Admit: 2014-03-26 | Discharge: 2014-03-26 | Disposition: A | Discharge status: Home / Self Care | Source: Ambulatory Visit | Attending: Family Medicine | Admitting: Family Medicine

## 2014-03-26 DIAGNOSIS — Z1231 Encounter for screening mammogram for malignant neoplasm of breast: Secondary | ICD-10-CM | POA: Insufficient documentation

## 2014-04-17 ENCOUNTER — Other Ambulatory Visit: Payer: Self-pay | Admitting: Family Medicine

## 2014-04-21 NOTE — Telephone Encounter (Signed)
Refill OK.  Would try scaling back from 8 tablets daily, if possible.

## 2014-04-21 NOTE — Telephone Encounter (Signed)
Last visit 07/17/13 Last refill 03/20/14 #240 0 refill

## 2014-05-12 ENCOUNTER — Other Ambulatory Visit: Payer: Self-pay | Admitting: Family Medicine

## 2014-05-19 ENCOUNTER — Other Ambulatory Visit: Payer: Self-pay | Admitting: Family Medicine

## 2014-05-19 NOTE — Telephone Encounter (Signed)
Refill once.  Needs office follow up soon.

## 2014-05-19 NOTE — Telephone Encounter (Signed)
Last visit 07/17/13 Last refill 04/22/14 #240 0 refill

## 2014-05-23 HISTORY — PX: MOUTH SURGERY: SHX715

## 2014-06-02 ENCOUNTER — Telehealth: Payer: Self-pay | Admitting: Family Medicine

## 2014-06-02 NOTE — Telephone Encounter (Signed)
Pt would like you to know the pain management dr has put pt on Oxycodone, #40 /mo Pt is not able to sleep on either hip or back. Pt had 2nd nerve ablation done.

## 2014-06-05 ENCOUNTER — Other Ambulatory Visit: Payer: Self-pay | Admitting: Gastroenterology

## 2014-06-05 NOTE — Telephone Encounter (Signed)
NEEDS OFFICE VISIT FOR ANY FURTHER REFILLS! 

## 2014-06-18 ENCOUNTER — Other Ambulatory Visit: Payer: Self-pay | Admitting: Family Medicine

## 2014-06-18 NOTE — Telephone Encounter (Signed)
Needs office follow-up. Refill once until follow-up

## 2014-06-18 NOTE — Telephone Encounter (Signed)
Last visit 07/17/13 Last refill 05/19/14 #240 0 refill

## 2014-07-04 ENCOUNTER — Other Ambulatory Visit: Payer: Self-pay | Admitting: Gastroenterology

## 2014-07-09 ENCOUNTER — Other Ambulatory Visit: Payer: Self-pay | Admitting: Orthopedic Surgery

## 2014-07-09 DIAGNOSIS — M25512 Pain in left shoulder: Secondary | ICD-10-CM

## 2014-07-09 DIAGNOSIS — M25531 Pain in right wrist: Secondary | ICD-10-CM

## 2014-07-16 ENCOUNTER — Telehealth: Payer: Self-pay | Admitting: Family Medicine

## 2014-07-16 NOTE — Telephone Encounter (Signed)
Last visit 07/17/14 Last refill 06/18/14 #240 0 refill

## 2014-07-16 NOTE — Telephone Encounter (Signed)
Patient need re-fill on traMADol (ULTRAM) 50 MG tablet  [id number] [place], [place] - [address]

## 2014-07-16 NOTE — Telephone Encounter (Signed)
Needs office follow up. May refill once by the 26th.

## 2014-07-18 MED ORDER — TRAMADOL HCL 50 MG PO TABS: ORAL_TABLET | ORAL | Status: DC

## 2014-07-18 NOTE — Telephone Encounter (Signed)
Rx called into pharmacy. Can you please call patient she needs to set up for a either a physical exam or follow up on meds.

## 2014-07-21 ENCOUNTER — Ambulatory Visit
Admission: RE | Admit: 2014-07-21 | Discharge: 2014-07-21 | Disposition: A | Discharge status: Home / Self Care | Source: Ambulatory Visit | Attending: Orthopedic Surgery | Admitting: Orthopedic Surgery

## 2014-07-21 DIAGNOSIS — M25531 Pain in right wrist: Secondary | ICD-10-CM

## 2014-07-21 DIAGNOSIS — M25512 Pain in left shoulder: Secondary | ICD-10-CM

## 2014-07-24 NOTE — Telephone Encounter (Signed)
Pt has been scheduled.  °

## 2014-08-06 ENCOUNTER — Encounter: Payer: Self-pay | Admitting: Family Medicine

## 2014-08-06 ENCOUNTER — Ambulatory Visit (INDEPENDENT_AMBULATORY_CARE_PROVIDER_SITE_OTHER): Admitting: Family Medicine

## 2014-08-06 VITALS — BP 118/70 | HR 72 | Temp 98.6°F | Wt 152.0 lb

## 2014-08-06 DIAGNOSIS — E785 Hyperlipidemia, unspecified: Secondary | ICD-10-CM

## 2014-08-06 DIAGNOSIS — I10 Essential (primary) hypertension: Secondary | ICD-10-CM

## 2014-08-06 DIAGNOSIS — E038 Other specified hypothyroidism: Secondary | ICD-10-CM

## 2014-08-06 LAB — BASIC METABOLIC PANEL
BUN: 16 mg/dL (ref 6–23)
CO2: 31 mEq/L (ref 19–32)
Calcium: 10.6 mg/dL — ABNORMAL HIGH (ref 8.4–10.5)
Chloride: 101 mEq/L (ref 96–112)
Creatinine, Ser: 0.72 mg/dL (ref 0.40–1.20)
GFR: 88.23 mL/min (ref >60.00)
Glucose, Bld: 108 mg/dL — ABNORMAL HIGH (ref 70–99)
Potassium: 4.8 mEq/L (ref 3.5–5.1)
Sodium: 139 mEq/L (ref 135–145)

## 2014-08-06 LAB — TSH: TSH: 1.77 u[IU]/mL (ref 0.35–4.50)

## 2014-08-06 MED ORDER — ROSUVASTATIN CALCIUM 5 MG PO TABS: 5.0000 mg | ORAL_TABLET | Freq: Every day | ORAL | Status: DC

## 2014-08-06 NOTE — Progress Notes (Signed)
Pre visit review using our clinic review tool, if applicable. No additional management support is needed unless otherwise documented below in the visit note. 

## 2014-08-06 NOTE — Progress Notes (Signed)
   Subjective:    Patient ID: Kimberly Stein, female    DOB: March 17, 1956, 59 y.o.   MRN: [id number]  HPI Patient seen for medical follow-up. She has multiple chronic problems including hyperlipidemia, hypothyroidism, hypertension, GERD, fibromyalgia, chronic back pain. She is currently followed by orthopedics and chronic pain management. The following are addressed today  Hypothyroidism. She takes her thyroid medication regularly. Last labs were over one year ago. No cold intolerance. No major constipation issues.  Hypertension which is been stable. She remains on losartan and chlorthalidone along with low-dose potassium replacement. No dizziness.  Hyperlipidemia. Previous intolerance to Crestor and Lipitor. She had myalgias with both. She is willing to retry low-dose Crestor again. She had muscle aches when taking this 5 mg once daily  She had recent [employer] screening which is all basically normal. She still smokes. Low motivation to quit   Review of Systems  Constitutional: Negative for fatigue.  Eyes: Negative for visual disturbance.  Respiratory: Negative for cough, chest tightness, shortness of breath and wheezing.   Cardiovascular: Negative for chest pain, palpitations and leg swelling.  Musculoskeletal: Positive for back pain and arthralgias.  Neurological: Negative for dizziness, seizures, syncope, weakness, light-headedness and headaches.       Objective:   Physical Exam  Constitutional: She appears well-developed and well-nourished. No distress.  Neck: Neck supple. No thyromegaly present.  Cardiovascular: Normal rate and regular rhythm.  Exam reveals no gallop.   Pulmonary/Chest: Effort normal and breath sounds normal. No respiratory distress. She has no wheezes. She has no rales.  Musculoskeletal: She exhibits no edema.  Lymphadenopathy:    She has no cervical adenopathy.  Psychiatric: She has a normal mood and affect. Her behavior is normal.          Assessment &  Plan:  #1 hypothyroidism. Recheck TSH #2 hypertension. Well controlled. Check basic metabolic panel. Continue current medications  #3 history of dyslipidemia. History of severe hypercholesterolemia. Retry low-dose Crestor 5 mg every Monday and Friday. If tolerating that after a few weeks try gradually increasing this to one 3 times weekly.

## 2014-08-07 ENCOUNTER — Telehealth: Payer: Self-pay | Admitting: Family Medicine

## 2014-08-07 ENCOUNTER — Other Ambulatory Visit: Payer: Self-pay | Admitting: Family Medicine

## 2014-08-07 DIAGNOSIS — I10 Essential (primary) hypertension: Secondary | ICD-10-CM

## 2014-08-07 NOTE — Telephone Encounter (Signed)
emmi emailed °

## 2014-08-11 ENCOUNTER — Other Ambulatory Visit: Payer: Self-pay | Admitting: Gastroenterology

## 2014-08-11 ENCOUNTER — Other Ambulatory Visit: Payer: Self-pay | Admitting: Family Medicine

## 2014-08-12 ENCOUNTER — Other Ambulatory Visit: Payer: Self-pay | Admitting: Family Medicine

## 2014-08-12 NOTE — Telephone Encounter (Signed)
Last visit 08/06/14 Last refill 07/18/14 #240 0 refill

## 2014-08-13 ENCOUNTER — Other Ambulatory Visit: Payer: Self-pay | Admitting: Family Medicine

## 2014-08-13 ENCOUNTER — Telehealth: Payer: Self-pay | Admitting: Family Medicine

## 2014-08-13 NOTE — Telephone Encounter (Signed)
PA for Crestor was denied.  Denial states:  Coverage is provided in situations where: (1) the patient requires an LDL cholesterol decrease of 30% to %50 form the pre-treatment LDL evel AND has tried ALL of the following and was unable to tolerate treatment due to adverse effects: atorvastatin 10 mg or greater, simvastatin 20 mg or greater, and pravastatin 40 mg or greater:  OR (1) the patient is taking a concurrent drug that is metabolized by the cytochrome P450 3A4 pathway AND cannot use pravastatin.

## 2014-08-13 NOTE — Telephone Encounter (Signed)
Too early.  Should not be exceeding 8 tablets daily.  She got 240 on 07-18-14.  May refill by 3-24 (since we are closed on the 25th)

## 2014-08-14 NOTE — Telephone Encounter (Signed)
Pt states that she has taking Pravastatin before. Pt states that she does have enough Crestor right now.

## 2014-08-14 NOTE — Telephone Encounter (Signed)
Has she tried Pravastatin? If not, Pravastatin 20 mg once daily

## 2014-08-19 ENCOUNTER — Encounter: Payer: Self-pay | Admitting: Gastroenterology

## 2014-09-12 ENCOUNTER — Telehealth: Payer: Self-pay | Admitting: Family Medicine

## 2014-09-12 ENCOUNTER — Other Ambulatory Visit: Payer: Self-pay | Admitting: Family Medicine

## 2014-09-12 NOTE — Telephone Encounter (Signed)
Pt said she has tried taking the following med twice a week and she still can not tolerate it   rosuvastatin (CRESTOR) 5 MG tablet  She said if he want to try her on something she has not tried to let her know

## 2014-09-12 NOTE — Telephone Encounter (Signed)
Last OV 08/06/14

## 2014-09-15 NOTE — Telephone Encounter (Signed)
Would not at this time.  She has failed other statins in past.

## 2014-09-15 NOTE — Telephone Encounter (Signed)
Pt informed

## 2014-09-16 NOTE — Telephone Encounter (Signed)
Refill for 6 months. 

## 2014-10-10 ENCOUNTER — Ambulatory Visit: Admitting: Gastroenterology

## 2014-10-22 ENCOUNTER — Other Ambulatory Visit: Payer: Self-pay | Admitting: Family Medicine

## 2014-11-12 ENCOUNTER — Other Ambulatory Visit: Payer: Self-pay | Admitting: Family Medicine

## 2014-12-03 ENCOUNTER — Ambulatory Visit (INDEPENDENT_AMBULATORY_CARE_PROVIDER_SITE_OTHER): Admitting: Gastroenterology

## 2014-12-03 ENCOUNTER — Encounter: Payer: Self-pay | Admitting: Gastroenterology

## 2014-12-03 VITALS — BP 112/84 | HR 90 | Ht 62.0 in | Wt 151.1 lb

## 2014-12-03 DIAGNOSIS — Z86010 Personal history of colonic polyps: Secondary | ICD-10-CM

## 2014-12-03 DIAGNOSIS — K900 Celiac disease: Secondary | ICD-10-CM

## 2014-12-03 DIAGNOSIS — K219 Gastro-esophageal reflux disease without esophagitis: Secondary | ICD-10-CM | POA: Diagnosis not present

## 2014-12-03 NOTE — Patient Instructions (Signed)
It has been recommended to you by your physician that you have a(n) Colonoscopy completed. Your recall Colonoscopy was 01/2014.  Per your request, we did not schedule the procedure(s) today. Please contact our office at [phone] should you decide to have the procedure completed.  Thank you for choosing me and [facility].  Pricilla Riffle. Dagoberto Ligas., MD., [facility]

## 2014-12-03 NOTE — Progress Notes (Signed)
    History of Present Illness: This is a 59 year old female returning for follow-up of GERD, celiac disease and a history of tubovillous adenomatous colon polyps. Her reflux symptoms remain under very good control on omeprazole 20 mg twice daily. She states ongoing compliance with her gluten-free diet. Duodenal biopsies performed in 2012 showed no evidence of celiac disease. She is overdue for recommended surveillance colonoscopy would like to postpone this a few more months due to upcoming plans for shoulder surgery.  Current Medications, Allergies, Past Medical History, Past Surgical History, Family History and Social History were reviewed in [employer] record.  Physical Exam: General: Well developed , well nourished, no acute distress Head: Normocephalic and atraumatic Eyes:  sclerae anicteric, EOMI Ears: Normal auditory acuity Mouth: No deformity or lesions Lungs: Clear throughout to auscultation Heart: Regular rate and rhythm; no murmurs, rubs or bruits Abdomen: Soft, non tender and non distended. No masses, hepatosplenomegaly or hernias noted. Normal Bowel sounds Musculoskeletal: Symmetrical with no gross deformities  Pulses:  Normal pulses noted Extremities: No clubbing, cyanosis, edema or deformities noted Neurological: Alert oriented x 4, grossly nonfocal Psychological:  Alert and cooperative. Normal mood and affect  Assessment and Recommendations:  1. Celiac disease. Well controlled. Maintain gluten-free diet.   2. GERD. Continue omeprazole 20 mg bid and standard antireflux measures.   3. Personal history of tubulovillous adenomatous colon polyp. Surveillance colonoscopy was due in September 2015. She is advised she is overdue. She states she will schedule within the next few months after her shoulder surgery.  15 minutes of face-to-face time spent with the patient. Greater than 50% of the time was spent counseling and coordinating care.

## 2014-12-04 ENCOUNTER — Other Ambulatory Visit: Payer: Self-pay | Admitting: Gastroenterology

## 2014-12-06 ENCOUNTER — Other Ambulatory Visit: Payer: Self-pay | Admitting: Family Medicine

## 2014-12-29 ENCOUNTER — Encounter: Payer: Self-pay | Admitting: Family Medicine

## 2014-12-29 ENCOUNTER — Ambulatory Visit (INDEPENDENT_AMBULATORY_CARE_PROVIDER_SITE_OTHER): Admitting: Family Medicine

## 2014-12-29 VITALS — BP 126/80 | HR 92 | Temp 98.5°F | Wt 146.0 lb

## 2014-12-29 DIAGNOSIS — R079 Chest pain, unspecified: Secondary | ICD-10-CM | POA: Diagnosis not present

## 2014-12-29 NOTE — Progress Notes (Signed)
Pre visit review using our clinic review tool, if applicable. No additional management support is needed unless otherwise documented below in the visit note. 

## 2014-12-29 NOTE — Progress Notes (Addendum)
Subjective:    Patient ID: Kimberly Stein, female    DOB: [birth year], 59 y.o.   MRN: [id number]  HPI  Patient seen with episode Saturday night during her sleep and waking up with chest tightness mid substernal which last about 10 minutes. She denied associated dyspnea. She denied any reflux symptoms. She's been having some chronic left shoulder pains and is been followed by orthopedics for that. She denied any left upper extremity pain at the time. No nausea or vomiting. Did feel slightly diaphoretic. She has not had any prior heart history but did have a grandparent premature CAD. She's had no chest pain whatsoever since then. She's not doing any consistent exercise. She does smoke and has history of hyperlipidemia and hypertension.  Past Medical History  Diagnosis Date  . Allergy     sinus infections  . Anemia   . Arthritis   . Cancer     skin  . GERD (gastroesophageal reflux disease)   . Heart murmur   . Meniere's disease   . Neuromuscular disorder     fibromyalgia  . Thyroid disease     hypothyroid  . 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 Celiac disease    Past Surgical History  Procedure Laterality Date  . Total abdominal hysterectomy    . Cholecystectomy    . Colonoscopy    . Upper gastrointestinal endoscopy    . Dilation and curettage of uterus    . Cervical disc surgery    . Mouth surgery  2016    reports that she has been smoking Cigarettes.  She has been smoking about 1.00 pack per day. She has never used smokeless tobacco. She reports that she does not drink alcohol or use illicit drugs. family history includes Celiac disease in her mother; Diabetes in her mother; Heart disease in her mother; Prostate cancer in her father. There is no history of Colon cancer, Esophageal cancer, or Stomach cancer. Allergies  Allergen Reactions  . Vicodin [Hydrocodone-Acetaminophen] Itching     Review of Systems  Constitutional: Negative for appetite change, fatigue and  unexpected weight change.  Eyes: Negative for visual disturbance.  Respiratory: Negative for cough, chest tightness, shortness of breath and wheezing.   Cardiovascular: Negative for palpitations and leg swelling.  Gastrointestinal: Negative for abdominal pain.  Genitourinary: Negative for dysuria.  Neurological: Negative for dizziness, seizures, syncope, weakness, light-headedness and headaches.       Objective:   Physical Exam  Constitutional: She appears well-developed and well-nourished.  Neck: Neck supple. No thyromegaly present.  Cardiovascular: Normal rate and regular rhythm.  Exam reveals no gallop.   No murmur heard. Pulmonary/Chest: Effort normal and breath sounds normal. No respiratory distress. She has no wheezes. She has no rales.  Musculoskeletal: She exhibits no edema.          Assessment & Plan:  Chest pain. Symptoms occurred at rest and she has not had any further episodes since then she does have at least moderate risk factors. We do not have any clear explanation of what caused her pain. Start with EKG. Consider nuclear stress test to further evaluate especially with her risk factors  EKG sinus rhythm with no acute findings. Set up exercise nuclear stress evaluation  She also is looking at upcoming elective left shoulder surgery and would need to clarify with that on the horizon  Nuclear stress test:  Nuclear stress EF: 54%.  There was no ST segment deviation noted during stress.  The left ventricular  ejection fraction is mildly decreased (45-54%).  This is a low risk study.  Intermediate risk stress nuclear study with a small, moderate intensity, reversible apical defect and a small, moderate, reversible inferior lateral defect; findings consistent with mild apical and inferior lateral ischemia; EF 54.  Patient notified of results. No recurrent symptoms. Recommend setting up cardiology evaluation to further advise. She is strongly encouraged to stop  smoking. Follow-up immediately for any recurrent symptoms

## 2014-12-29 NOTE — Patient Instructions (Signed)

## 2014-12-30 ENCOUNTER — Encounter: Payer: Self-pay | Admitting: Family Medicine

## 2015-01-03 ENCOUNTER — Other Ambulatory Visit: Payer: Self-pay | Admitting: Family Medicine

## 2015-01-08 ENCOUNTER — Telehealth (HOSPITAL_COMMUNITY): Payer: Self-pay

## 2015-01-08 NOTE — Telephone Encounter (Signed)
Patient given detailed instructions per Myocardial Perfusion Study Information Sheet for test on 01-13-2015 at 0830. Patient Notified to arrive 15 minutes early, and that it is imperative to arrive on time for appointment to keep from having the test rescheduled. Patient verbalized understanding. Oletta Lamas, Jermey Closs A

## 2015-01-13 ENCOUNTER — Ambulatory Visit (HOSPITAL_COMMUNITY): Attending: Cardiovascular Disease

## 2015-01-13 DIAGNOSIS — R9439 Abnormal result of other cardiovascular function study: Secondary | ICD-10-CM | POA: Diagnosis not present

## 2015-01-13 DIAGNOSIS — R079 Chest pain, unspecified: Secondary | ICD-10-CM | POA: Diagnosis not present

## 2015-01-13 DIAGNOSIS — I10 Essential (primary) hypertension: Secondary | ICD-10-CM | POA: Insufficient documentation

## 2015-01-13 LAB — MYOCARDIAL PERFUSION IMAGING
LV dias vol: 72 mL
LV sys vol: 34 mL
Peak HR: 88 {beats}/min
RATE: 0.33
Rest HR: 55 {beats}/min
SDS: 2
SRS: 4
SSS: 6
TID: 0.92

## 2015-01-13 MED ORDER — TECHNETIUM TC 99M SESTAMIBI GENERIC - CARDIOLITE
10.1000 | Freq: Once | INTRAVENOUS | Status: AC | PRN
  Administered 2015-01-13: 10.1 via INTRAVENOUS

## 2015-01-13 MED ORDER — REGADENOSON 0.4 MG/5ML IV SOLN
0.4000 mg | Freq: Once | INTRAVENOUS | Status: AC
  Administered 2015-01-13: 0.4 mg via INTRAVENOUS

## 2015-01-13 MED ORDER — TECHNETIUM TC 99M SESTAMIBI GENERIC - CARDIOLITE
31.8000 | Freq: Once | INTRAVENOUS | Status: AC | PRN
  Administered 2015-01-13: 31.8 via INTRAVENOUS

## 2015-01-15 NOTE — Addendum Note (Signed)
Addended by: Eulas Post on: 01/15/2015 08:49 AM   Modules accepted: Orders

## 2015-01-19 ENCOUNTER — Other Ambulatory Visit: Payer: Self-pay | Admitting: Family Medicine

## 2015-02-13 ENCOUNTER — Ambulatory Visit (INDEPENDENT_AMBULATORY_CARE_PROVIDER_SITE_OTHER): Admitting: Cardiovascular Disease

## 2015-02-13 ENCOUNTER — Encounter: Payer: Self-pay | Admitting: Cardiovascular Disease

## 2015-02-13 VITALS — BP 114/76 | HR 63 | Ht 63.0 in | Wt 148.1 lb

## 2015-02-13 DIAGNOSIS — D689 Coagulation defect, unspecified: Secondary | ICD-10-CM

## 2015-02-13 DIAGNOSIS — Z79899 Other long term (current) drug therapy: Secondary | ICD-10-CM

## 2015-02-13 DIAGNOSIS — I25118 Atherosclerotic heart disease of native coronary artery with other forms of angina pectoris: Secondary | ICD-10-CM | POA: Diagnosis not present

## 2015-02-13 DIAGNOSIS — Z01818 Encounter for other preprocedural examination: Secondary | ICD-10-CM

## 2015-02-13 DIAGNOSIS — I208 Other forms of angina pectoris: Secondary | ICD-10-CM | POA: Diagnosis not present

## 2015-02-13 DIAGNOSIS — Z13220 Encounter for screening for lipoid disorders: Secondary | ICD-10-CM

## 2015-02-13 MED ORDER — METOPROLOL TARTRATE 25 MG PO TABS: 12.5000 mg | ORAL_TABLET | Freq: Two times a day (BID) | ORAL | Status: DC

## 2015-02-13 MED ORDER — CHLORTHALIDONE 25 MG PO TABS: 25.0000 mg | ORAL_TABLET | Freq: Every day | ORAL | Status: DC

## 2015-02-13 MED ORDER — ASPIRIN EC 81 MG PO TBEC: 81.0000 mg | DELAYED_RELEASE_TABLET | Freq: Every day | ORAL | Status: DC

## 2015-02-13 NOTE — Progress Notes (Signed)
Cardiology Office Note   Date:  02/13/15  ID:  Kimberly Stein, DOB 1956/01/11, MRN [id number]  PCP:  Eulas Post, MD  Cardiologist:   Sharol Harness, MD   Chief Complaint  Patient presents with  . New Evaluation    chest tightness and sweating//happened one night, lasted 10 minutes//stress test (01/13/15)//pt states no Sx since then      History of Present Illness: Kimberly Stein is a 59 y.o. female with hypertension, hyperlipidemia and tobacco abuse who presents for an evaluation of chest pain and an abnormal stress test.  Kimberly Stein reports an episode of chest pain 6-7 weeks ago that occurred that lasted for 10 minutes.  She described it as intense chest tightness and was associated with sweating.  There was no shortness of breath, nausea, or radiation.  Kimberly Stein was evaluated by her PCP, Dr. [name] on 8/8.  At that appointment her ECG was unremarkable.  She was preferred for [facility] which showed an LVEF of 54% with a small, moderate intensity, reversible apical defect and a small, moderate reversible inferolateral defect consistent with mild apical and inferolateral ischemia.   Prior to this summer, Kimberly Stein was walking up to three miles per day.  However, recently she has been limited by hip arthritis.  She also reports L shoulder pain and R wrist pain.  She is awaiting R wrist surgery for carpel tunnel.  She does not get any formal exercise.  She is able to clean her house without chest pain or shortness of breath.  She denies LE edema, orthopnea, or PND.  She reports occasional heart fluttering but denies associated lightheadedness or dizziness.  She continues to smoke one pack of cigarettes per day.  She has never tried to quit and is not interested at this time.  She states that she is "high strung" and it calms her down.  Past Medical History  Diagnosis Date  . Allergy     sinus infections  . Anemia   . Arthritis   . Cancer     skin  .  GERD (gastroesophageal reflux disease)   . Heart murmur   . Meniere's disease   . Neuromuscular disorder     fibromyalgia  . Thyroid disease     hypothyroid  . 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 Celiac disease     Past Surgical History  Procedure Laterality Date  . Total abdominal hysterectomy    . Cholecystectomy    . Colonoscopy    . Upper gastrointestinal endoscopy    . Dilation and curettage of uterus    . Cervical disc surgery    . Mouth surgery  2016     Current Outpatient Prescriptions  Medication Sig Dispense Refill  . cyclobenzaprine (FLEXERIL) 10 MG tablet Take 10 mg by mouth at bedtime.     [name] estradiol (ESTRACE) 1 MG tablet TAKE 1 TABLET BY MOUTH EVERY DAY 90 tablet 2  . gabapentin (NEURONTIN) 300 MG capsule 2 every evening    . KLOR-CON M10 10 MEQ tablet TAKE 1 TABLET BY MOUTH TWICE A DAY 60 tablet 5  . levothyroxine (SYNTHROID, LEVOTHROID) 150 MCG tablet TAKE 1 TABLET BY MOUTH EVERY DAY BEFORE BREAKFAST 30 tablet 5  . losartan (COZAAR) 50 MG tablet TAKE 1 TABLET BY MOUTH EVERY DAY 90 tablet 2  . omeprazole (PRILOSEC) 20 MG capsule TAKE ONE CAPSULE BY MOUTH TWICE A DAY 180 capsule 3  . Oxycodone HCl 10 MG  TABS Take 1 tablet daily at bedtime.  0  . PROAIR HFA 108 (90 BASE) MCG/ACT inhaler INHALE 2 PUFFS BY MOUTH EVERY 4-6 HOURS AS NEEDED 8.5 Inhaler 3  . traMADol (ULTRAM) 50 MG tablet TAKE 2 TABLETS EVERY 6 HOURS AS NEEDED FOR PAIN MAX OF 8 PER DAY 240 tablet 5  . VOLTAREN 1 % GEL Place on area 3 times daily. Per Neurologist  3  . aspirin EC 81 MG tablet Take 1 tablet (81 mg total) by mouth daily. 90 tablet 3  . chlorthalidone (HYGROTON) 25 MG tablet Take 1 tablet (25 mg total) by mouth daily. 30 tablet 11  . metoprolol tartrate (LOPRESSOR) 25 MG tablet Take 0.5 tablets (12.5 mg total) by mouth 2 (two) times daily. 30 tablet 11   No current facility-administered medications for this visit.    Allergies:   Vicodin    Social History:  The patient  reports  that she has been smoking Cigarettes.  She has been smoking about 1.00 pack per day. She has never used smokeless tobacco. She reports that she does not drink alcohol or use illicit drugs.   Family History:  The patient's family history includes Celiac disease in her mother; Diabetes in her mother; Heart disease in her mother; Prostate cancer in her father. There is no history of Colon cancer, Esophageal cancer, or Stomach cancer.    ROS:  Please see the history of present illness.   Otherwise, review of systems are positive for none.   All other systems are reviewed and negative.    PHYSICAL EXAM: VS:  BP 114/76 mmHg  Pulse 63  Ht 5' 3"  (1.6 m)  Wt 67.178 kg (148 lb 1.6 oz)  BMI 26.24 kg/m2 , BMI Body mass index is 26.24 kg/(m^2). GENERAL:  Well appearing HEENT:  Pupils equal round and reactive, fundi not visualized, oral mucosa unremarkable NECK:  No jugular venous distention, waveform within normal limits, carotid upstroke brisk and symmetric, no bruits, no thyromegaly LYMPHATICS:  No cervical adenopathy LUNGS:  Clear to auscultation bilaterally HEART:  RRR.  PMI not displaced or sustained,S1 and S2 within normal limits, no S3, no S4, no clicks, no rubs, no murmurs ABD:  Flat, positive bowel sounds normal in frequency in pitch, no bruits, no rebound, no guarding, no midline pulsatile mass, no hepatomegaly, no splenomegaly EXT:  2 plus pulses throughout, no edema, no cyanosis no clubbing SKIN:  No rashes no nodules NEURO:  Cranial nerves II through XII grossly intact, motor grossly intact throughout PSYCH:  Cognitively intact, oriented to person place and time    EKG:  EKG is ordered today. The ekg ordered today demonstrates sinus rhythm at 63 bpm.    Recent Labs: 08/06/2014: BUN 16; Creatinine, Ser 0.72; Potassium 4.8; Sodium 139; TSH 1.77    Lipid Panel    Component Value Date/Time   CHOL 182 09/13/2013 0858   TRIG 157.0* 09/13/2013 0858   HDL 61.70 09/13/2013 0858    CHOLHDL 3 09/13/2013 0858   VLDL 31.4 09/13/2013 0858   LDLCALC 89 09/13/2013 0858   LDLDIRECT 267.7 06/26/2013 1206      Wt Readings from Last 3 Encounters:  02/13/15 67.178 kg (148 lb 1.6 oz)  01/13/15 66.225 kg (146 lb)  12/29/14 66.225 kg (146 lb)      Other studies Reviewed: Additional studies/ records that were reviewed today include: . Review of the above records demonstrates:  Please see elsewhere in the note.     ASSESSMENT AND PLAN:  #  CCS Class IV Angina: Kimberly Stein had an episode of angina at rest that led to stress testing that showed two areas of mild ischemia.  Given that she had symptoms and had ischemia on stress, we will refer her fo cardiac catheterization.  She has several upcoming surgeries, so BMS would be best if possible.  - Start aspirin 19m daily - Check lipids and start atorvastatin 853mdaily - Pre-cath labs (CXR, CMP, CBC, Coags) - Start metoprolol 12.67m667mid  # Hypertension: BP well-controlled.  Would like to decrease chlorthalidone and start metoprolol as above.  She takes 267m26mily and an extra 267mg26mn the symptoms of her Meniere's disease flare.  We will switch it to just 267mg 21my and may need to reduce it further to allow titration of her beta blocker.  Continue losartan.  # Tobacco abuse: Kimberly Stein interest in quitting tobacco at this time.  She was advised of its risks to her health and options including medications, patches and gum.  Current medicines are reviewed at length with the patient today.  The patient does not have concerns regarding medicines.  The following changes have been made:  Start aspirin, metoprolol, atorvastatin and metoprolol.  Chlorthalidone was decreased to once per day.  Labs/ tests ordered today include:   Orders Placed This Encounter  Procedures  . DG Chest 2 View  . Comprehensive metabolic panel  . APTT  . Protime-INR  . CBC  . Lipid panel  . EKG 12-Lead  . LEFT HEART CATHETERIZATION WITH  CORONARY ANGIOGRAM     Disposition:   FU with Kimberly Diviney C. RandolOval Linseyfter cath.    Signed, RandolSharol Harness9/24/2016 8:55 AM    [facility]

## 2015-02-13 NOTE — Patient Instructions (Signed)
Left heart cath with Dr Jaclyn Shaggy physician has requested that you have a cardiac catheterization. Cardiac catheterization is used to diagnose and/or treat various heart conditions. Doctors may recommend this procedure for a number of different reasons. The most common reason is to evaluate chest pain. Chest pain can be a symptom of coronary artery disease (CAD), and cardiac catheterization can show whether plaque is narrowing or blocking your heart's arteries. This procedure is also used to evaluate the valves, as well as measure the blood flow and oxygen levels in different parts of your heart. For further information please visit [email or web address]. Please follow instruction sheet, as given.  Labs -CMP , PT,PTT, CBC,LIPIDS- NOTHING TO EAT OR DRINK THE MORNING OF THE TEST. GO TO [address]  [address] CHEST XRAY -GO TO [address]    START ASPIRIN 81 MG ONE TABLET DAILY  START METOPROLOL  TART 12.5 MG  ( 1/2 TABLET OF 25 MG TABLET) TWICE A DAY  DECREASE CHLORITADONE 25 MG  ONE TABLET DAILY.   Your physician recommends that you schedule a follow-up appointment in [place].

## 2015-02-14 ENCOUNTER — Encounter: Payer: Self-pay | Admitting: Cardiovascular Disease

## 2015-02-19 ENCOUNTER — Telehealth: Payer: Self-pay | Admitting: *Deleted

## 2015-02-19 ENCOUNTER — Ambulatory Visit
Admission: RE | Admit: 2015-02-19 | Discharge: 2015-02-19 | Disposition: A | Discharge status: Home / Self Care | Source: Ambulatory Visit | Attending: Cardiovascular Disease | Admitting: Cardiovascular Disease

## 2015-02-19 DIAGNOSIS — I208 Other forms of angina pectoris: Secondary | ICD-10-CM

## 2015-02-19 DIAGNOSIS — Z79899 Other long term (current) drug therapy: Secondary | ICD-10-CM

## 2015-02-19 DIAGNOSIS — Z13220 Encounter for screening for lipoid disorders: Secondary | ICD-10-CM

## 2015-02-19 DIAGNOSIS — I25118 Atherosclerotic heart disease of native coronary artery with other forms of angina pectoris: Secondary | ICD-10-CM

## 2015-02-19 DIAGNOSIS — Z01818 Encounter for other preprocedural examination: Secondary | ICD-10-CM

## 2015-02-19 DIAGNOSIS — D689 Coagulation defect, unspecified: Secondary | ICD-10-CM

## 2015-02-19 LAB — COMPREHENSIVE METABOLIC PANEL
ALT: 14 U/L (ref 6–29)
AST: 16 U/L (ref 10–35)
Albumin: 4.2 g/dL (ref 3.6–5.1)
Alkaline Phosphatase: 59 U/L (ref 33–130)
BUN: 14 mg/dL (ref 7–25)
CO2: 29 mmol/L (ref 20–31)
Calcium: 9.8 mg/dL (ref 8.6–10.4)
Chloride: 101 mmol/L (ref 98–110)
Creat: 0.72 mg/dL (ref 0.50–1.05)
Glucose, Bld: 101 mg/dL — ABNORMAL HIGH (ref 65–99)
Potassium: 4.4 mmol/L (ref 3.5–5.3)
Sodium: 138 mmol/L (ref 135–146)
Total Bilirubin: 0.2 mg/dL (ref 0.2–1.2)
Total Protein: 7 g/dL (ref 6.1–8.1)

## 2015-02-19 LAB — CBC
HCT: 39.9 % (ref 36.0–46.0)
Hemoglobin: 14.1 g/dL (ref 12.0–15.0)
MCH: 32 pg (ref 26.0–34.0)
MCHC: 35.3 g/dL (ref 30.0–36.0)
MCV: 90.7 fL (ref 78.0–100.0)
MPV: 12.2 fL (ref 8.6–12.4)
Platelets: 214 10*3/uL (ref 150–400)
RBC: 4.4 MIL/uL (ref 3.87–5.11)
RDW: 13.5 % (ref 11.5–15.5)
WBC: 8.1 10*3/uL (ref 4.0–10.5)

## 2015-02-19 LAB — LDL CHOLESTEROL, DIRECT: Direct LDL: 168 mg/dL — ABNORMAL HIGH (ref <130)

## 2015-02-19 LAB — LIPID PANEL
Cholesterol: 303 mg/dL — ABNORMAL HIGH (ref 125–200)
HDL: 39 mg/dL — ABNORMAL LOW (ref >=46)
Total CHOL/HDL Ratio: 7.8 Ratio — ABNORMAL HIGH (ref <=5.0)
Triglycerides: 742 mg/dL — ABNORMAL HIGH (ref <150)

## 2015-02-19 LAB — PROTIME-INR
INR: 0.92 (ref <1.50)
Prothrombin Time: 12.5 seconds (ref 11.6–15.2)

## 2015-02-19 LAB — APTT: aPTT: 34 seconds (ref 24–37)

## 2015-02-19 NOTE — Telephone Encounter (Signed)
Spoke to patient. Result given . Verbalized understanding  Patient wanted Dr Oval Linsey -  ASPRIN 81 MG is irritating her stomach She states she takes after dinner and take crackers with it. She states she has GERDS.  Patient also wanted Doctor to know she gets 40 tablets of oxycodone a month at bedtime - to use if needed   Patient wanted know if she could use oxycodone the morning of cardiac cath if she needs it for her pain. RN informed patient will defer and contact her with the answer.patient verbalized understanding.

## 2015-02-19 NOTE — Telephone Encounter (Signed)
-----   Message from Skeet Latch, MD sent at 02/19/2015  9:27 AM EDT ----- Changes consistent with chronic bronchitis.  No acute findings.

## 2015-02-20 ENCOUNTER — Other Ambulatory Visit: Payer: Self-pay | Admitting: *Deleted

## 2015-02-20 DIAGNOSIS — Z01818 Encounter for other preprocedural examination: Secondary | ICD-10-CM

## 2015-02-22 NOTE — Telephone Encounter (Signed)
Please ask Kimberly Stein to take the aspirin in the morning or at lunch.  It may not be a good idea to take it right before bed if it worsens her GERD.  Yes, she can take her oxycodone as needed for her pain.

## 2015-02-23 NOTE — Telephone Encounter (Signed)
Spoke to patient  Information given ,patient verbalized understanding.

## 2015-02-25 ENCOUNTER — Encounter (HOSPITAL_COMMUNITY)
Admission: RE | Disposition: A | Discharge status: Home / Self Care | Payer: Self-pay | Source: Ambulatory Visit | Attending: Cardiology

## 2015-02-25 ENCOUNTER — Ambulatory Visit (HOSPITAL_COMMUNITY)
Admission: RE | Admit: 2015-02-25 | Discharge: 2015-02-25 | Disposition: A | Discharge status: Home / Self Care | Source: Ambulatory Visit | Attending: Cardiology | Admitting: Cardiology

## 2015-02-25 ENCOUNTER — Encounter (HOSPITAL_COMMUNITY): Payer: Self-pay | Admitting: Cardiology

## 2015-02-25 DIAGNOSIS — Z7982 Long term (current) use of aspirin: Secondary | ICD-10-CM | POA: Diagnosis not present

## 2015-02-25 DIAGNOSIS — F17210 Nicotine dependence, cigarettes, uncomplicated: Secondary | ICD-10-CM | POA: Diagnosis not present

## 2015-02-25 DIAGNOSIS — E785 Hyperlipidemia, unspecified: Secondary | ICD-10-CM | POA: Diagnosis not present

## 2015-02-25 DIAGNOSIS — M797 Fibromyalgia: Secondary | ICD-10-CM | POA: Insufficient documentation

## 2015-02-25 DIAGNOSIS — I10 Essential (primary) hypertension: Secondary | ICD-10-CM | POA: Insufficient documentation

## 2015-02-25 DIAGNOSIS — R079 Chest pain, unspecified: Secondary | ICD-10-CM | POA: Insufficient documentation

## 2015-02-25 DIAGNOSIS — Z8249 Family history of ischemic heart disease and other diseases of the circulatory system: Secondary | ICD-10-CM | POA: Insufficient documentation

## 2015-02-25 DIAGNOSIS — E039 Hypothyroidism, unspecified: Secondary | ICD-10-CM | POA: Diagnosis not present

## 2015-02-25 DIAGNOSIS — K219 Gastro-esophageal reflux disease without esophagitis: Secondary | ICD-10-CM | POA: Diagnosis not present

## 2015-02-25 DIAGNOSIS — D649 Anemia, unspecified: Secondary | ICD-10-CM | POA: Diagnosis not present

## 2015-02-25 DIAGNOSIS — K900 Celiac disease: Secondary | ICD-10-CM | POA: Insufficient documentation

## 2015-02-25 DIAGNOSIS — M1610 Unilateral primary osteoarthritis, unspecified hip: Secondary | ICD-10-CM | POA: Insufficient documentation

## 2015-02-25 DIAGNOSIS — R9439 Abnormal result of other cardiovascular function study: Secondary | ICD-10-CM | POA: Diagnosis not present

## 2015-02-25 DIAGNOSIS — R0789 Other chest pain: Secondary | ICD-10-CM | POA: Diagnosis present

## 2015-02-25 DIAGNOSIS — Z01818 Encounter for other preprocedural examination: Secondary | ICD-10-CM

## 2015-02-25 HISTORY — PX: CARDIAC CATHETERIZATION: SHX172

## 2015-02-25 SURGERY — LEFT HEART CATH AND CORONARY ANGIOGRAPHY
Anesthesia: LOCAL

## 2015-02-25 MED ORDER — SODIUM CHLORIDE 0.9 % IJ SOLN: 3.0000 mL | INTRAMUSCULAR | Status: DC | PRN

## 2015-02-25 MED ORDER — OXYMETAZOLINE HCL 0.05 % NA SOLN
1.0000 | Freq: Two times a day (BID) | NASAL | Status: DC | PRN
  Administered 2015-02-25: 1 via NASAL
  Filled 2015-02-25: qty 15

## 2015-02-25 MED ORDER — LIDOCAINE HCL (PF) 1 % IJ SOLN
INTRAMUSCULAR | Status: DC | PRN
  Administered 2015-02-25: 10:00:00

## 2015-02-25 MED ORDER — ASPIRIN 81 MG PO CHEW
CHEWABLE_TABLET | ORAL | Status: AC
  Administered 2015-02-25: 81 mg
  Filled 2015-02-25: qty 1

## 2015-02-25 MED ORDER — HEPARIN SODIUM (PORCINE) 1000 UNIT/ML IJ SOLN
INTRAMUSCULAR | Status: DC | PRN
  Administered 2015-02-25: 4000 [IU] via INTRAVENOUS

## 2015-02-25 MED ORDER — DIAZEPAM 5 MG PO TABS
ORAL_TABLET | ORAL | Status: AC
  Administered 2015-02-25: 5 mg via ORAL
  Filled 2015-02-25: qty 1

## 2015-02-25 MED ORDER — FENTANYL CITRATE (PF) 100 MCG/2ML IJ SOLN
INTRAMUSCULAR | Status: AC
  Filled 2015-02-25: qty 4

## 2015-02-25 MED ORDER — FENTANYL CITRATE (PF) 100 MCG/2ML IJ SOLN
INTRAMUSCULAR | Status: DC | PRN
  Administered 2015-02-25 (×2): 25 ug via INTRAVENOUS

## 2015-02-25 MED ORDER — MIDAZOLAM HCL 2 MG/2ML IJ SOLN
INTRAMUSCULAR | Status: DC | PRN
  Administered 2015-02-25: 2 mg via INTRAVENOUS
  Administered 2015-02-25: 1 mg via INTRAVENOUS

## 2015-02-25 MED ORDER — IOHEXOL 350 MG/ML SOLN
INTRAVENOUS | Status: DC | PRN
  Administered 2015-02-25: 45 mL via INTRACARDIAC

## 2015-02-25 MED ORDER — VERAPAMIL HCL 2.5 MG/ML IV SOLN
INTRAVENOUS | Status: AC
  Filled 2015-02-25: qty 2

## 2015-02-25 MED ORDER — VERAPAMIL HCL 2.5 MG/ML IV SOLN
INTRAVENOUS | Status: DC | PRN
  Administered 2015-02-25: 10:00:00 via INTRA_ARTERIAL

## 2015-02-25 MED ORDER — HEPARIN SODIUM (PORCINE) 1000 UNIT/ML IJ SOLN
INTRAMUSCULAR | Status: AC
  Filled 2015-02-25: qty 1

## 2015-02-25 MED ORDER — MIDAZOLAM HCL 2 MG/2ML IJ SOLN
INTRAMUSCULAR | Status: AC
Start: 2015-02-25 — End: 2015-02-25
  Filled 2015-02-25: qty 4

## 2015-02-25 MED ORDER — DIAZEPAM 5 MG PO TABS
5.0000 mg | ORAL_TABLET | Freq: Once | ORAL | Status: AC
  Administered 2015-02-25: 5 mg via ORAL

## 2015-02-25 MED ORDER — SODIUM CHLORIDE 0.9 % IV SOLN: 250.0000 mL | INTRAVENOUS | Status: DC | PRN

## 2015-02-25 MED ORDER — SODIUM CHLORIDE 0.9 % WEIGHT BASED INFUSION: 3.0000 mL/kg/h | INTRAVENOUS | Status: DC

## 2015-02-25 MED ORDER — SODIUM CHLORIDE 0.9 % IJ SOLN: 3.0000 mL | Freq: Two times a day (BID) | INTRAMUSCULAR | Status: DC

## 2015-02-25 MED ORDER — SODIUM CHLORIDE 0.9 % IV SOLN
INTRAVENOUS | Status: DC
  Administered 2015-02-25: 1000 mL via INTRAVENOUS

## 2015-02-25 MED ORDER — LIDOCAINE HCL (PF) 1 % IJ SOLN
INTRAMUSCULAR | Status: AC
  Filled 2015-02-25: qty 30

## 2015-02-25 MED ORDER — MIDAZOLAM HCL 2 MG/2ML IJ SOLN
INTRAMUSCULAR | Status: AC
  Filled 2015-02-25: qty 4

## 2015-02-25 SURGICAL SUPPLY — 15 items
CATH INFINITI 4FR JL3.5 (CATHETERS) ×2 IMPLANT
CATH INFINITI 5 FR JL3.5 (CATHETERS) ×2 IMPLANT
CATH INFINITI 5FR ANG PIGTAIL (CATHETERS) ×2 IMPLANT
CATH INFINITI JR4 5F (CATHETERS) ×2 IMPLANT
CATH INFINITI MULTIPACK ANG 4F (CATHETERS) ×2 IMPLANT
DEVICE RAD COMP TR BAND LRG (VASCULAR PRODUCTS) ×2 IMPLANT
GLIDESHEATH SLEND SS 6F .021 (SHEATH) ×2 IMPLANT
KIT HEART LEFT (KITS) ×2 IMPLANT
PACK CARDIAC CATHETERIZATION (CUSTOM PROCEDURE TRAY) ×2 IMPLANT
SYR MEDRAD MARK V 150ML (SYRINGE) ×2 IMPLANT
TRANSDUCER W/STOPCOCK (MISCELLANEOUS) ×2 IMPLANT
TUBING CIL FLEX 10 FLL-RA (TUBING) ×2 IMPLANT
WIRE EMERALD 3MM-J .035X150CM (WIRE) ×2 IMPLANT
WIRE HI TORQ VERSACORE-J 145CM (WIRE) ×2 IMPLANT
WIRE SAFE-T 1.5MM-J .035X260CM (WIRE) ×2 IMPLANT

## 2015-02-25 NOTE — H&P (View-Only) (Signed)
Cardiology Office Note   Date:  02/13/15  ID:  Kimberly Stein, DOB Oct 07, 1955, MRN [id number]  PCP:  Eulas Post, MD  Cardiologist:   Sharol Harness, MD   Chief Complaint  Patient presents with  . New Evaluation    chest tightness and sweating//happened one night, lasted 10 minutes//stress test (01/13/15)//pt states no Sx since then      History of Present Illness: Kimberly Stein is a 59 y.o. female with hypertension, hyperlipidemia and tobacco abuse who presents for an evaluation of chest pain and an abnormal stress test.  Kimberly Stein reports an episode of chest pain 6-7 weeks ago that occurred that lasted for 10 minutes.  She described it as intense chest tightness and was associated with sweating.  There was no shortness of breath, nausea, or radiation.  Kimberly Stein was evaluated by her PCP, Dr. [name] on 8/8.  At that appointment her ECG was unremarkable.  She was preferred for [facility] which showed an LVEF of 54% with a small, moderate intensity, reversible apical defect and a small, moderate reversible inferolateral defect consistent with mild apical and inferolateral ischemia.   Prior to this summer, Kimberly Stein was walking up to three miles per day.  However, recently she has been limited by hip arthritis.  She also reports L shoulder pain and R wrist pain.  She is awaiting R wrist surgery for carpel tunnel.  She does not get any formal exercise.  She is able to clean her house without chest pain or shortness of breath.  She denies LE edema, orthopnea, or PND.  She reports occasional heart fluttering but denies associated lightheadedness or dizziness.  She continues to smoke one pack of cigarettes per day.  She has never tried to quit and is not interested at this time.  She states that she is "high strung" and it calms her down.  Past Medical History  Diagnosis Date  . Allergy     sinus infections  . Anemia   . Arthritis   . Cancer     skin  .  GERD (gastroesophageal reflux disease)   . Heart murmur   . Meniere's disease   . Neuromuscular disorder     fibromyalgia  . Thyroid disease     hypothyroid  . 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 Celiac disease     Past Surgical History  Procedure Laterality Date  . Total abdominal hysterectomy    . Cholecystectomy    . Colonoscopy    . Upper gastrointestinal endoscopy    . Dilation and curettage of uterus    . Cervical disc surgery    . Mouth surgery  2016     Current Outpatient Prescriptions  Medication Sig Dispense Refill  . cyclobenzaprine (FLEXERIL) 10 MG tablet Take 10 mg by mouth at bedtime.     [name] estradiol (ESTRACE) 1 MG tablet TAKE 1 TABLET BY MOUTH EVERY DAY 90 tablet 2  . gabapentin (NEURONTIN) 300 MG capsule 2 every evening    . KLOR-CON M10 10 MEQ tablet TAKE 1 TABLET BY MOUTH TWICE A DAY 60 tablet 5  . levothyroxine (SYNTHROID, LEVOTHROID) 150 MCG tablet TAKE 1 TABLET BY MOUTH EVERY DAY BEFORE BREAKFAST 30 tablet 5  . losartan (COZAAR) 50 MG tablet TAKE 1 TABLET BY MOUTH EVERY DAY 90 tablet 2  . omeprazole (PRILOSEC) 20 MG capsule TAKE ONE CAPSULE BY MOUTH TWICE A DAY 180 capsule 3  . Oxycodone HCl 10 MG  TABS Take 1 tablet daily at bedtime.  0  . PROAIR HFA 108 (90 BASE) MCG/ACT inhaler INHALE 2 PUFFS BY MOUTH EVERY 4-6 HOURS AS NEEDED 8.5 Inhaler 3  . traMADol (ULTRAM) 50 MG tablet TAKE 2 TABLETS EVERY 6 HOURS AS NEEDED FOR PAIN MAX OF 8 PER DAY 240 tablet 5  . VOLTAREN 1 % GEL Place on area 3 times daily. Per Neurologist  3  . aspirin EC 81 MG tablet Take 1 tablet (81 mg total) by mouth daily. 90 tablet 3  . chlorthalidone (HYGROTON) 25 MG tablet Take 1 tablet (25 mg total) by mouth daily. 30 tablet 11  . metoprolol tartrate (LOPRESSOR) 25 MG tablet Take 0.5 tablets (12.5 mg total) by mouth 2 (two) times daily. 30 tablet 11   No current facility-administered medications for this visit.    Allergies:   Vicodin    Social History:  The patient  reports  that she has been smoking Cigarettes.  She has been smoking about 1.00 pack per day. She has never used smokeless tobacco. She reports that she does not drink alcohol or use illicit drugs.   Family History:  The patient's family history includes Celiac disease in her mother; Diabetes in her mother; Heart disease in her mother; Prostate cancer in her father. There is no history of Colon cancer, Esophageal cancer, or Stomach cancer.    ROS:  Please see the history of present illness.   Otherwise, review of systems are positive for none.   All other systems are reviewed and negative.    PHYSICAL EXAM: VS:  BP 114/76 mmHg  Pulse 63  Ht 5' 3"  (1.6 m)  Wt 67.178 kg (148 lb 1.6 oz)  BMI 26.24 kg/m2 , BMI Body mass index is 26.24 kg/(m^2). GENERAL:  Well appearing HEENT:  Pupils equal round and reactive, fundi not visualized, oral mucosa unremarkable NECK:  No jugular venous distention, waveform within normal limits, carotid upstroke brisk and symmetric, no bruits, no thyromegaly LYMPHATICS:  No cervical adenopathy LUNGS:  Clear to auscultation bilaterally HEART:  RRR.  PMI not displaced or sustained,S1 and S2 within normal limits, no S3, no S4, no clicks, no rubs, no murmurs ABD:  Flat, positive bowel sounds normal in frequency in pitch, no bruits, no rebound, no guarding, no midline pulsatile mass, no hepatomegaly, no splenomegaly EXT:  2 plus pulses throughout, no edema, no cyanosis no clubbing SKIN:  No rashes no nodules NEURO:  Cranial nerves II through XII grossly intact, motor grossly intact throughout PSYCH:  Cognitively intact, oriented to person place and time    EKG:  EKG is ordered today. The ekg ordered today demonstrates sinus rhythm at 63 bpm.    Recent Labs: 08/06/2014: BUN 16; Creatinine, Ser 0.72; Potassium 4.8; Sodium 139; TSH 1.77    Lipid Panel    Component Value Date/Time   CHOL 182 09/13/2013 0858   TRIG 157.0* 09/13/2013 0858   HDL 61.70 09/13/2013 0858    CHOLHDL 3 09/13/2013 0858   VLDL 31.4 09/13/2013 0858   LDLCALC 89 09/13/2013 0858   LDLDIRECT 267.7 06/26/2013 1206      Wt Readings from Last 3 Encounters:  02/13/15 67.178 kg (148 lb 1.6 oz)  01/13/15 66.225 kg (146 lb)  12/29/14 66.225 kg (146 lb)      Other studies Reviewed: Additional studies/ records that were reviewed today include: . Review of the above records demonstrates:  Please see elsewhere in the note.     ASSESSMENT AND PLAN:  #  CCS Class IV Angina: Kimberly Stein had an episode of angina at rest that led to stress testing that showed two areas of mild ischemia.  Given that she had symptoms and had ischemia on stress, we will refer her fo cardiac catheterization.  She has several upcoming surgeries, so BMS would be best if possible.  - Start aspirin 56m daily - Check lipids and start atorvastatin 893mdaily - Pre-cath labs (CXR, CMP, CBC, Coags) - Start metoprolol 12.67m54mid  # Hypertension: BP well-controlled.  Would like to decrease chlorthalidone and start metoprolol as above.  She takes 267m84mily and an extra 267mg32mn the symptoms of her Meniere's disease flare.  We will switch it to just 267mg 52my and may need to reduce it further to allow titration of her beta blocker.  Continue losartan.  # Tobacco abuse: Kimberly Stein interest in quitting tobacco at this time.  She was advised of its risks to her health and options including medications, patches and gum.  Current medicines are reviewed at length with the patient today.  The patient does not have concerns regarding medicines.  The following changes have been made:  Start aspirin, metoprolol, atorvastatin and metoprolol.  Chlorthalidone was decreased to once per day.  Labs/ tests ordered today include:   Orders Placed This Encounter  Procedures  . DG Chest 2 View  . Comprehensive metabolic panel  . APTT  . Protime-INR  . CBC  . Lipid panel  . EKG 12-Lead  . LEFT HEART CATHETERIZATION WITH  CORONARY ANGIOGRAM     Disposition:   FU with Genova Kiner C. RandolOval Linseyfter cath.    Signed, RandolSharol Harness9/24/2016 8:55 AM    [facility]

## 2015-02-25 NOTE — Discharge Instructions (Signed)
Radial Site Care °Refer to this sheet in the next few weeks. These instructions provide you with information about caring for yourself after your procedure. Your health care provider may also give you more specific instructions. Your treatment has been planned according to current medical practices, but problems sometimes occur. Call your health care provider if you have any problems or questions after your procedure. °WHAT TO EXPECT AFTER THE PROCEDURE °After your procedure, it is typical to have the following: °· Bruising at the radial site that usually fades within 1-2 weeks. °· Blood collecting in the tissue (hematoma) that may be painful to the touch. It should usually decrease in size and tenderness within 1-2 weeks. °HOME CARE INSTRUCTIONS °· Take medicines only as directed by your health care provider. °· You may shower 24-48 hours after the procedure or as directed by your health care provider. Remove the bandage (dressing) and gently wash the site with plain soap and water. Pat the area dry with a clean towel. Do not rub the site, because this may cause bleeding. °· Do not take baths, swim, or use a hot tub until your health care provider approves. °· Check your insertion site every day for redness, swelling, or drainage. °· Do not apply powder or lotion to the site. °· Do not flex or bend the affected arm for 24 hours or as directed by your health care provider. °· Do not push or pull heavy objects with the affected arm for 24 hours or as directed by your health care provider. °· Do not lift over 10 lb (4.5 kg) for 5 days after your procedure or as directed by your health care provider. °· Ask your health care provider when it is okay to: °¨ Return to work or school. °¨ Resume usual physical activities or sports. °¨ Resume sexual activity. °· Do not drive home if you are discharged the same day as the procedure. Have someone else drive you. °· You may drive 24 hours after the procedure unless otherwise  instructed by your health care provider. °· Do not operate machinery or power tools for 24 hours after the procedure. °· If your procedure was done as an outpatient procedure, which means that you went home the same day as your procedure, a responsible adult should be with you for the first 24 hours after you arrive home. °· Keep all follow-up visits as directed by your health care provider. This is important. °SEEK MEDICAL CARE IF: °· You have a fever. °· You have chills. °· You have increased bleeding from the radial site. Hold pressure on the site. °SEEK IMMEDIATE MEDICAL CARE IF: °· You have unusual pain at the radial site. °· You have redness, warmth, or swelling at the radial site. °· You have drainage (other than a small amount of blood on the dressing) from the radial site. °· The radial site is bleeding, and the bleeding does not stop after 30 minutes of holding steady pressure on the site. °· Your arm or hand becomes pale, cool, tingly, or numb. °  °This information is not intended to replace advice given to you by your health care provider. Make sure you discuss any questions you have with your health care provider. °  °Document Released: 06/11/2010 Document Revised: 05/30/2014 Document Reviewed: 11/25/2013 °Elsevier Interactive Patient Education ©2016 Elsevier Inc. ° °

## 2015-02-25 NOTE — Interval H&P Note (Signed)
History and Physical Interval Note:  02/25/2015 9:13 AM  [name]  has presented today for surgery, with the diagnosis of abnormal myoview  The various methods of treatment have been discussed with the patient and family. After consideration of risks, benefits and other options for treatment, the patient has consented to  Procedure(s): Left Heart Cath and Coronary Angiography (N/A) as a surgical intervention .  The patient's history has been reviewed, patient examined, no change in status, stable for surgery.  I have reviewed the patient's chart and labs.  Questions were answered to the patient's satisfaction.   Cath Lab Visit (complete for each Cath Lab visit)  Clinical Evaluation Leading to the Procedure:   ACS: No.  Non-ACS:    Anginal Classification: CCS IV  Anti-ischemic medical therapy: Minimal Therapy (1 class of medications)  Non-Invasive Test Results: Intermediate-risk stress test findings: cardiac mortality 1-3%/year  Prior CABG: No previous CABG        Collier [facility] 02/25/2015 9:14 AM

## 2015-02-25 NOTE — Research (Signed)
CAD LAD Informed Consent   Subject Name: Kimberly Stein  Subject met inclusion and exclusion criteria.  The informed consent form, study requirements and expectations were reviewed with the subject and questions and concerns were addressed prior to the signing of the consent form.  The subject verbalized understanding of the trail requirements.  The subject agreed to participate in the CAD LAD trial and signed the informed consent.  The informed consent was obtained prior to performance of any protocol-specific procedures for the subject.  A copy of the signed informed consent was given to the subject and a copy was placed in the subject's medical record.  [name] 02/25/2015, 07:15  

## 2015-02-26 ENCOUNTER — Telehealth: Payer: Self-pay | Admitting: *Deleted

## 2015-02-26 MED ORDER — ATORVASTATIN CALCIUM 20 MG PO TABS: 20.0000 mg | ORAL_TABLET | Freq: Every day | ORAL | Status: DC

## 2015-02-26 NOTE — Telephone Encounter (Signed)
-----   Message from Skeet Latch, MD sent at 02/22/2015  6:21 PM EDT ----- Cholesterol is very elevated.  Increasing atorvastatin to 80 mg daily was a good idea.

## 2015-02-26 NOTE — Telephone Encounter (Signed)
Spoke with pt, she has taken lipitor in the past and was unable to tolerate. She is going to try it again. She wants to try the lowest dose first to see if she can tolerate. Script for lipitor 20 mg called to the pharmacy. She will let us know if she is able to tolerate. She is also interested in talking to kristin in the lipid clinic if does not tolerate the lipitor. Will make dr Oval Linsey aware.

## 2015-02-27 NOTE — Telephone Encounter (Signed)
Sounds good. Thank you

## 2015-03-09 NOTE — Progress Notes (Signed)
Cardiology Office Note   Date:  02/13/15  ID:  Kimberly Stein, DOB [birth year], MRN [id number]  PCP:  Eulas Post, MD  Cardiologist:   Sharol Harness, MD   No chief complaint on file.     History of Present Illness: Kimberly Stein is a 59 y.o. female with hypertension, hyperlipidemia and tobacco abuse who presents for follow up after heart catheterization.  Kimberly Stein was seen on 9/23 after she had an abnormal stress test.  She underwent LHC on 10/5, which revealed no coronary artery disease.  She has been feeling well since that time he denies any chest pain, shortness of breath, orthopnea, PND, lower extremity edema. She is looking forward to having her carpal tunnel surgery on November 2. After that she has a shoulder and back surgery planned. She is looking forward to the surgeries, because she wants to start back with her exercise routine. In the past she really like yoga but is unable to do that due to her joint pain.  Kimberly Stein was started on atorvastatin at her last appointment. For the last 9 days she has been having significant myalgias. She stopped taking it yesterday. She still has some samples of rosuvastatin and thinks that she may have tolerated this better. She is willing to retry taking this medication. She also wonders whether she may be a candidate for PCS K-9 inhibitor.  Kimberly Stein has cut back on smoking to 1 ppd to 3/4 ppd.  She is slowly decreasing the number of cigarettes she smokes each day.   Past Medical History  Diagnosis Date  . Allergy     sinus infections  . Anemia   . Arthritis   . Cancer ([place])     skin  . GERD (gastroesophageal reflux disease)   . Heart murmur   . Meniere's disease   . Neuromuscular disorder (HCC)     fibromyalgia  . Thyroid disease     hypothyroid  . 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 Celiac disease     Past Surgical History  Procedure Laterality Date  . Total abdominal hysterectomy    .  Cholecystectomy    . Colonoscopy    . Upper gastrointestinal endoscopy    . Dilation and curettage of uterus    . Cervical disc surgery    . Mouth surgery  2016  . Cardiac catheterization N/A 02/25/2015    Procedure: Left Heart Cath and Coronary Angiography;  Surgeon: Peter M Martinique, MD;  Location: [facility];  Service: Cardiovascular;  Laterality: N/A;     Current Outpatient Prescriptions  Medication Sig Dispense Refill  . albuterol (PROVENTIL HFA;VENTOLIN HFA) 108 (90 BASE) MCG/ACT inhaler Inhale 2 puffs into the lungs 2 (two) times daily.    . chlorthalidone (HYGROTON) 25 MG tablet Take 1 tablet (25 mg total) by mouth daily. 30 tablet 11  . cyclobenzaprine (FLEXERIL) 10 MG tablet Take 10 mg by mouth at bedtime.     [name] estradiol (ESTRACE) 1 MG tablet TAKE 1 TABLET BY MOUTH EVERY DAY 90 tablet 2  . gabapentin (NEURONTIN) 300 MG capsule Take 600 mg by mouth every evening.     [name] levothyroxine (SYNTHROID, LEVOTHROID) 150 MCG tablet TAKE 1 TABLET BY MOUTH EVERY DAY BEFORE BREAKFAST 30 tablet 5  . losartan (COZAAR) 50 MG tablet TAKE 1 TABLET BY MOUTH EVERY DAY 90 tablet 2  . metoprolol tartrate (LOPRESSOR) 25 MG tablet Take 0.5 tablets (12.5 mg total) by mouth  2 (two) times daily. 30 tablet 11  . omeprazole (PRILOSEC) 20 MG capsule Take 40 mg by mouth daily.    . Oxycodone HCl 10 MG TABS Take 10 mg by mouth at bedtime.   0  . potassium chloride (K-DUR,KLOR-CON) 10 MEQ tablet Take 10 mEq by mouth daily.     . traMADol (ULTRAM) 50 MG tablet TAKE 2 TABLETS EVERY 6 HOURS AS NEEDED FOR PAIN MAX OF 8 PER DAY 240 tablet 5  . VOLTAREN 1 % GEL Apply 2 g topically 3 (three) times daily. Place on area 3 times daily. Per Neurologist  3   No current facility-administered medications for this visit.    Allergies:   Vicodin    Social History:  The patient  reports that she has been smoking Cigarettes.  She has been smoking about 1.00 pack per day. She has never used smokeless tobacco. She reports  that she does not drink alcohol or use illicit drugs.   Family History:  The patient's family history includes Celiac disease in her mother; Diabetes in her mother; Heart disease in her mother; Prostate cancer in her father. There is no history of Colon cancer, Esophageal cancer, or Stomach cancer.    ROS:  Please see the history of present illness.   Otherwise, review of systems are positive for none.   All other systems are reviewed and negative.    PHYSICAL EXAM: VS:  BP 130/62 mmHg  Pulse 41  Ht 5' 3"  (1.6 m)  Wt 67.314 kg (148 lb 6.4 oz)  BMI 26.29 kg/m2 , BMI Body mass index is 26.29 kg/(m^2). GENERAL:  Well appearing HEENT:  Pupils equal round and reactive, fundi not visualized, oral mucosa unremarkable NECK:  No jugular venous distention, waveform within normal limits, carotid upstroke brisk and symmetric, no bruits, no thyromegaly LYMPHATICS:  No cervical adenopathy LUNGS:  Clear to auscultation bilaterally HEART:  RRR.  PMI not displaced or sustained,S1 and S2 within normal limits, no S3, no S4, no clicks, no rubs, no murmurs ABD:  Flat, positive bowel sounds normal in frequency in pitch, no bruits, no rebound, no guarding, no midline pulsatile mass, no hepatomegaly, no splenomegaly EXT:  2 plus pulses throughout, no edema, no cyanosis no clubbing SKIN:  No rashes no nodules NEURO:  Cranial nerves II through XII grossly intact, motor grossly intact throughout PSYCH:  Cognitively intact, oriented to person place and time    EKG:  EKG is not ordered today.  LHC 02/25/15: Dominance: Right   Left Main  The vessel was injected is normal in caliber is angiographically normal.     Left Anterior Descending  The vessel was injected is normal in caliber . The vessel is tortuous. Minimal calcification in the proximal vessel.     Left Circumflex  The vessel was injected is normal in caliber is angiographically normal.     Right Coronary Artery  The vessel was injected is  normal in caliber is angiographically normal. Minimal calcification in the proximal vessel.       Recent Labs: 08/06/2014: TSH 1.77 02/19/2015: ALT 14; BUN 14; Creat 0.72; Hemoglobin 14.1; Platelets 214; Potassium 4.4; Sodium 138    Lipid Panel    Component Value Date/Time   CHOL 303* 02/19/2015 0755   TRIG 742* 02/19/2015 0755   HDL 39* 02/19/2015 0755   CHOLHDL 7.8* 02/19/2015 0755   VLDL NOT CALC 02/19/2015 0755   LDLCALC NOT CALC 02/19/2015 0755   LDLDIRECT 168* 02/19/2015 0755  Wt Readings from Last 3 Encounters:  03/10/15 67.314 kg (148 lb 6.4 oz)  02/25/15 67.132 kg (148 lb)  02/13/15 67.178 kg (148 lb 1.6 oz)      Other studies Reviewed: Additional studies/ records that were reviewed today include: . Review of the above records demonstrates:  Please see elsewhere in the note.     ASSESSMENT AND PLAN:  # Surgical risk assessment: The patient does not have any unstable cardiac conditions.  Upon evaluation today, she can achieve 4 METs or greater without anginal symptoms.  She had a heart cath that revealed no coronary artery disease.  According to [facility] and AHA guidelines, she requires no further cardiac workup prior to her noncardiac surgery and should be at acceptable risk.  her NSQIP risk of peri-procedural MI or cardiac arrest is 0.14%.  Our service is available as necessary in the perioperative period.  She was provided with a cardiac clearance letter.  #Chest pain: Symptoms were likely due to GERD or her hiatal hernia. She has not had any recurrent symptoms. Recommend that she continue her PPI. She has no coronary artery disease seen on heart catheterization. We will stop her metoprolol because her blood pressure is well controlled and she is bradycardic.  # Hypertension: BP well-controlled.  We will stop metoprolol and resume her prior dose of chlorthalidone and losartan as above.   # Tobacco abuse: Ms. Beers was congratulated on her efforts to stop smoking.  She continues to cut back by a few cigarettes each week.  # Hyperlipidemia: Ms. Demo did not tolerate atorvastatin due to myalgias. She is willing to retry rosuvastatin. We will have her stop her atorvastatin for the rest of the week and start rosuvastatin 5 mg every other day starting this weekend.  If she tolerates that for one week then she will increase to 5 mg daily. She will follow-up with Tommy Medal, PharmD in one month.  If she does not tolerate the medication or if her lipids are not at goal, she will be considered for PCSK9 therapy.  Current medicines are reviewed at length with the patient today.  The patient does not have concerns regarding medicines.  The following changes have been made:  Stop metoprolol and atorvastatin.  Start rosuvastatin. Labs/ tests ordered today include:   No orders of the defined types were placed in this encounter.     Disposition:   FU with Pamelyn Bancroft C. Oval Linsey, MD in 1 year.   Signed, Sharol Harness, MD  03/10/2015 10:59 AM    [facility]

## 2015-03-10 ENCOUNTER — Encounter: Payer: Self-pay | Admitting: Cardiovascular Disease

## 2015-03-10 ENCOUNTER — Ambulatory Visit (INDEPENDENT_AMBULATORY_CARE_PROVIDER_SITE_OTHER): Admitting: Cardiovascular Disease

## 2015-03-10 VITALS — BP 130/62 | HR 41 | Ht 63.0 in | Wt 148.4 lb

## 2015-03-10 DIAGNOSIS — E785 Hyperlipidemia, unspecified: Secondary | ICD-10-CM | POA: Diagnosis not present

## 2015-03-10 DIAGNOSIS — Z01818 Encounter for other preprocedural examination: Secondary | ICD-10-CM | POA: Diagnosis not present

## 2015-03-10 DIAGNOSIS — I10 Essential (primary) hypertension: Secondary | ICD-10-CM

## 2015-03-10 MED ORDER — ROSUVASTATIN CALCIUM 5 MG PO TABS: 5.0000 mg | ORAL_TABLET | Freq: Every day | ORAL | Status: DC

## 2015-03-10 NOTE — Patient Instructions (Addendum)
Medication Instructions:  STOP Atorvastatin STOP Metoprolol Tartrate START Crestor 5 mg. Take 1 tablet every other day for 1 week. If you tolerate this well, take 1 tablet daily thereafter. A new prescription has been sent to your pharmacy.  Labwork: NONE  Testing/Procedures: NONE  Follow-Up: Your physician recommends that you schedule a follow-up appointment in 1 month with our pharmacist, Tommy Medal, PharmD.  Dr Oval Linsey recommends that you schedule a follow-up appointment in 1 year. You will receive a reminder letter in the mail two months in advance. If you don't receive a letter, please call our office to schedule the follow-up appointment.  If you need a refill on your cardiac medications before your next appointment, please call your pharmacy.

## 2015-03-25 ENCOUNTER — Other Ambulatory Visit: Payer: Self-pay | Admitting: Orthopedic Surgery

## 2015-03-29 ENCOUNTER — Other Ambulatory Visit: Payer: Self-pay | Admitting: Family Medicine

## 2015-03-30 NOTE — Telephone Encounter (Signed)
Refill with 3 additional refills.  I would like for her to try to decrease to TID if possible.

## 2015-04-09 ENCOUNTER — Ambulatory Visit: Admitting: Pharmacist Clinician (PhC)/ Clinical Pharmacy Specialist

## 2015-04-14 ENCOUNTER — Other Ambulatory Visit: Payer: Self-pay | Admitting: Family Medicine

## 2015-04-22 ENCOUNTER — Other Ambulatory Visit: Payer: Self-pay | Admitting: Family Medicine

## 2015-04-22 MED ORDER — LOSARTAN POTASSIUM 50 MG PO TABS: 50.0000 mg | ORAL_TABLET | Freq: Every day | ORAL | Status: DC

## 2015-04-22 MED ORDER — POTASSIUM CHLORIDE CRYS ER 10 MEQ PO TBCR: 10.0000 meq | EXTENDED_RELEASE_TABLET | Freq: Every day | ORAL | Status: DC

## 2015-04-22 MED ORDER — ESTRADIOL 1 MG PO TABS: 1.0000 mg | ORAL_TABLET | Freq: Every day | ORAL | Status: DC

## 2015-04-23 ENCOUNTER — Other Ambulatory Visit: Payer: Self-pay | Admitting: *Deleted

## 2015-04-23 MED ORDER — LOSARTAN POTASSIUM 50 MG PO TABS: 50.0000 mg | ORAL_TABLET | Freq: Every day | ORAL | Status: DC

## 2015-04-23 NOTE — Telephone Encounter (Signed)
A refill request came from [facility] and the patient has [facility].  I spoke with the patient and her insurance now requires her to get her prescriptions from [facility].  Rx sent.

## 2015-05-11 ENCOUNTER — Ambulatory Visit (INDEPENDENT_AMBULATORY_CARE_PROVIDER_SITE_OTHER): Admitting: Pharmacist Clinician (PhC)/ Clinical Pharmacy Specialist

## 2015-05-11 VITALS — Ht 63.0 in | Wt 147.7 lb

## 2015-05-11 DIAGNOSIS — E785 Hyperlipidemia, unspecified: Secondary | ICD-10-CM

## 2015-05-11 MED ORDER — FENOFIBRATE 145 MG PO TABS: ORAL_TABLET | ORAL | Status: DC

## 2015-05-11 NOTE — Patient Instructions (Signed)
Start fenofibrate tablets once daily with food.    Repeat labs at the first of March and see me about 1 week after that    Food Choices to Lower Your Triglycerides Triglycerides are a type of fat in your blood. High levels of triglycerides can increase the risk of heart disease and stroke. If your triglyceride levels are high, the foods you eat and your eating habits are very important. Choosing the right foods can help lower your triglycerides.  WHAT GENERAL GUIDELINES DO I NEED TO FOLLOW?  Lose weight if you are overweight.   Limit or avoid alcohol.   Fill one half of your plate with vegetables and green salads.   Limit fruit to two servings a day. Choose fruit instead of juice.   Make one fourth of your plate whole grains. Look for the word "whole" as the first word in the ingredient list.  Fill one fourth of your plate with lean protein foods.  Enjoy fatty fish (such as salmon, mackerel, sardines, and tuna) three times a week.   Choose healthy fats.   Limit foods high in starch and sugar.  Eat more home-cooked food and less restaurant, buffet, and fast food.  Limit fried foods.  Cook foods using methods other than frying.  Limit saturated fats.  Check ingredient lists to avoid foods with partially hydrogenated oils (trans fats) in them. WHAT FOODS CAN I EAT?  Grains Whole grains, such as whole wheat or whole grain breads, crackers, cereals, and pasta. Unsweetened oatmeal, bulgur, barley, quinoa, or brown rice. Corn or whole wheat flour tortillas.  Vegetables Fresh or frozen vegetables (raw, steamed, roasted, or grilled). Green salads. Fruits All fresh, canned (in natural juice), or frozen fruits. Meat and Other Protein Products Ground beef (85% or leaner), grass-fed beef, or beef trimmed of fat. Skinless chicken or [place]. Ground chicken or [place]. Pork trimmed of fat. All fish and seafood. Eggs. Dried beans, peas, or lentils. Unsalted nuts or seeds. Unsalted  canned or dry beans. Dairy Low-fat dairy products, such as skim or 1% milk, 2% or reduced-fat cheeses, low-fat ricotta or cottage cheese, or plain low-fat yogurt. Fats and Oils Tub margarines without trans fats. Light or reduced-fat mayonnaise and salad dressings. Avocado. Safflower, olive, or canola oils. Natural peanut or almond butter. The items listed above may not be a complete list of recommended foods or beverages. Contact your dietitian for more options. WHAT FOODS ARE NOT RECOMMENDED?  Grains White bread. White pasta. White rice. Cornbread. Bagels, pastries, and croissants. Crackers that contain trans fat. Vegetables White potatoes. Corn. Creamed or fried vegetables. Vegetables in a cheese sauce. Fruits Dried fruits. Canned fruit in light or heavy syrup. Fruit juice. Meat and Other Protein Products Fatty cuts of meat. Ribs, chicken wings, bacon, sausage, bologna, salami, chitterlings, fatback, hot dogs, bratwurst, and packaged luncheon meats. Dairy Whole or 2% milk, cream, half-and-half, and cream cheese. Whole-fat or sweetened yogurt. Full-fat cheeses. Nondairy creamers and whipped toppings. Processed cheese, cheese spreads, or cheese curds. Sweets and Desserts Corn syrup, sugars, honey, and molasses. Candy. Jam and jelly. Syrup. Sweetened cereals. Cookies, pies, cakes, donuts, muffins, and ice cream. Fats and Oils Butter, stick margarine, lard, shortening, ghee, or bacon fat. Coconut, palm kernel, or palm oils. Beverages Alcohol. Sweetened drinks (such as sodas, lemonade, and fruit drinks or punches). The items listed above may not be a complete list of foods and beverages to avoid. Contact your dietitian for more information.   This information is not intended to  replace advice given to you by your health care provider. Make sure you discuss any questions you have with your health care provider.   Document Released: 02/25/2004 Document Revised: 05/30/2014 Document Reviewed:  03/13/2013 Elsevier Interactive Patient Education [employer].

## 2015-05-12 ENCOUNTER — Encounter: Payer: Self-pay | Admitting: Pharmacist Clinician (PhC)/ Clinical Pharmacy Specialist

## 2015-05-12 NOTE — Progress Notes (Signed)
05/12/2015 Kimberly Stein Jul 29, 1955 [id number]   HPI:  Kimberly Stein is a 59 y.o. female patient of Dr Oval Linsey, who presents today for a lipid clinic evaluation.  She suffers from fibromyalgia, celiac disease and Meniere's disease.  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  She also recently underwent carpal tunnel surgery on her right hand.  RF:  Hypertension, hyperlipidemia, tobacco abuse, no ASCVD   Meds: fish oil (OTC) 1 gm qd   Tried in the past: atorvastatin 20 mg caused myalgias, she believes she was able to better tolerate rosuvastatin 5 mg  Family history: mother had MI  Social history:  Smokes 1 ppd, knows she should quit, but hasn't put much effort into that; no alcohol  Diet:  Patient has celiac disease, she avoids gluten containing foods, but otherwise "eats whatever I want"  Admits to bad diet.  Doesn't eat any breakfast or lunch, first meal is around 5pm - hamburgers (without bun), sauteed vegetables, salad, baked potatoes, lots of cheese and butter, chips, candy.    Labs:  08-2013   TC 182, TG 157, HDL 61.7, LDL 89  (on Crestor 5 mg daily) 01-2015   TC 303, TG 742, HDL 39, LDL 168  (fish oil 1 gm)   Current Outpatient Prescriptions  Medication Sig Dispense Refill  . albuterol (PROVENTIL HFA;VENTOLIN HFA) 108 (90 BASE) MCG/ACT inhaler Inhale 2 puffs into the lungs 2 (two) times daily.    . chlorthalidone (HYGROTON) 25 MG tablet Take 1 tablet (25 mg total) by mouth daily. 30 tablet 11  . cyclobenzaprine (FLEXERIL) 10 MG tablet Take 10 mg by mouth at bedtime.     [name] estradiol (ESTRACE) 1 MG tablet Take 1 tablet (1 mg total) by mouth daily. 90 tablet 2  . fenofibrate (TRICOR) 145 MG tablet Take 1 tablet by mouth daily with food 30 tablet 5  . gabapentin (NEURONTIN) 300 MG capsule Take 600 mg by mouth every evening.     [name] levothyroxine (SYNTHROID, LEVOTHROID) 150 MCG tablet TAKE 1 TABLET BY MOUTH  EVERY DAY BEFORE BREAKFAST 30 tablet 5  . losartan (COZAAR) 50 MG tablet Take 1 tablet (50 mg total) by mouth daily. 90 tablet 2  . omeprazole (PRILOSEC) 20 MG capsule Take 40 mg by mouth daily.    . Oxycodone HCl 10 MG TABS Take 10 mg by mouth at bedtime.   0  . potassium chloride (K-DUR,KLOR-CON) 10 MEQ tablet Take 1 tablet (10 mEq total) by mouth daily. 90 tablet 1  . PROAIR HFA 108 (90 BASE) MCG/ACT inhaler INHALE 2 PUFFS BY MOUTH EVERY 4-6 HOURS AS NEEDED 8.5 Inhaler 3  . rosuvastatin (CRESTOR) 5 MG tablet Take 1 tablet (5 mg total) by mouth daily. 90 tablet 3  . traMADol (ULTRAM) 50 MG tablet TAKE 2 TABLETS BY MOUTH EVERY 6 HOURS AS NEEDED FOR PAIN MAX 8 PER DAY 240 tablet 1  . VOLTAREN 1 % GEL Apply 2 g topically 3 (three) times daily. Place on area 3 times daily. Per Neurologist  3   No current facility-administered medications for this visit.    Allergies  Allergen Reactions  . Vicodin [Hydrocodone-Acetaminophen] Itching    Past Medical History  Diagnosis Date  . Allergy     sinus infections  . Anemia   . Arthritis   . Cancer ([place])     skin  . GERD (gastroesophageal reflux disease)   .  Heart murmur   . Meniere's disease   . Neuromuscular disorder (HCC)     fibromyalgia  . Thyroid disease     hypothyroid  . 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 Celiac disease     Height 5' 3"  (1.6 m), weight 147 lb 11.2 oz (66.996 kg).   ASSESSMENT AND PLAN:  Kimberly Stein PharmD CPP [facility]

## 2015-05-12 NOTE — Assessment & Plan Note (Signed)
Had a long discussion with the patient about the change in her cholesterol readings over the past 18 months, especially the triglycerides.  She is not diabetic and has been on estradiol 1 mg since her hysterectomy over 15 years ago.  As she does not eat breakfast or lunch, she is sure the labs were drawn while fasting.  I am going to start her on fenofibrate 145 mg once daily, with her evening meal.  I also asked her to increase her fish oil to 2 gm bid.  She was given written information on dietary changes to help decrease the triglycerides as well.  These were reviewed in the office as well.  She stated that she still has some Crestor at home and would be willing to try that again.  I suggested that she start out with taking only 5 mg once weekly.  If after 4 doses she still feels fine, then increase to twice weekly.  I will have her repeat labs in early March, will do a Cardio IQ, then see her afterward to review.  We also briefly discusses her smoking habit and I gave her some suggestions for how to cut back on the number of cigarettes she smokes daily, to hopefully help her quit.

## 2015-05-13 ENCOUNTER — Other Ambulatory Visit: Payer: Self-pay | Admitting: *Deleted

## 2015-05-13 MED ORDER — POTASSIUM CHLORIDE CRYS ER 10 MEQ PO TBCR: 10.0000 meq | EXTENDED_RELEASE_TABLET | Freq: Every day | ORAL | Status: DC

## 2015-05-13 NOTE — Telephone Encounter (Signed)
Rx done. 

## 2015-06-03 ENCOUNTER — Other Ambulatory Visit: Payer: Self-pay | Admitting: *Deleted

## 2015-06-03 MED ORDER — TRAMADOL HCL 50 MG PO TABS: ORAL_TABLET | ORAL | Status: DC

## 2015-06-03 NOTE — Telephone Encounter (Signed)
Refill with one additional refill.

## 2015-06-07 HISTORY — PX: ROTATOR CUFF REPAIR: SHX139

## 2015-06-29 ENCOUNTER — Other Ambulatory Visit: Payer: Self-pay

## 2015-06-29 DIAGNOSIS — Z1231 Encounter for screening mammogram for malignant neoplasm of breast: Secondary | ICD-10-CM

## 2015-07-16 ENCOUNTER — Ambulatory Visit

## 2015-07-27 ENCOUNTER — Other Ambulatory Visit: Payer: Self-pay | Admitting: *Deleted

## 2015-07-27 ENCOUNTER — Ambulatory Visit: Attending: Orthopaedic Surgery

## 2015-07-27 DIAGNOSIS — M25512 Pain in left shoulder: Secondary | ICD-10-CM | POA: Diagnosis not present

## 2015-07-27 DIAGNOSIS — M25612 Stiffness of left shoulder, not elsewhere classified: Secondary | ICD-10-CM | POA: Diagnosis present

## 2015-07-27 DIAGNOSIS — R29898 Other symptoms and signs involving the musculoskeletal system: Secondary | ICD-10-CM | POA: Diagnosis present

## 2015-07-27 MED ORDER — ALBUTEROL SULFATE HFA 108 (90 BASE) MCG/ACT IN AERS: 2.0000 | INHALATION_SPRAY | Freq: Two times a day (BID) | RESPIRATORY_TRACT | Status: DC

## 2015-07-27 NOTE — Patient Instructions (Signed)
ROM: Pendulum (Circular)  Let right arm move in circle clockwise, then counterclockwise, by rocking body weight in circular pattern. Circle _10___ times each direction per set. Do _3___ sessions per day.  Pendulum Side to Side  Bend forward 90 at waist, leaning on table for support. Rock body from side to side and let arm swing freely. Repeat _10___ times. Do __3__ sessions per day.  Finger Flexors  Keeping right fingertips straight, press putty toward base of palm. Repeat __20__ times. Do __3__ sessions per day. Activity: Squeeze flour sifter, plastic squeeze bottles, [place] baster, juice from fruit.*  AROM: Elbow Flexion / Extension  With left hand palm up, gently bend elbow as far as possible. Then straighten arm as far as possible. Repeat __10__ times per set. Do __3__ sessions per day.  SHOULDER: Flexion On Table  Place hands on table, elbows straight. Move hips away from body. Press hands down into table. Hold _3__ seconds. _10__ reps per set, __3_ sets per day.  Elevation: Shrug (Distal Resist)  Also add circles forward and back   Lift shoulders straight up, then return. Maintain same speed up and down. Avoid moving head and neck forward. Repeat _10___ times per set. Do __1-2__ sets per session. Do many times daily. Copyright  VHI. All rights reserved.   Flexion (Assistive)    Clasp hands together and raise arms above head, keeping elbows as straight as possible. Can be done sitting or lying.  Hold 10 seconds. Repeat _5-10___ times. Do _3___ sessions per day.  Copyright  VHI. All rights reserved.   [facility] [address], [place], [place] [address] Phone # [phone] Fax [phone]

## 2015-07-27 NOTE — Therapy (Signed)
[facility] [address] [place], [place], [address] Phone: [phone]   Fax:  [phone]  Physical Therapy Evaluation  Patient Details  Name: Kimberly Stein MRN: [id number] Date of Birth: 08-Feb-1956 Referring Provider: Joni Fears, MD  Encounter Date: 07/27/2015      PT End of Session - 07/27/15 1127    Visit Number 1   Number of Visits 10  [employer]   Date for PT Re-Evaluation 09/21/15   PT Start Time 1100   PT Stop Time 1136   PT Time Calculation (min) 36 min   Activity Tolerance Patient tolerated treatment well   Behavior During Therapy [facility] for tasks assessed/performed      Past Medical History  Diagnosis Date  . Allergy     sinus infections  . Anemia   . Arthritis   . Cancer ([place])     skin  . GERD (gastroesophageal reflux disease)   . Heart murmur   . Meniere's disease   . Neuromuscular disorder (HCC)     fibromyalgia  . Thyroid disease     hypothyroid  . 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 Celiac disease     Past Surgical History  Procedure Laterality Date  . Total abdominal hysterectomy    . Cholecystectomy    . Colonoscopy    . Upper gastrointestinal endoscopy    . Dilation and curettage of uterus    . Cervical disc surgery    . Mouth surgery  2016  . Cardiac catheterization N/A 02/25/2015    Procedure: Left Heart Cath and Coronary Angiography;  Surgeon: Peter M Martinique, MD;  Location: [place] [facility];  Service: Cardiovascular;  Laterality: N/A;    There were no vitals filed for this visit.  Visit Diagnosis:  Pain in joint of left shoulder - Plan: PT plan of care cert/re-cert  Shoulder stiffness, left - Plan: PT plan of care cert/re-cert  Weakness of shoulder - Plan: PT plan of care cert/re-cert      Subjective Assessment - 07/27/15 1103    Subjective Pt is Rt hand dominant female who presents to PT s/p Lt shoulder arthroscopy for debridment, SAD and DCR. Pt reports  a minor tear in the rotator cuff was repaired during surgery.  Pt wore a sling for a few days and is now without sling.     Pertinent History Lt shoulder arthroscopty with subacromial decomression and distal clavicle resection 07/09/15, Celiac disease   Patient Stated Goals use Lt arm for ADLs and cleaning, reduce pain, improve AROM and strength   Currently in Pain? Yes   Pain Score 2    Pain Location Shoulder   Pain Orientation Left   Pain Descriptors / Indicators Aching;Sore   Pain Type Surgical pain   Pain Onset 1 to 4 weeks ago   Pain Frequency Constant   Aggravating Factors  reaching overhead or out out to the side with Lt UE, movement   Pain Relieving Factors rest, medication            [facility] PT Assessment - 07/27/15 0001    Assessment   Medical Diagnosis Lt shoulder arthoscopy, SAD, DCR   Referring Provider Joni Fears, MD   Onset Date/Surgical Date 07/09/15   Hand Dominance Right   Next MD Visit 08/10/15   Prior Therapy none   Precautions   Precautions Other (comment)  gentle AROM/strength progress, history of skin cancer- no [place]   Restrictions   Weight Bearing Restrictions  No   Balance Screen   Has the patient fallen in the past 6 months No   Has the patient had a decrease in activity level because of a fear of falling?  No   Is the patient reluctant to leave their home because of a fear of falling?  No   Home Environment   Living Environment Private residence   Type of Home House   Prior Function   Level of Independence Independent   Vocation Unemployed   Vocation Requirements pt cares for her home including cooking and cleaning   Cognition   Overall Cognitive Status Within Functional Limits for tasks assessed   Observation/Other Assessments   Focus on Therapeutic Outcomes (FOTO)  41% limitation   Posture/Postural Control   Posture/Postural Control Postural limitations   Postural Limitations Rounded Shoulders;Forward head   ROM / Strength   AROM / PROM /  Strength AROM;PROM;Strength   AROM   Overall AROM  Deficits   Overall AROM Comments Rt shoulder AROM is full without pain   AROM Assessment Site Shoulder   Right/Left Shoulder Left   Left Shoulder Flexion 110 Degrees   Left Shoulder ABduction 74 Degrees   Left Shoulder Internal Rotation --  to Lt buttock   Left Shoulder External Rotation --  lacking 2.5 inches vs the Rt   PROM   Overall PROM  Deficits   PROM Assessment Site Shoulder   Right/Left Shoulder Left   Left Shoulder Flexion 124 Degrees   Left Shoulder ABduction 86 Degrees   Left Shoulder Internal Rotation 63 Degrees   Left Shoulder External Rotation 57 Degrees   Strength   Overall Strength Deficits   Overall Strength Comments Rt shoulder 5/5.  Rt shoulder tested in neutral with submax testing 4-/5 throughout   Palpation   Palpation comment Pt with palpalpable tenderness over Lt glenohumeral joint, posterior capsule,  and latissimus on Lt.  Reduced joint mobility with inferior and anterior/posterior glides                           PT Education - 07/27/15 1127    Education provided Yes   Education Details HEP: sling exercises, AAROM   Person(s) Educated Patient   Methods Explanation;Demonstration;Handout   Comprehension Verbalized understanding;Returned demonstration          PT Short Term Goals - 07/27/15 1136    PT SHORT TERM GOAL #1   Title be independent in initial HEP   Time 4   Period Weeks   Status New   PT SHORT TERM GOAL #2   Title demonstrate Lt shoulder AROM flexion to > or = to 125 degrees to improve overhead reaching   Time 4   Period Weeks   Status New   PT SHORT TERM GOAL #3   Title perform self-care overhead and behind the back with 25% increased ease with Lt UE   Time 4   Period Weeks   Status New   PT SHORT TERM GOAL #4   Title demonstrate Lt shoulder IR to L3 to improve reaching behind the back   Time 4   Period Weeks   Status New           PT Long Term  Goals - 07/27/15 1046    PT LONG TERM GOAL #1   Title be independent in advanced HEP   Time 8   Period Weeks   Status New   PT LONG TERM GOAL #2  Title reduce FOTO to < or = to 33% limitation   Time 8   Period Weeks   Status New   PT LONG TERM GOAL #3   Title demonstrate Lt shoulder strength to > or = to 4+/5 throughout to improve endurance with ADLs and self-care tasks   Time 8   Period Weeks   Status New   PT LONG TERM GOAL #4   Title report a 60% reduction in Lt shoulder pain with use   Time 8   Period Weeks   Status New   PT LONG TERM GOAL #5   Title demonstrate Lt shoulder AROM flexion to > or = to 145 degrees to imrpove overhead use   Time 8   Period Weeks   Status New               Plan - 08-24-15 1131    Clinical Impression Statement Pt is a Rt hand dominant female who presents to PT ~3 weeks s/p Lt shoulder arthroscopy incuding subacromial decompression and distal clavicle resection.  Pt has weaned from the sling.  Pt demonstrates reduced Lt shoulder AROM, strength and use with housework and overhead tasks.  FOTO score is 41% limitation.  Pt will benefit from skilled PT for safe progression of Lt shoulder strength, AROM and for pain management as needed to allow for return to normal use of the Lt UE.     Pt will benefit from skilled therapeutic intervention in order to improve on the following deficits Postural dysfunction;Decreased strength;Impaired flexibility;Pain;Decreased activity tolerance;Decreased endurance;Decreased range of motion   Rehab Potential Good   PT Frequency 2x / week   PT Duration 8 weeks   PT Treatment/Interventions ADLs/Self Care Home Management;Electrical Stimulation;Moist Heat;Iontophoresis 80m/ml Dexamethasone;Cryotherapy;Functional mobility training;Therapeutic activities;Therapeutic exercise;Manual techniques;Patient/family education;Neuromuscular re-education;Passive range of motion;Dry needling;Taping   PT Next Visit Plan Gentle AROM  and strength progression of the Lt UE.  Try pulleys and discuss possible use at home.  Pain management as needed.     Consulted and Agree with Plan of Care Patient          G-Codes - [id number]    Functional Assessment Tool Used FOTPO: 41% limitation   Functional Limitation Other PT primary   Other PT Primary Current Status ((H4193 At least 40 percent but less than 60 percent impaired, limited or restricted   Other PT Primary Goal Status (([id number] At least 20 percent but less than 40 percent impaired, limited or restricted       Problem List Patient Active Problem List   Diagnosis Date Noted  . Chest pain 02/25/2015  . Abnormal nuclear stress test 02/25/2015  . Hyperlipidemia 07/17/2013  . Osteopenia 01/28/2012  . 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 CELIAC SPRUE 09/29/2009  . Hypothyroidism 09/07/2009  . MENIERE'S DISEASE 09/07/2009  . Essential hypertension 09/07/2009  . RUQ PAIN 09/07/2009  . FIBROMYALGIA 06/12/2009  . CERVICALGIA 06/03/2009  . ANEMIA-IRON DEFICIENCY 10/21/2008  . GERD 10/21/2008  . DIZZINESS 10/21/2008  . COLONIC POLYPS, HX OF 10/21/2008    Alegandro Macnaughton, PT 304-07-2015 11:41 AM  [facility] [address] [place] [address]Phone: [phone]  Fax:  [phone] Name: Kimberly CLITESMRN: [id number] of Birth: 7May 26, 1957

## 2015-07-30 ENCOUNTER — Ambulatory Visit

## 2015-07-30 DIAGNOSIS — M25512 Pain in left shoulder: Secondary | ICD-10-CM | POA: Diagnosis not present

## 2015-07-30 DIAGNOSIS — R29898 Other symptoms and signs involving the musculoskeletal system: Secondary | ICD-10-CM

## 2015-07-30 DIAGNOSIS — M25612 Stiffness of left shoulder, not elsewhere classified: Secondary | ICD-10-CM

## 2015-07-30 NOTE — Therapy (Signed)
[facility] [address], [place], [place], [address] Phone: [phone]   Fax:  [phone]  Physical Therapy Treatment  Patient Details  Name: Kimberly Stein MRN: [id number] Date of Birth: May 06, 1956 Referring Provider: Joni Fears, MD  Encounter Date: 07/30/2015      PT End of Session - 07/30/15 0917    Visit Number 2   Number of Visits 10  [employer]   Date for PT Re-Evaluation 09/21/15   PT Start Time 0840   PT Stop Time 0932   PT Time Calculation (min) 52 min   Activity Tolerance Patient tolerated treatment well   Behavior During Therapy [facility] for tasks assessed/performed      Past Medical History  Diagnosis Date  . Allergy     sinus infections  . Anemia   . Arthritis   . Cancer ([place])     skin  . GERD (gastroesophageal reflux disease)   . Heart murmur   . Meniere's disease   . Neuromuscular disorder (HCC)     fibromyalgia  . Thyroid disease     hypothyroid  . 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 Celiac disease     Past Surgical History  Procedure Laterality Date  . Total abdominal hysterectomy    . Cholecystectomy    . Colonoscopy    . Upper gastrointestinal endoscopy    . Dilation and curettage of uterus    . Cervical disc surgery    . Mouth surgery  2016  . Cardiac catheterization N/A 02/25/2015    Procedure: Left Heart Cath and Coronary Angiography;  Surgeon: Peter M Martinique, MD;  Location: [place] [facility];  Service: Cardiovascular;  Laterality: N/A;    There were no vitals filed for this visit.  Visit Diagnosis:  Pain in joint of left shoulder  Shoulder stiffness, left  Weakness of shoulder      Subjective Assessment - 07/30/15 0844    Subjective Pt has been doing exercises at home.     Pertinent History Lt shoulder arthroscopty with subacromial decomression and distal clavicle resection 07/09/15, Celiac disease   Patient Stated Goals use Lt arm for ADLs and cleaning,  reduce pain, improve AROM and strength   Currently in Pain? Yes   Pain Score 5    Pain Location Shoulder   Pain Orientation Left   Pain Descriptors / Indicators Aching;Sore   Pain Type Surgical pain   Pain Frequency Constant   Aggravating Factors  reaching overhead or out to the side with lt UE, movement   Pain Relieving Factors pain meds, rest,                          [facility] Adult PT Treatment/Exercise - 07/30/15 0001    Exercises   Exercises Shoulder;Neck   Neck Exercises: Seated   Lateral Flexion Both;5 reps   Shoulder Shrugs 20 reps   Shoulder Rolls 20 reps   Shoulder Exercises: Supine   Flexion AAROM;Both;20 reps  with cane   Other Supine Exercises chest press: 2x10   Shoulder Exercises: Seated   Other Seated Exercises elbow flexion/extension 2x10   Other Seated Exercises digiflex: red x 2 minutes   Shoulder Exercises: Pulleys   Flexion 3 minutes  2x3 minutes   Shoulder Exercises: Stretch   Table Stretch - Flexion 5 reps;10 seconds   Modalities   Modalities Cryotherapy   Cryotherapy   Number Minutes Cryotherapy 10 Minutes   Cryotherapy  Location Shoulder  Left   Type of Cryotherapy Ice pack   Manual Therapy   Manual Therapy Joint mobilization;Soft tissue mobilization;Passive ROM   Joint Mobilization Lt glenohumeral joint: inferior glide and posterior glide   Soft tissue mobilization posterior shoulder capsule   Passive ROM Lt shoulder into flexion, IR, ER to pt tolerance                PT Education - 07/30/15 0853    Education provided Yes   Education Details neck UT stretch   Person(s) Educated Patient   Methods Explanation;Demonstration;Handout   Comprehension Verbalized understanding;Returned demonstration          PT Short Term Goals - 07/27/15 1136    PT SHORT TERM GOAL #1   Title be independent in initial HEP   Time 4   Period Weeks   Status New   PT SHORT TERM GOAL #2   Title demonstrate Lt shoulder AROM flexion to >  or = to 125 degrees to improve overhead reaching   Time 4   Period Weeks   Status New   PT SHORT TERM GOAL #3   Title perform self-care overhead and behind the back with 25% increased ease with Lt UE   Time 4   Period Weeks   Status New   PT SHORT TERM GOAL #4   Title demonstrate Lt shoulder IR to L3 to improve reaching behind the back   Time 4   Period Weeks   Status New           PT Long Term Goals - 07/27/15 1046    PT LONG TERM GOAL #1   Title be independent in advanced HEP   Time 8   Period Weeks   Status New   PT LONG TERM GOAL #2   Title reduce FOTO to < or = to 33% limitation   Time 8   Period Weeks   Status New   PT LONG TERM GOAL #3   Title demonstrate Lt shoulder strength to > or = to 4+/5 throughout to improve endurance with ADLs and self-care tasks   Time 8   Period Weeks   Status New   PT LONG TERM GOAL #4   Title report a 60% reduction in Lt shoulder pain with use   Time 8   Period Weeks   Status New   PT LONG TERM GOAL #5   Title demonstrate Lt shoulder AROM flexion to > or = to 145 degrees to imrpove overhead use   Time 8   Period Weeks   Status New               Plan - 07/30/15 0845    Clinical Impression Statement Pt with only 1 session after evaluation.  Pt is 3 weeks s/p Lt arthroscopy including subacromial decompression and distal clavicle resection.  Pt has weaned from sling.  Pt demonstrates reduced Lt shoulder AROM, strength and use with household tasks.  Pt will benefit from skilled PT for safe progression of Lt shoulder strength, AROM and pain management as needed to allow for return to normal use of the Lt UE.   Pt will benefit from skilled therapeutic intervention in order to improve on the following deficits Postural dysfunction;Decreased strength;Impaired flexibility;Pain;Decreased activity tolerance;Decreased endurance;Decreased range of motion   Rehab Potential Good   PT Frequency 2x / week   PT Duration 8 weeks   PT  Treatment/Interventions ADLs/Self Care Home Management;Electrical Stimulation;Moist Heat;Iontophoresis 14m/ml Dexamethasone;Cryotherapy;Functional mobility  training;Therapeutic activities;Therapeutic exercise;Manual techniques;Patient/family education;Neuromuscular re-education;Passive range of motion;Dry needling;Taping   PT Next Visit Plan Gentle AROM and strength progression of the Lt UE.  Pain management as needed.     Consulted and Agree with Plan of Care Patient        Problem List Patient Active Problem List   Diagnosis Date Noted  . Chest pain 02/25/2015  . Abnormal nuclear stress test 02/25/2015  . Hyperlipidemia 07/17/2013  . Osteopenia 01/28/2012  . 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 CELIAC SPRUE 09/29/2009  . Hypothyroidism 09/07/2009  . MENIERE'S DISEASE 09/07/2009  . Essential hypertension 09/07/2009  . RUQ PAIN 09/07/2009  . FIBROMYALGIA 06/12/2009  . CERVICALGIA 06/03/2009  . ANEMIA-IRON DEFICIENCY 10/21/2008  . GERD 10/21/2008  . DIZZINESS 10/21/2008  . COLONIC POLYPS, HX OF 10/21/2008    Sigurd Sos, PT 07/30/2015 9:19 AM  [facility] [address] [place], [place], [address] Phone: [phone]   Fax:  [phone]  Name: RHONDA LINAN MRN: [id number] Date of Birth: Jul 11, 1955

## 2015-07-30 NOTE — Patient Instructions (Signed)
AROM: Lateral Neck Flexion    Slowly tilt head toward one shoulder, then the other. Hold each position _20___ seconds. Repeat _3___ times per set. Do _1___ sets per session. Do __3__ sessions per day.  http://orth.exer.us/297   Copyright  VHI. All rights reserved.  [facility] [address] [place], [place] [address] Phone # [phone] Fax [phone]

## 2015-08-03 ENCOUNTER — Ambulatory Visit

## 2015-08-03 ENCOUNTER — Other Ambulatory Visit: Payer: Self-pay | Admitting: *Deleted

## 2015-08-03 DIAGNOSIS — M25512 Pain in left shoulder: Secondary | ICD-10-CM

## 2015-08-03 DIAGNOSIS — M25612 Stiffness of left shoulder, not elsewhere classified: Secondary | ICD-10-CM

## 2015-08-03 DIAGNOSIS — R29898 Other symptoms and signs involving the musculoskeletal system: Secondary | ICD-10-CM

## 2015-08-03 MED ORDER — LEVOTHYROXINE SODIUM 150 MCG PO TABS: ORAL_TABLET | ORAL | Status: DC

## 2015-08-03 NOTE — Therapy (Signed)
[facility] [address] [place], [place], [address] Phone: [phone]   Fax:  [phone]  Physical Therapy Treatment  Patient Details  Name: Kimberly Stein MRN: [id number] Date of Birth: [birth year] Referring Provider: Joni Fears, MD  Encounter Date: 08/03/2015      PT End of Session - 08/03/15 1004    Visit Number 3   Number of Visits 10  [employer]   Date for PT Re-Evaluation 09/21/15   PT Start Time 0930   PT Stop Time 1011   PT Time Calculation (min) 41 min   Activity Tolerance Patient tolerated treatment well   Behavior During Therapy [facility] for tasks assessed/performed      Past Medical History  Diagnosis Date  . Allergy     sinus infections  . Anemia   . Arthritis   . Cancer ([place])     skin  . GERD (gastroesophageal reflux disease)   . Heart murmur   . Meniere's disease   . Neuromuscular disorder (HCC)     fibromyalgia  . Thyroid disease     hypothyroid  . 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 Celiac disease     Past Surgical History  Procedure Laterality Date  . Total abdominal hysterectomy    . Cholecystectomy    . Colonoscopy    . Upper gastrointestinal endoscopy    . Dilation and curettage of uterus    . Cervical disc surgery    . Mouth surgery  2016  . Cardiac catheterization N/A 02/25/2015    Procedure: Left Heart Cath and Coronary Angiography;  Surgeon: Peter M Martinique, MD;  Location: [place] [facility];  Service: Cardiovascular;  Laterality: N/A;    There were no vitals filed for this visit.  Visit Diagnosis:  Pain in joint of left shoulder  Shoulder stiffness, left  Weakness of shoulder      Subjective Assessment - 08/03/15 0933    Subjective Pt reports that she is doing 75% of normal with Lt UE now.     Patient Stated Goals use Lt arm for ADLs and cleaning, reduce pain, improve AROM and strength   Currently in Pain? Yes   Pain Score 3    Pain Location Shoulder    Pain Orientation Left   Pain Descriptors / Indicators Aching;Sore   Pain Type Surgical pain   Pain Onset 1 to 4 weeks ago   Pain Frequency Constant   Aggravating Factors  reaching overhead or out to the side, movement   Pain Relieving Factors pain meds, rest            [facility] PT Assessment - 08/03/15 0001    Assessment   Medical Diagnosis Lt shoulder arthoscopy, SAD, DCR   AROM   Left Shoulder Flexion 118 Degrees                     [facility] Adult PT Treatment/Exercise - 08/03/15 0001    Exercises   Exercises Shoulder;Neck   Neck Exercises: Seated   Lateral Flexion Both;5 reps   Shoulder Shrugs 20 reps   Shoulder Rolls 20 reps   Shoulder Exercises: Supine   Flexion AAROM;Both;20 reps  with cane   Other Supine Exercises chest press: 2x10   Shoulder Exercises: Standing   Flexion AAROM;Left;10 reps  using finger ladder   Other Standing Exercises cone stack to 1st shelf 2x1 minute   Shoulder Exercises: Pulleys   Flexion 3 minutes  2x3 minutes  Shoulder Exercises: ROM/Strengthening   UBE (Upper Arm Bike) Level 0 x 6 minutes (3/3)   Modalities   Modalities --   Cryotherapy   Number Minutes Cryotherapy --   Cryotherapy Location --   Type of Cryotherapy --   Manual Therapy   Manual Therapy Joint mobilization;Soft tissue mobilization;Passive ROM   Joint Mobilization Lt glenohumeral joint: inferior glide and posterior glide   Soft tissue mobilization posterior shoulder capsule   Passive ROM Lt shoulder into flexion, IR, ER to pt tolerance                  PT Short Term Goals - 08/03/15 0934    PT SHORT TERM GOAL #1   Title be independent in initial HEP   Time 4   Period Weeks   Status On-going  indpendent with current HEP   PT SHORT TERM GOAL #2   Title demonstrate Lt shoulder AROM flexion to > or = to 125 degrees to improve overhead reaching   Time 4   Period Weeks   Status On-going  118 today   PT SHORT TERM GOAL #3   Title perform  self-care overhead and behind the back with 25% increased ease with Lt UE   Status Achieved           PT Long Term Goals - 08/03/15 0934    PT LONG TERM GOAL #1   Title be independent in advanced HEP   Time 8   Period Weeks   Status On-going               Plan - 08/03/15 4580    Clinical Impression Statement Pt has been working at home with HEP for continued gains.  Pt reports that she is using her Lt UE 75% of normal now at home and with ADLs.   Lt shoulder AROM flexion is 118 today.  Pt demonstrates increased ease of movement with Lt UE today with exercise in the clinic.  Pt will bene fit form skilled PT for progression of Lt shoulder strength, AROM and pain management as needed to allow for return to normal use of Lt UE.     Pt will benefit from skilled therapeutic intervention in order to improve on the following deficits Postural dysfunction;Decreased strength;Impaired flexibility;Pain;Decreased activity tolerance;Decreased endurance;Decreased range of motion   Rehab Potential Good   PT Frequency 2x / week   PT Duration 8 weeks   PT Treatment/Interventions ADLs/Self Care Home Management;Electrical Stimulation;Moist Heat;Iontophoresis 21m/ml Dexamethasone;Cryotherapy;Functional mobility training;Therapeutic activities;Therapeutic exercise;Manual techniques;Patient/family education;Neuromuscular re-education;Passive range of motion;Dry needling;Taping   PT Next Visit Plan Gentle AROM and strength progression of the Lt UE.  Pain management as needed.     Consulted and Agree with Plan of Care Patient        Problem List Patient Active Problem List   Diagnosis Date Noted  . Chest pain 02/25/2015  . Abnormal nuclear stress test 02/25/2015  . Hyperlipidemia 07/17/2013  . Osteopenia 01/28/2012  . 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 CELIAC SPRUE 09/29/2009  . Hypothyroidism 09/07/2009  . MENIERE'S DISEASE 09/07/2009  . Essential hypertension 09/07/2009  . RUQ PAIN 09/07/2009   . FIBROMYALGIA 06/12/2009  . CERVICALGIA 06/03/2009  . ANEMIA-IRON DEFICIENCY 10/21/2008  . GERD 10/21/2008  . DIZZINESS 10/21/2008  . COLONIC POLYPS, HX OF 10/21/2008    Gillermo Poch, PT 08/03/2015, 10:05 AM  [facility] [address] [place] [address]: [phone]  Fax:  [phone] Name:  Kimberly Stein MRN: [id number] Date of Birth: [birth year]

## 2015-08-05 ENCOUNTER — Encounter

## 2015-08-10 ENCOUNTER — Telehealth: Payer: Self-pay | Admitting: *Deleted

## 2015-08-10 ENCOUNTER — Encounter: Payer: Self-pay | Admitting: Physical Therapy

## 2015-08-10 ENCOUNTER — Ambulatory Visit: Admitting: Physical Therapy

## 2015-08-10 DIAGNOSIS — M25512 Pain in left shoulder: Secondary | ICD-10-CM | POA: Diagnosis not present

## 2015-08-10 DIAGNOSIS — M25612 Stiffness of left shoulder, not elsewhere classified: Secondary | ICD-10-CM

## 2015-08-10 DIAGNOSIS — R29898 Other symptoms and signs involving the musculoskeletal system: Secondary | ICD-10-CM

## 2015-08-10 NOTE — Telephone Encounter (Signed)
[place] [phone] Refill request  traMADol (ULTRAM) 50 MG tablet

## 2015-08-10 NOTE — Telephone Encounter (Signed)
Refill OK

## 2015-08-10 NOTE — Therapy (Signed)
[facility] [address], [place], [place], [address] Phone: [phone]   Fax:  [phone]  Physical Therapy Treatment  Patient Details  Name: Kimberly Stein MRN: [id number] Date of Birth: 15-Aug-1955 Referring Provider: Joni Fears, MD  Encounter Date: 08/10/2015      PT End of Session - 08/10/15 0900    Visit Number 4   Number of Visits 10  medicare   Date for PT Re-Evaluation 09/21/15   PT Start Time 0845   PT Stop Time 0925   PT Time Calculation (min) 40 min   Activity Tolerance Patient tolerated treatment well   Behavior During Therapy [facility] for tasks assessed/performed      Past Medical History  Diagnosis Date  . Allergy     sinus infections  . Anemia   . Arthritis   . Cancer ([place])     skin  . GERD (gastroesophageal reflux disease)   . Heart murmur   . Meniere's disease   . Neuromuscular disorder (HCC)     fibromyalgia  . Thyroid disease     hypothyroid  . 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 Celiac disease     Past Surgical History  Procedure Laterality Date  . Total abdominal hysterectomy    . Cholecystectomy    . Colonoscopy    . Upper gastrointestinal endoscopy    . Dilation and curettage of uterus    . Cervical disc surgery    . Mouth surgery  2016  . Cardiac catheterization N/A 02/25/2015    Procedure: Left Heart Cath and Coronary Angiography;  Surgeon: Peter M Martinique, MD;  Location: [place] [facility];  Service: Cardiovascular;  Laterality: N/A;    There were no vitals filed for this visit.  Visit Diagnosis:  Pain in joint of left shoulder  Shoulder stiffness, left  Weakness of shoulder      Subjective Assessment - 08/10/15 0849    Subjective I hurt my shoulder this weekenc.  i was down on the floor and reached to get it with weight on her arm.  I had a little set back and increased pain.    Pertinent History Lt shoulder arthroscopty with subacromial decomression  and distal clavicle resection 07/09/15, Celiac disease   Patient Stated Goals use Lt arm for ADLs and cleaning, reduce pain, improve AROM and strength   Currently in Pain? Yes   Pain Score 3    Pain Location Shoulder   Pain Orientation Left   Pain Descriptors / Indicators Sharp   Pain Type Surgical pain   Pain Radiating Towards None   Pain Onset 1 to 4 weeks ago   Pain Frequency Intermittent   Aggravating Factors  reaching overhead, reach out to the side, movement   Pain Relieving Factors pain meds, rest   Effect of Pain on Daily Activities patient            [facility] PT Assessment - 08/10/15 0001    AROM   Left Shoulder Flexion 132 Degrees                     [facility] Adult PT Treatment/Exercise - 08/10/15 0001    Shoulder Exercises: Standing   Flexion AAROM;Left;10 reps  using finger ladder   Flexion Limitations therapist uses tactile cues to relax left upper trap   ABduction AAROM;Strengthening;Left;10 reps;Limitations   ABduction Limitations therapist gives the left scapula tactile cues to move correctly   Shoulder Exercises: Pulleys  Flexion 3 minutes  2x3 minutes   Shoulder Exercises: ROM/Strengthening   UBE (Upper Arm Bike) Level 0 x 6 minutes (3/3)   Manual Therapy   Manual Therapy Joint mobilization;Soft tissue mobilization;Passive ROM   Joint Mobilization Lt glenohumeral joint: inferior glide and posterior glide   Soft tissue mobilization left upper trap, left pectoral, left RTC, left triceps, left subscapularis, left interscapular, A-C joint   Passive ROM left shoulder flexion, abduction, ER                  PT Short Term Goals - 08/10/15 9379    PT SHORT TERM GOAL #1   Title be independent in initial HEP   Time 4   Period Weeks   Status Achieved   PT SHORT TERM GOAL #2   Title demonstrate Lt shoulder AROM flexion to > or = to 125 degrees to improve overhead reaching   Time 4   Period Weeks   Status Achieved  132 degrees   PT SHORT  TERM GOAL #3   Title perform self-care overhead and behind the back with 25% increased ease with Lt UE   Time 4   Period Weeks   Status Achieved  45% better   PT SHORT TERM GOAL #4   Title demonstrate Lt shoulder IR to L3 to improve reaching behind the back   Time 4   Period Weeks   Status Achieved  L5           PT Long Term Goals - 08/03/15 0934    PT LONG TERM GOAL #1   Title be independent in advanced HEP   Time 8   Period Weeks   Status On-going               Plan - 08/10/15 0926    Clinical Impression Statement Patient has met her STG's.  Patient has increased left shoulder IR to L1 and flexion to 132 degrees.  Patient able to reach behind her back with 40% greater ease.  Patient will contract her left upper trap wiht left shoulder flexion and abduction.  Patient reports  pain level decreased to 0/10 after treatment. Patient will  benefit from physical therapy to reduce pain and incresae function.    Pt will benefit from skilled therapeutic intervention in order to improve on the following deficits Postural dysfunction;Decreased strength;Impaired flexibility;Pain;Decreased activity tolerance;Decreased endurance;Decreased range of motion   Rehab Potential Good   Clinical Impairments Affecting Rehab Potential None   PT Frequency 2x / week   PT Duration 8 weeks   PT Treatment/Interventions ADLs/Self Care Home Management;Electrical Stimulation;Moist Heat;Iontophoresis 61m/ml Dexamethasone;Cryotherapy;Functional mobility training;Therapeutic activities;Therapeutic exercise;Manual techniques;Patient/family education;Neuromuscular re-education;Passive range of motion;Dry needling;Taping   PT Next Visit Plan Gentle AROM and strength progression of the Lt UE.  Pain management as needed.  Work on relaxing left upper trap with shulder flexion and abduction.    PT Home Exercise Plan progress as needed   Recommended Other Services None   Consulted and Agree with Plan of Care  Patient        Problem List Patient Active Problem List   Diagnosis Date Noted  . Chest pain 02/25/2015  . Abnormal nuclear stress test 02/25/2015  . Hyperlipidemia 07/17/2013  . Osteopenia 01/28/2012  . 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 CELIAC SPRUE 09/29/2009  . Hypothyroidism 09/07/2009  . MENIERE'S DISEASE 09/07/2009  . Essential hypertension 09/07/2009  . RUQ PAIN 09/07/2009  . FIBROMYALGIA 06/12/2009  . CERVICALGIA 06/03/2009  . ANEMIA-IRON DEFICIENCY 10/21/2008  .  GERD 10/21/2008  . DIZZINESS 10/21/2008  . COLONIC POLYPS, HX OF 10/21/2008    Earlie Counts, PT 08/10/2015 9:29 AM   [facility] [address], [place], [place], [address] Phone: [phone]   Fax:  [phone]  Name: Kimberly Stein MRN: [id number] Date of Birth: 04-28-1956

## 2015-08-10 NOTE — Telephone Encounter (Signed)
Last refill was #240, 1rf --06-03-2015 No pending appt Last seen on 12-29-2014 Please advise

## 2015-08-11 MED ORDER — TRAMADOL HCL 50 MG PO TABS: ORAL_TABLET | ORAL | Status: DC

## 2015-08-11 NOTE — Telephone Encounter (Signed)
Verbally called in for patient.  

## 2015-08-12 ENCOUNTER — Ambulatory Visit

## 2015-08-12 DIAGNOSIS — M25512 Pain in left shoulder: Secondary | ICD-10-CM

## 2015-08-12 DIAGNOSIS — M25612 Stiffness of left shoulder, not elsewhere classified: Secondary | ICD-10-CM

## 2015-08-12 DIAGNOSIS — R29898 Other symptoms and signs involving the musculoskeletal system: Secondary | ICD-10-CM

## 2015-08-12 NOTE — Therapy (Signed)
[facility] [address], [place], [place], [address] Phone: [phone]   Fax:  [phone]  Physical Therapy Treatment  Patient Details  Name: Kimberly Stein MRN: [id number] Date of Birth: Aug 15, 1955 Referring Provider: Joni Fears, MD  Encounter Date: 08/12/2015      PT End of Session - 08/12/15 1010    Visit Number 5   Number of Visits 10   Date for PT Re-Evaluation 09/21/15   PT Start Time 0927   PT Stop Time 1010   PT Time Calculation (min) 43 min   Activity Tolerance Patient tolerated treatment well   Behavior During Therapy [facility] for tasks assessed/performed      Past Medical History  Diagnosis Date  . Allergy     sinus infections  . Anemia   . Arthritis   . Cancer ([place])     skin  . GERD (gastroesophageal reflux disease)   . Heart murmur   . Meniere's disease   . Neuromuscular disorder (HCC)     fibromyalgia  . Thyroid disease     hypothyroid  . 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 Celiac disease     Past Surgical History  Procedure Laterality Date  . Total abdominal hysterectomy    . Cholecystectomy    . Colonoscopy    . Upper gastrointestinal endoscopy    . Dilation and curettage of uterus    . Cervical disc surgery    . Mouth surgery  2016  . Cardiac catheterization N/A 02/25/2015    Procedure: Left Heart Cath and Coronary Angiography;  Surgeon: Peter M Martinique, MD;  Location: [place] [facility];  Service: Cardiovascular;  Laterality: N/A;    There were no vitals filed for this visit.  Visit Diagnosis:  Pain in joint of left shoulder  Shoulder stiffness, left  Weakness of shoulder      Subjective Assessment - 08/12/15 0932    Subjective Lt shoulder is feeling better after hurting it over the weekend.  Treatment last session helped.     Patient Stated Goals use Lt arm for ADLs and cleaning, reduce pain, improve AROM and strength   Currently in Pain? Yes   Pain Score 3     Pain Location Shoulder   Pain Orientation Left   Pain Descriptors / Indicators Sharp   Pain Type Surgical pain   Pain Onset 1 to 4 weeks ago   Pain Frequency Intermittent   Aggravating Factors  reaching overhead, reaching out to the side, movement   Pain Relieving Factors pain meds, rest            [facility] PT Assessment - 08/12/15 0001    Assessment   Medical Diagnosis Lt shoulder arthoscopy, SAD, DCR   Onset Date/Surgical Date 07/09/15   AROM   Overall AROM  Deficits   AROM Assessment Site Shoulder   Right/Left Shoulder Left   Left Shoulder Flexion 131 Degrees   Left Shoulder ABduction 116 Degrees  with scapular elevation   Left Shoulder Internal Rotation --  to T11   Left Shoulder External Rotation --  lacking 1 inch vs the Rt                     [facility] Adult PT Treatment/Exercise - 08/12/15 0001    Shoulder Exercises: Standing   Flexion AAROM;Left;10 reps  using finger ladder   Shoulder Flexion Weight (lbs) 1   Flexion Limitations therapist uses tactile cues to relax left  upper trap   ABduction AAROM;Strengthening;Left;10 reps;Limitations   Shoulder ABduction Weight (lbs) 1   Shoulder Exercises: Pulleys   Flexion 3 minutes   ABduction 3 minutes   Shoulder Exercises: ROM/Strengthening   UBE (Upper Arm Bike) Level 0 x 6 minutes (3/3)   Manual Therapy   Manual Therapy Joint mobilization;Soft tissue mobilization;Passive ROM   Joint Mobilization Lt glenohumeral joint: inferior glide and posterior glide   Soft tissue mobilization left upper trap, left pectoral, left RTC, left triceps, left subscapularis, left interscapular, A-C joint   Passive ROM left shoulder flexion, abduction, ER                  PT Short Term Goals - 08/10/15 1275    PT SHORT TERM GOAL #1   Title be independent in initial HEP   Time 4   Period Weeks   Status Achieved   PT SHORT TERM GOAL #2   Title demonstrate Lt shoulder AROM flexion to > or = to 125 degrees to  improve overhead reaching   Time 4   Period Weeks   Status Achieved  132 degrees   PT SHORT TERM GOAL #3   Title perform self-care overhead and behind the back with 25% increased ease with Lt UE   Time 4   Period Weeks   Status Achieved  45% better   PT SHORT TERM GOAL #4   Title demonstrate Lt shoulder IR to L3 to improve reaching behind the back   Time 4   Period Weeks   Status Achieved  L5           PT Long Term Goals - 08/03/15 0934    PT LONG TERM GOAL #1   Title be independent in advanced HEP   Time 8   Period Weeks   Status On-going               Plan - 08/12/15 1700    Clinical Impression Statement Pt with improved Lt shoulder AROM and use with exercise.  Pt tolerated 1# weight added to Lt UE with finger ladder today.  Pt requires tactile and verbal cues to reduce scapular elevation with Lt UE movement overhead.  Pt will continue to benefit from skilled PT for Lt shoulder strength, ROM and endurance to allow for return to regular use of Lt UE.     Pt will benefit from skilled therapeutic intervention in order to improve on the following deficits Postural dysfunction;Decreased strength;Impaired flexibility;Pain;Decreased activity tolerance;Decreased endurance;Decreased range of motion   Rehab Potential Good   PT Frequency 2x / week   PT Duration 8 weeks   PT Treatment/Interventions ADLs/Self Care Home Management;Electrical Stimulation;Moist Heat;Iontophoresis 57m/ml Dexamethasone;Cryotherapy;Functional mobility training;Therapeutic activities;Therapeutic exercise;Manual techniques;Patient/family education;Neuromuscular re-education;Passive range of motion;Dry needling;Taping   PT Next Visit Plan Gentle AROM and strength progression of the Lt UE.  Pain management as needed.     Consulted and Agree with Plan of Care Patient        Problem List Patient Active Problem List   Diagnosis Date Noted  . Chest pain 02/25/2015  . Abnormal nuclear stress test  02/25/2015  . Hyperlipidemia 07/17/2013  . Osteopenia 01/28/2012  . 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 CELIAC SPRUE 09/29/2009  . Hypothyroidism 09/07/2009  . MENIERE'S DISEASE 09/07/2009  . Essential hypertension 09/07/2009  . RUQ PAIN 09/07/2009  . FIBROMYALGIA 06/12/2009  . CERVICALGIA 06/03/2009  . ANEMIA-IRON DEFICIENCY 10/21/2008  . GERD 10/21/2008  . DIZZINESS 10/21/2008  . COLONIC POLYPS, HX OF  10/21/2008   Sigurd Sos, PT 08/12/2015 10:11 AM  [facility] [address] [place], [place], [address] Phone: [phone]   Fax:  [phone]  Name: Kimberly Stein MRN: [id number] Date of Birth: 04-10-1956

## 2015-08-17 ENCOUNTER — Ambulatory Visit

## 2015-08-17 DIAGNOSIS — M25512 Pain in left shoulder: Secondary | ICD-10-CM | POA: Diagnosis not present

## 2015-08-17 DIAGNOSIS — M25612 Stiffness of left shoulder, not elsewhere classified: Secondary | ICD-10-CM

## 2015-08-17 DIAGNOSIS — R29898 Other symptoms and signs involving the musculoskeletal system: Secondary | ICD-10-CM

## 2015-08-17 NOTE — Therapy (Signed)
[facility] [address] [place], [place], [address] Phone: [phone]   Fax:  [phone]  Physical Therapy Treatment  Patient Details  Name: Kimberly Stein MRN: [id number] Date of Birth: September 19, 1955 Referring Provider: Joni Fears, MD  Encounter Date: 08/17/2015      PT End of Session - 08/17/15 1006    Visit Number 6   Number of Visits 10   Date for PT Re-Evaluation 09/21/15   PT Start Time 0928   PT Stop Time 1010   PT Time Calculation (min) 42 min   Activity Tolerance Patient tolerated treatment well   Behavior During Therapy [facility] for tasks assessed/performed      Past Medical History  Diagnosis Date  . Allergy     sinus infections  . Anemia   . Arthritis   . Cancer ([place])     skin  . GERD (gastroesophageal reflux disease)   . Heart murmur   . Meniere's disease   . Neuromuscular disorder (HCC)     fibromyalgia  . Thyroid disease     hypothyroid  . 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 Celiac disease     Past Surgical History  Procedure Laterality Date  . Total abdominal hysterectomy    . Cholecystectomy    . Colonoscopy    . Upper gastrointestinal endoscopy    . Dilation and curettage of uterus    . Cervical disc surgery    . Mouth surgery  2016  . Cardiac catheterization N/A 02/25/2015    Procedure: Left Heart Cath and Coronary Angiography;  Surgeon: Peter M Martinique, MD;  Location: [place] [facility];  Service: Cardiovascular;  Laterality: N/A;    There were no vitals filed for this visit.  Visit Diagnosis:  Pain in joint of left shoulder  Shoulder stiffness, left  Weakness of shoulder      Subjective Assessment - 08/17/15 0934    Subjective I've been using my Lt arm more and more each day.     Pertinent History Lt shoulder arthroscopty with subacromial decomression and distal clavicle resection 07/09/15, Celiac disease   Patient Stated Goals use Lt arm for ADLs and  cleaning, reduce pain, improve AROM and strength   Currently in Pain? Yes   Pain Score 2    Pain Location Shoulder   Pain Orientation Left   Pain Descriptors / Indicators Sharp   Pain Type Surgical pain   Pain Onset More than a month ago   Pain Frequency Intermittent   Aggravating Factors  reaching overhead, reaching out to the side, overuse of Lt UE   Pain Relieving Factors rest                         [facility] Adult PT Treatment/Exercise - 08/17/15 0001    Shoulder Exercises: Standing   Flexion AAROM;Left;10 reps  using finger ladder   Shoulder Flexion Weight (lbs) 1   Flexion Limitations therapist uses tactile cues to relax left upper trap   ABduction AAROM;Strengthening;Left;10 reps;Limitations   Shoulder ABduction Weight (lbs) 1   Other Standing Exercises cone stack to 1st shelf 2x1 minute   Other Standing Exercises [place]: red band 4 ways 2x10 each   Shoulder Exercises: Pulleys   Flexion 3 minutes   ABduction 3 minutes   Shoulder Exercises: ROM/Strengthening   UBE (Upper Arm Bike) Level 1 x 6 minutes (3/3)  PT Short Term Goals - 08/17/15 0935    PT SHORT TERM GOAL #1   Title be independent in initial HEP   Status Achieved   PT SHORT TERM GOAL #2   Title demonstrate Lt shoulder AROM flexion to > or = to 125 degrees to improve overhead reaching   Status Achieved   PT SHORT TERM GOAL #3   Title perform self-care overhead and behind the back with 25% increased ease with Lt UE   Status Achieved   PT SHORT TERM GOAL #4   Title demonstrate Lt shoulder IR to L3 to improve reaching behind the back   Status Achieved           PT Long Term Goals - 08/17/15 0935    PT LONG TERM GOAL #1   Title be independent in advanced HEP   Time 8   Period Weeks   Status On-going   PT LONG TERM GOAL #4   Title report a 60% reduction in Lt shoulder pain with use   Status Achieved   PT LONG TERM GOAL #5   Title demonstrate Lt shoulder  AROM flexion to > or = to 145 degrees to imrpove overhead use   Time 8   Period Weeks   Status On-going               Plan - 08/17/15 0936    Clinical Impression Statement Pt reports 85% overall improvement in Lt shoulder pain with use.  Pt reports that she is using Lt UE to lift dog (15#) and reaching overhead.  Limited with reaching behind the back and repetative motions overhead.  Pt able to tolerate increased weight on Lt UE with finger ladder.  Pt will continue to benefit form skilled PT for Lt shoulder strength, ROM and endurance progression s/p surgery.     Pt will benefit from skilled therapeutic intervention in order to improve on the following deficits Postural dysfunction;Decreased strength;Impaired flexibility;Pain;Decreased activity tolerance;Decreased endurance;Decreased range of motion   Rehab Potential Good   PT Frequency 2x / week   PT Duration 8 weeks   PT Treatment/Interventions ADLs/Self Care Home Management;Electrical Stimulation;Moist Heat;Iontophoresis 5m/ml Dexamethasone;Cryotherapy;Functional mobility training;Therapeutic activities;Therapeutic exercise;Manual techniques;Patient/family education;Neuromuscular re-education;Passive range of motion;Dry needling;Taping   PT Next Visit Plan Gentle AROM and strength progression of the Lt UE.  Pain management as needed.  Add [place] to HEP.   Consulted and Agree with Plan of Care Patient        Problem List Patient Active Problem List   Diagnosis Date Noted  . Chest pain 02/25/2015  . Abnormal nuclear stress test 02/25/2015  . Hyperlipidemia 07/17/2013  . Osteopenia 01/28/2012  . 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 CELIAC SPRUE 09/29/2009  . Hypothyroidism 09/07/2009  . MENIERE'S DISEASE 09/07/2009  . Essential hypertension 09/07/2009  . RUQ PAIN 09/07/2009  . FIBROMYALGIA 06/12/2009  . CERVICALGIA 06/03/2009  . ANEMIA-IRON DEFICIENCY 10/21/2008  . GERD 10/21/2008  . DIZZINESS 10/21/2008  . COLONIC  POLYPS, HX OF 10/21/2008    KSigurd Sos PT 08/17/2015 10:09 AM  [facility] [address] [place] [address]: [phone]  Fax:  [phone] Name: Kimberly MARKSBERRYMRN: [id number] of Birth: 709/05/1955

## 2015-08-18 ENCOUNTER — Encounter: Payer: Self-pay | Admitting: Gastroenterology

## 2015-08-19 ENCOUNTER — Ambulatory Visit

## 2015-08-19 DIAGNOSIS — M25512 Pain in left shoulder: Secondary | ICD-10-CM | POA: Diagnosis not present

## 2015-08-19 DIAGNOSIS — M25612 Stiffness of left shoulder, not elsewhere classified: Secondary | ICD-10-CM

## 2015-08-19 DIAGNOSIS — R29898 Other symptoms and signs involving the musculoskeletal system: Secondary | ICD-10-CM

## 2015-08-19 NOTE — Therapy (Signed)
[facility] [address], [place], [place], [address] Phone: [phone]   Fax:  [phone]  Physical Therapy Treatment  Patient Details  Name: Kimberly Stein MRN: [id number] Date of Birth: [birth year] Referring Provider: Joni Fears, MD  Encounter Date: 08/19/2015      PT End of Session - 08/19/15 1042    Visit Number 7   Number of Visits 10   Date for PT Re-Evaluation 09/21/15   PT Start Time 1012   PT Stop Time 1058   PT Time Calculation (min) 46 min   Activity Tolerance Patient tolerated treatment well   Behavior During Therapy [facility] for tasks assessed/performed      Past Medical History  Diagnosis Date  . Allergy     sinus infections  . Anemia   . Arthritis   . Cancer ([place])     skin  . GERD (gastroesophageal reflux disease)   . Heart murmur   . Meniere's disease   . Neuromuscular disorder (HCC)     fibromyalgia  . Thyroid disease     hypothyroid  . 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 Celiac disease     Past Surgical History  Procedure Laterality Date  . Total abdominal hysterectomy    . Cholecystectomy    . Colonoscopy    . Upper gastrointestinal endoscopy    . Dilation and curettage of uterus    . Cervical disc surgery    . Mouth surgery  2016  . Cardiac catheterization N/A 02/25/2015    Procedure: Left Heart Cath and Coronary Angiography;  Surgeon: Peter M Martinique, MD;  Location: [place] [facility];  Service: Cardiovascular;  Laterality: N/A;    There were no vitals filed for this visit.  Visit Diagnosis:  Pain in joint of left shoulder  Shoulder stiffness, left  Weakness of shoulder      Subjective Assessment - 08/19/15 1013    Subjective Knee is really hurting.  Pt is limping.  Lt shoulder pain is a little more today.  Will see MD on Friday-rescheduled the appt.   Patient Stated Goals use Lt arm for ADLs and cleaning, reduce pain, improve AROM and strength   Currently  in Pain? Yes   Pain Score 4    Pain Location Shoulder   Pain Orientation Left   Pain Descriptors / Indicators Aching   Pain Type Surgical pain   Pain Onset More than a month ago   Pain Frequency Intermittent   Aggravating Factors  reaching overhead, reaching out ot the side, moving it   Pain Relieving Factors rest, ice            [facility] PT Assessment - 08/19/15 0001    Assessment   Medical Diagnosis Lt shoulder arthoscopy, SAD, DCR   Onset Date/Surgical Date 07/09/15   ROM / Strength   AROM / PROM / Strength AROM;PROM;Strength   AROM   Overall AROM  Deficits   AROM Assessment Site Shoulder   Right/Left Shoulder Left   Left Shoulder Flexion 132 Degrees   Left Shoulder ABduction 123 Degrees   Left Shoulder Internal Rotation --  to T11   Left Shoulder External Rotation --  1" lacking vs the Rt   Strength   Overall Strength Deficits   Strength Assessment Site Shoulder   Right/Left Shoulder Left   Left Shoulder Flexion 4/5   Left Shoulder Extension 4-/5   Left Shoulder ABduction 4+/5   Left Shoulder Internal Rotation 4/5  [facility] Adult PT Treatment/Exercise - 08/19/15 0001    Shoulder Exercises: Standing   Flexion AAROM;Left;10 reps  using finger ladder   Shoulder Flexion Weight (lbs) 1   Flexion Limitations therapist uses tactile cues to relax left upper trap   ABduction AAROM;Strengthening;Left;10 reps;Limitations   Shoulder ABduction Weight (lbs) 1   Other Standing Exercises cone stack to 1st shelf 2x1 minute   Other Standing Exercises [place]: red band 4 ways 2x10 each   Shoulder Exercises: Pulleys   Flexion 3 minutes   ABduction 3 minutes   Shoulder Exercises: ROM/Strengthening   UBE (Upper Arm Bike) Level 1 x 6 minutes (3/3)   Manual Therapy   Manual Therapy Joint mobilization;Soft tissue mobilization;Passive ROM   Joint Mobilization Lt glenohumeral joint: inferior glide and posterior glide   Passive ROM left shoulder flexion,  abduction, ER                PT Education - 08/19/15 1026    Education provided Yes   Education Details [place] 4 ways   Person(s) Educated Patient   Methods Explanation;Demonstration;Handout   Comprehension Verbalized understanding;Returned demonstration          PT Short Term Goals - 08/17/15 0935    PT SHORT TERM GOAL #1   Title be independent in initial HEP   Status Achieved   PT SHORT TERM GOAL #2   Title demonstrate Lt shoulder AROM flexion to > or = to 125 degrees to improve overhead reaching   Status Achieved   PT SHORT TERM GOAL #3   Title perform self-care overhead and behind the back with 25% increased ease with Lt UE   Status Achieved   PT SHORT TERM GOAL #4   Title demonstrate Lt shoulder IR to L3 to improve reaching behind the back   Status Achieved           PT Long Term Goals - 08/19/15 1041    PT LONG TERM GOAL #3   Title demonstrate Lt shoulder strength to > or = to 4+/5 throughout to improve endurance with ADLs and self-care tasks   Time 8   Period Weeks   Status On-going   PT LONG TERM GOAL #4   Title report a 60% reduction in Lt shoulder pain with use   Status Achieved   PT LONG TERM GOAL #5   Title demonstrate Lt shoulder AROM flexion to > or = to 145 degrees to imrpove overhead use   Time 8   Period Weeks   Status On-going               Plan - 08/19/15 1015    Clinical Impression Statement Pt with flare-up of pain today in the Rt shoulder and pt rates the pain as 4/10 today.  Pt is limited with reaching behind the back and with repetative motion overhead.   Pt able to tolerate 1# weight with overhead motion today.  Pt will continue to benefit from skilled PT for Lt shoulder strength, AROM and endurance progression s/p surgery.     Pt will benefit from skilled therapeutic intervention in order to improve on the following deficits Postural dysfunction;Decreased strength;Impaired flexibility;Pain;Decreased activity  tolerance;Decreased endurance;Decreased range of motion   Rehab Potential Good   PT Frequency 2x / week   PT Duration 8 weeks   PT Next Visit Plan Gentle AROM and strength progression of the Lt UE.  Pain management as needed.     Consulted and Agree with Plan of Care  Patient        Problem List Patient Active Problem List   Diagnosis Date Noted  . Chest pain 02/25/2015  . Abnormal nuclear stress test 02/25/2015  . Hyperlipidemia 07/17/2013  . Osteopenia 01/28/2012  . 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 CELIAC SPRUE 09/29/2009  . Hypothyroidism 09/07/2009  . MENIERE'S DISEASE 09/07/2009  . Essential hypertension 09/07/2009  . RUQ PAIN 09/07/2009  . FIBROMYALGIA 06/12/2009  . CERVICALGIA 06/03/2009  . ANEMIA-IRON DEFICIENCY 10/21/2008  . GERD 10/21/2008  . DIZZINESS 10/21/2008  . COLONIC POLYPS, HX OF 10/21/2008   Sigurd Sos, PT 08/19/2015 10:44 AM  [facility] [address], [place], [place], [address] Phone: [phone]   Fax:  [phone]  Name: DOSSIE OCANAS MRN: [id number] Date of Birth: 1955/10/28

## 2015-08-19 NOTE — Patient Instructions (Signed)
Strengthening: Resisted Internal Rotation   Hold tubing in left hand, elbow at side and forearm out. Rotate forearm in across body. Repeat _10___ times per set. Do _2___ sets per session. Do _1-2___ sessions per day.  http://orth.exer.us/830   Copyright  VHI. All rights reserved.  Strengthening: Resisted External Rotation   Hold tubing in right hand, elbow at side and forearm across body. Rotate forearm out. Repeat __10__ times per set. Do _2___ sets per session. Do 1-2____ sessions per day.  http://orth.exer.us/828   Copyright  VHI. All rights reserved.  Strengthening: Resisted Flexion   Hold tubing with left arm at side. Pull forward and up. Move shoulder through pain-free range of motion. Repeat _10___ times per set. Do _2___ sets per session. Do _1-2___ sessions per day.  http://orth.exer.us/824   Copyright  VHI. All rights reserved.  Strengthening: Resisted Extension   Hold tubing in right hand, arm forward. Pull arm back, elbow straight. Repeat _10___ times per set. Do ____ sets per session. Do ____ sessions per day.  http://orth.exer.us/832   Copyright  VHI. All rights reserved.  [facility] [address] [place], [place] [address] Phone # [phone] Fax [phone]

## 2015-08-24 ENCOUNTER — Other Ambulatory Visit: Payer: Self-pay | Admitting: Orthopaedic Surgery

## 2015-08-24 DIAGNOSIS — M25552 Pain in left hip: Secondary | ICD-10-CM

## 2015-08-25 ENCOUNTER — Ambulatory Visit: Attending: Orthopaedic Surgery | Admitting: Physical Therapy

## 2015-08-25 ENCOUNTER — Encounter: Payer: Self-pay | Admitting: Physical Therapy

## 2015-08-25 DIAGNOSIS — M25612 Stiffness of left shoulder, not elsewhere classified: Secondary | ICD-10-CM

## 2015-08-25 DIAGNOSIS — R29898 Other symptoms and signs involving the musculoskeletal system: Secondary | ICD-10-CM

## 2015-08-25 DIAGNOSIS — M25512 Pain in left shoulder: Secondary | ICD-10-CM | POA: Diagnosis not present

## 2015-08-25 NOTE — Therapy (Signed)
[facility] [address], [place], [place], [address] Phone: [phone]   Fax:  [phone]  Physical Therapy Treatment  Patient Details  Name: Kimberly Stein MRN: [id number] Date of Birth: Nov 11, 1955 Referring Provider: Joni Fears, MD  Encounter Date: 08/25/2015      PT End of Session - 08/25/15 0955    Visit Number 8   Date for PT Re-Evaluation 09/21/15   PT Start Time 0930   PT Stop Time 1749  patient was ready to leave early   PT Time Calculation (min) 26 min   Activity Tolerance Patient tolerated treatment well   Behavior During Therapy [facility] for tasks assessed/performed      Past Medical History  Diagnosis Date  . Allergy     sinus infections  . Anemia   . Arthritis   . Cancer ([place])     skin  . GERD (gastroesophageal reflux disease)   . Heart murmur   . Meniere's disease   . Neuromuscular disorder (HCC)     fibromyalgia  . Thyroid disease     hypothyroid  . 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 Celiac disease     Past Surgical History  Procedure Laterality Date  . Total abdominal hysterectomy    . Cholecystectomy    . Colonoscopy    . Upper gastrointestinal endoscopy    . Dilation and curettage of uterus    . Cervical disc surgery    . Mouth surgery  2016  . Cardiac catheterization N/A 02/25/2015    Procedure: Left Heart Cath and Coronary Angiography;  Surgeon: Peter M Martinique, MD;  Location: [place] [facility];  Service: Cardiovascular;  Laterality: N/A;    There were no vitals filed for this visit.  Visit Diagnosis:  Pain in joint of left shoulder  Shoulder stiffness, left  Weakness of shoulder      Subjective Assessment - 08/25/15 0941    Subjective Shoulder is much better.  Left shoulder is 95% better.  i can reach behind my back. I am ready for discharge.    Patient Stated Goals use Lt arm for ADLs and cleaning, reduce pain, improve AROM and strength   Currently in  Pain? No/denies            [facility] PT Assessment - 08/25/15 0001    Assessment   Medical Diagnosis Lt shoulder arthoscopy, SAD, DCR   Onset Date/Surgical Date 07/09/15   Precautions   Precautions Other (comment)   Balance Screen   Has the patient fallen in the past 6 months No   Has the patient had a decrease in activity level because of a fear of falling?  No   Is the patient reluctant to leave their home because of a fear of falling?  No   Prior Function   Level of Independence Independent   Cognition   Overall Cognitive Status Within Functional Limits for tasks assessed   Observation/Other Assessments   Focus on Therapeutic Outcomes (FOTO)  12% limitation   ROM / Strength   AROM / PROM / Strength AROM;PROM;Strength   AROM   AROM Assessment Site Shoulder   Right/Left Shoulder Left   Left Shoulder Flexion 150 Degrees   Left Shoulder ABduction 167 Degrees   Left Shoulder Internal Rotation --  T7   Left Shoulder External Rotation 90 Degrees   Strength   Strength Assessment Site Shoulder   Right/Left Shoulder Left   Left Shoulder Flexion 5/5   Left  Shoulder Extension 5/5   Left Shoulder ABduction 5/5   Left Shoulder Internal Rotation 5/5                     [facility] Adult PT Treatment/Exercise - 08/25/15 0001    Shoulder Exercises: ROM/Strengthening   UBE (Upper Arm Bike) Level 1 x 6 minutes (3/3)                PT Education - 08/25/15 0956    Education provided Yes   Education Details verbally reviewed HEP and patient is independent   Person(s) Educated Patient   Methods Explanation   Comprehension Verbalized understanding          PT Short Term Goals - 08/25/15 0944    PT SHORT TERM GOAL #1   Title be independent in initial HEP   Time 4   Period Weeks   Status Achieved   PT SHORT TERM GOAL #2   Title demonstrate Lt shoulder AROM flexion to > or = to 125 degrees to improve overhead reaching   Time 4   Period Weeks   Status Achieved    PT SHORT TERM GOAL #3   Title perform self-care overhead and behind the back with 25% increased ease with Lt UE   Time 4   Period Weeks   Status Achieved   PT SHORT TERM GOAL #4   Title demonstrate Lt shoulder IR to L3 to improve reaching behind the back   Time 4   Period Weeks   Status Achieved           PT Long Term Goals - 08/25/15 0945    PT LONG TERM GOAL #1   Title be independent in advanced HEP   Time 8   Period Weeks   Status Achieved   PT LONG TERM GOAL #2   Title reduce FOTO to < or = to 33% limitation   Time 8   Period Weeks   Status Achieved   PT LONG TERM GOAL #3   Title demonstrate Lt shoulder strength to > or = to 4+/5 throughout to improve endurance with ADLs and self-care tasks   Time 8   Period Weeks   Status Achieved   PT LONG TERM GOAL #4   Title report a 60% reduction in Lt shoulder pain with use   Time 8   Period Weeks   Status Achieved   PT LONG TERM GOAL #5   Title demonstrate Lt shoulder AROM flexion to > or = to 145 degrees to imrpove overhead use   Time 8   Period Weeks   Status Achieved               Plan - 08/25/15 0957    Clinical Impression Statement Patient reports she is ready for discharge.  Patient has met all of her goals.  Patient reports no pain.  Patient can reach behind her back without difficulty.  Patient strength isn left shoulder is 5/5.  Patient has full AROM of left shoulder.    Pt will benefit from skilled therapeutic intervention in order to improve on the following deficits Postural dysfunction;Decreased strength;Impaired flexibility;Pain;Decreased activity tolerance;Decreased endurance;Decreased range of motion   Clinical Impairments Affecting Rehab Potential None   PT Duration 8 weeks   PT Treatment/Interventions ADLs/Self Care Home Management;Electrical Stimulation;Moist Heat;Iontophoresis 21m/ml Dexamethasone;Cryotherapy;Functional mobility training;Therapeutic activities;Therapeutic exercise;Manual  techniques;Patient/family education;Neuromuscular re-education;Passive range of motion;Dry needling;Taping   PT Next Visit Plan Discharge to [place]  Exercise Plan current HEP   Consulted and Agree with Plan of Care Patient          G-Codes - 2015/09/11 1002    Functional Assessment Tool Used FOTO: 12%  limitation   Other PT Primary Current Status (H9144) At least 40 percent but less than 60 percent impaired, limited or restricted   Other PT Primary Goal Status (Q5848) At least 1 percent but less than 20 percent impaired, limited or restricted      Problem List Patient Active Problem List   Diagnosis Date Noted  . Chest pain 02/25/2015  . Abnormal nuclear stress test 02/25/2015  . Hyperlipidemia 07/17/2013  . Osteopenia 01/28/2012  . 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 CELIAC SPRUE 09/29/2009  . Hypothyroidism 09/07/2009  . MENIERE'S DISEASE 09/07/2009  . Essential hypertension 09/07/2009  . RUQ PAIN 09/07/2009  . FIBROMYALGIA 06/12/2009  . CERVICALGIA 06/03/2009  . ANEMIA-IRON DEFICIENCY 10/21/2008  . GERD 10/21/2008  . DIZZINESS 10/21/2008  . COLONIC POLYPS, HX OF 10/21/2008    Earlie Counts, PT 2015/09/11 10:03 AM   [facility] [address] [place], [place], [address] Phone: [phone]   Fax:  [phone]  Name: ELYANNA WALLICK MRN: [id number] Date of Birth: 10/31/1955  PHYSICAL THERAPY DISCHARGE SUMMARY  Visits from Start of Care:8  Current functional level related to goals / functional outcomes: See above.  Patient has met all of her goals.    Remaining deficits: See above.   Education / Equipment: HEP Plan: Patient agrees to discharge.  Patient goals were met. Patient is being discharged due to meeting the stated rehab goals. Thank you for the referral. Earlie Counts, PT 11-Sep-2015 10:04 AM   ?????

## 2015-08-27 ENCOUNTER — Encounter

## 2015-09-01 ENCOUNTER — Encounter: Admitting: Physical Therapy

## 2015-09-02 ENCOUNTER — Ambulatory Visit
Admission: RE | Admit: 2015-09-02 | Discharge: 2015-09-02 | Disposition: A | Discharge status: Home / Self Care | Source: Ambulatory Visit

## 2015-09-02 DIAGNOSIS — Z1231 Encounter for screening mammogram for malignant neoplasm of breast: Secondary | ICD-10-CM

## 2015-09-03 ENCOUNTER — Encounter

## 2015-09-04 ENCOUNTER — Other Ambulatory Visit

## 2015-09-11 ENCOUNTER — Other Ambulatory Visit

## 2015-09-25 ENCOUNTER — Other Ambulatory Visit

## 2015-10-10 ENCOUNTER — Other Ambulatory Visit: Payer: Self-pay | Admitting: Family Medicine

## 2015-10-12 ENCOUNTER — Other Ambulatory Visit: Payer: Self-pay

## 2015-10-12 NOTE — Telephone Encounter (Signed)
Last OV 12/29/2014  No pending appt Last refill 08/11/2015 #240, 1rf

## 2015-10-12 NOTE — Telephone Encounter (Signed)
Refill once.  Needs office follow up.

## 2015-10-26 ENCOUNTER — Other Ambulatory Visit

## 2015-10-28 ENCOUNTER — Other Ambulatory Visit: Payer: Self-pay | Admitting: *Deleted

## 2015-10-28 MED ORDER — ESTRADIOL 1 MG PO TABS: 1.0000 mg | ORAL_TABLET | Freq: Every day | ORAL | Status: DC

## 2015-11-14 ENCOUNTER — Other Ambulatory Visit: Payer: Self-pay | Admitting: Cardiovascular Disease

## 2015-11-16 ENCOUNTER — Other Ambulatory Visit: Payer: Self-pay | Admitting: Family Medicine

## 2015-11-16 NOTE — Telephone Encounter (Signed)
Rx request sent to pharmacy.  

## 2015-11-16 NOTE — Telephone Encounter (Signed)
Please review for refill, Thank you.

## 2015-11-16 NOTE — Telephone Encounter (Signed)
Last OV 05/11/2015 Last refill 10/12/15 #240, 0rf No pending appt  Please advise

## 2015-11-16 NOTE — Telephone Encounter (Signed)
Refill OK with one additional refill.

## 2015-11-17 ENCOUNTER — Other Ambulatory Visit: Payer: Self-pay

## 2015-11-17 MED ORDER — OMEPRAZOLE 20 MG PO CPDR: 20.0000 mg | DELAYED_RELEASE_CAPSULE | Freq: Two times a day (BID) | ORAL | Status: DC

## 2015-11-23 ENCOUNTER — Other Ambulatory Visit: Payer: Self-pay | Admitting: Family Medicine

## 2015-12-14 ENCOUNTER — Other Ambulatory Visit: Payer: Self-pay | Admitting: Gastroenterology

## 2016-01-04 ENCOUNTER — Other Ambulatory Visit: Payer: Self-pay | Admitting: Gastroenterology

## 2016-01-13 ENCOUNTER — Other Ambulatory Visit: Payer: Self-pay | Admitting: Family Medicine

## 2016-01-13 NOTE — Telephone Encounter (Signed)
She needs office follow up.  May refill #120 but NO further refills until office f/u.

## 2016-01-14 NOTE — Telephone Encounter (Signed)
Rx phoned in.   

## 2016-01-24 ENCOUNTER — Other Ambulatory Visit: Payer: Self-pay | Admitting: Family Medicine

## 2016-01-26 ENCOUNTER — Other Ambulatory Visit: Payer: Self-pay | Admitting: Family Medicine

## 2016-01-27 NOTE — Telephone Encounter (Signed)
Rx refill sent to pharmacy. 

## 2016-01-29 ENCOUNTER — Other Ambulatory Visit: Payer: Self-pay

## 2016-01-29 MED ORDER — LOSARTAN POTASSIUM 50 MG PO TABS: 50.0000 mg | ORAL_TABLET | Freq: Every day | ORAL | 2 refills | Status: DC

## 2016-02-07 ENCOUNTER — Other Ambulatory Visit: Payer: Self-pay | Admitting: Gastroenterology

## 2016-02-17 ENCOUNTER — Other Ambulatory Visit: Payer: Self-pay | Admitting: Family Medicine

## 2016-02-22 ENCOUNTER — Telehealth: Payer: Self-pay | Admitting: Gastroenterology

## 2016-02-22 MED ORDER — OMEPRAZOLE 20 MG PO CPDR: DELAYED_RELEASE_CAPSULE | ORAL | 1 refills | Status: DC

## 2016-02-22 NOTE — Telephone Encounter (Signed)
Prescription sent to patient's pharmacy and patient notified to keep appt for further refills.

## 2016-02-24 ENCOUNTER — Other Ambulatory Visit: Payer: Self-pay | Admitting: Family Medicine

## 2016-02-24 MED ORDER — CHLORTHALIDONE 25 MG PO TABS: 25.0000 mg | ORAL_TABLET | Freq: Every day | ORAL | 0 refills | Status: DC

## 2016-02-26 ENCOUNTER — Other Ambulatory Visit: Payer: Self-pay | Admitting: Family Medicine

## 2016-02-26 NOTE — Telephone Encounter (Signed)
Rx request Tramadol 50 mg Last refilled 01/14/16 Last OV 12/29/2014 Please advise

## 2016-02-28 NOTE — Telephone Encounter (Signed)
Needs office follow up.  May refill #120 only with no additional refills.

## 2016-02-29 ENCOUNTER — Encounter: Payer: Self-pay | Admitting: Family Medicine

## 2016-02-29 ENCOUNTER — Ambulatory Visit (INDEPENDENT_AMBULATORY_CARE_PROVIDER_SITE_OTHER): Admitting: Family Medicine

## 2016-02-29 VITALS — BP 106/70 | HR 83 | Temp 98.5°F | Resp 20 | Ht 63.0 in | Wt 150.0 lb

## 2016-02-29 DIAGNOSIS — I10 Essential (primary) hypertension: Secondary | ICD-10-CM

## 2016-02-29 DIAGNOSIS — K219 Gastro-esophageal reflux disease without esophagitis: Secondary | ICD-10-CM

## 2016-02-29 DIAGNOSIS — R739 Hyperglycemia, unspecified: Secondary | ICD-10-CM | POA: Diagnosis not present

## 2016-02-29 DIAGNOSIS — E038 Other specified hypothyroidism: Secondary | ICD-10-CM

## 2016-02-29 DIAGNOSIS — E785 Hyperlipidemia, unspecified: Secondary | ICD-10-CM

## 2016-02-29 DIAGNOSIS — M797 Fibromyalgia: Secondary | ICD-10-CM | POA: Diagnosis not present

## 2016-02-29 LAB — LIPID PANEL
Cholesterol: 225 mg/dL — ABNORMAL HIGH (ref 0–200)
HDL: 54 mg/dL (ref >39.00)
LDL Cholesterol: 142 mg/dL — ABNORMAL HIGH (ref 0–99)
NonHDL: 171.2
Total CHOL/HDL Ratio: 4
Triglycerides: 144 mg/dL (ref 0.0–149.0)
VLDL: 28.8 mg/dL (ref 0.0–40.0)

## 2016-02-29 LAB — HEPATIC FUNCTION PANEL
ALT: 26 U/L (ref 0–35)
AST: 34 U/L (ref 0–37)
Albumin: 4.4 g/dL (ref 3.5–5.2)
Alkaline Phosphatase: 28 U/L — ABNORMAL LOW (ref 39–117)
Bilirubin, Direct: 0.1 mg/dL (ref 0.0–0.3)
Total Bilirubin: 0.3 mg/dL (ref 0.2–1.2)
Total Protein: 7.2 g/dL (ref 6.0–8.3)

## 2016-02-29 LAB — BASIC METABOLIC PANEL
BUN: 14 mg/dL (ref 6–23)
CO2: 30 mEq/L (ref 19–32)
Calcium: 10.5 mg/dL (ref 8.4–10.5)
Chloride: 101 mEq/L (ref 96–112)
Creatinine, Ser: 0.75 mg/dL (ref 0.40–1.20)
GFR: 83.72 mL/min (ref >60.00)
Glucose, Bld: 92 mg/dL (ref 70–99)
Potassium: 3.8 mEq/L (ref 3.5–5.1)
Sodium: 140 mEq/L (ref 135–145)

## 2016-02-29 LAB — HEMOGLOBIN A1C: Hgb A1c MFr Bld: 5.8 % (ref 4.6–6.5)

## 2016-02-29 LAB — TSH: TSH: 0.96 u[IU]/mL (ref 0.35–4.50)

## 2016-02-29 MED ORDER — TRAMADOL HCL 50 MG PO TABS: 50.0000 mg | ORAL_TABLET | Freq: Four times a day (QID) | ORAL | 5 refills | Status: DC | PRN

## 2016-02-29 NOTE — Progress Notes (Signed)
Subjective:     Patient ID: Kimberly Stein, female   DOB: 1955-06-29, 60 y.o.   MRN: [id number]  HPI Patient seen for medical follow-up. She has multiple chronic problems Including history of hyperlipidemia, hypertension, hypothyroidism, ongoing nicotine use, Mnire's disease, fibromyalgia, celiac disease, chronic cervicalgia. She's been on tramadol for several years and has recently tapered this down to total 4 tablets daily. Had been taking up to 8 tablets daily. She's had multiple surgeries during past year including surgery on her shoulder  She takes chlorthalidone for Mnire's disease and takes regular potassium supplement. She has hypothyroidism on levothyroxin and is overdue for TSH. She has hypertension treated with losartan. Denies any recent headaches or dizziness. No chest pains.  Patient also states she's had some mild hyperglycemia during the past year and requesting hemoglobin A1c. No polyuria or polydipsia  Past Medical History:  Diagnosis Date  . Allergy    sinus infections  . Anemia   . Arthritis   . Cancer ([place])    skin  . Celiac disease   . GERD (gastroesophageal reflux disease)   . Heart murmur   . Meniere's disease   . Neuromuscular disorder (HCC)    fibromyalgia  . Thyroid disease    hypothyroid  . 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 CARDIAC CATHETERIZATION N/A 02/25/2015   Procedure: Left Heart Cath and Coronary Angiography;  Surgeon: Peter M Martinique, MD;  Location: [facility];  Service: Cardiovascular;  Laterality: N/A;  . CERVICAL DISC SURGERY    . CHOLECYSTECTOMY    . COLONOSCOPY    . DILATION AND CURETTAGE OF UTERUS    . MOUTH SURGERY  2016  . TOTAL ABDOMINAL HYSTERECTOMY    . UPPER GASTROINTESTINAL ENDOSCOPY      reports that she has been smoking Cigarettes.  She has been smoking about 1.00 pack per day. She has never used smokeless tobacco. She reports that she does not drink alcohol  or use drugs. family history includes Celiac disease in her mother; Diabetes in her mother; Heart disease in her mother; Prostate cancer in her father. Allergies  Allergen Reactions  . Crestor [Rosuvastatin] Other (See Comments)    Bilateral Leg pain  . Vicodin [Hydrocodone-Acetaminophen] Itching     Review of Systems  Constitutional: Negative for appetite change, fatigue and unexpected weight change.  Eyes: Negative for visual disturbance.  Respiratory: Negative for cough, chest tightness, shortness of breath and wheezing.   Cardiovascular: Negative for chest pain, palpitations and leg swelling.  Musculoskeletal: Positive for arthralgias, back pain, myalgias and neck pain.  Neurological: Negative for dizziness, seizures, syncope, weakness, light-headedness and headaches.  Psychiatric/Behavioral: The patient is nervous/anxious.        Objective:   Physical Exam  Constitutional: She appears well-developed and well-nourished.  Neck: Neck supple. No thyromegaly present.  Cardiovascular: Normal rate and regular rhythm.   Pulmonary/Chest: Effort normal and breath sounds normal. No respiratory distress. She has no wheezes. She has no rales.  Musculoskeletal: She exhibits no edema.  Lymphadenopathy:    She has no cervical adenopathy.       Assessment:     #1 hypertension stable and at goal  #2 history of Mnire's disease on chronic diuretic with chlorthalidone  #3 hypothyroidism  #4 history of fibromyalgia  #5 history of chronic cervicalgia  #6 ongoing nicotine use    Plan:     -Flu vaccine recommended and patient declined -Check labs with  TSH, basic metabolic panel, lipid panel, hepatic panel -Refill tramadol # 120 tablets with 5 refills 1-2 every 6 hours as needed for pain -Strongly encouraged to stop smoking but currently motivation is low  Eulas Post MD [facility] at [facility]

## 2016-02-29 NOTE — Progress Notes (Signed)
Pre visit review using our clinic review tool, if applicable. No additional management support is needed unless otherwise documented below in the visit note. 

## 2016-03-03 NOTE — Telephone Encounter (Signed)
Please handle this thanks 

## 2016-03-09 ENCOUNTER — Other Ambulatory Visit: Payer: Self-pay | Admitting: Family Medicine

## 2016-03-24 ENCOUNTER — Other Ambulatory Visit: Payer: Self-pay | Admitting: Family Medicine

## 2016-04-08 ENCOUNTER — Encounter: Payer: Self-pay | Admitting: Cardiovascular Disease

## 2016-04-08 ENCOUNTER — Ambulatory Visit (INDEPENDENT_AMBULATORY_CARE_PROVIDER_SITE_OTHER): Admitting: Cardiovascular Disease

## 2016-04-08 VITALS — BP 127/81 | HR 54 | Ht 63.0 in | Wt 150.0 lb

## 2016-04-08 DIAGNOSIS — Z01818 Encounter for other preprocedural examination: Secondary | ICD-10-CM

## 2016-04-08 DIAGNOSIS — E7800 Pure hypercholesterolemia, unspecified: Secondary | ICD-10-CM

## 2016-04-08 DIAGNOSIS — E781 Pure hyperglyceridemia: Secondary | ICD-10-CM | POA: Diagnosis not present

## 2016-04-08 DIAGNOSIS — Z720 Tobacco use: Secondary | ICD-10-CM

## 2016-04-08 DIAGNOSIS — I10 Essential (primary) hypertension: Secondary | ICD-10-CM

## 2016-04-08 NOTE — Progress Notes (Signed)
Cardiology Office Note   Date:  02/13/15  ID:  Kimberly Stein, DOB Jun 07, 1955, MRN [id number]  PCP:  Eulas Post, MD  Cardiologist:   Skeet Latch, MD   Chief Complaint  Patient presents with  . Follow-up    History of Present Illness: Kimberly Stein is a 60 y.o. female with hypertension, hyperlipidemia and tobacco abuse who presents for follow.  [name] was seen on 9/23 after she had an abnormal stress test.  She underwent LHC on 02/25/15, which revealed no coronary artery disease.  She followed up with Krisitin Alvstad 04/2015 due to myalgias on atorvastatin.  Given her lack of ASCVD she was not a candidate for PCSK9 inhibitors.  However, she was willing to retry rosuvastatin.  She developed myalgias and had to stop it.  Her triglycerides were over 700 so she was started on fenofibrate.  Lipids were checked last month and they had improved to 144.  She has also been trying to get more exercise.  She walks her dog most days of the week and runs 1-2 times per week.  She has no chest pain or shortness of breath with this activity. She is also working on her diet. She stopped eating late in the evening and has significantly reduced the carbohydrates in her diet. She continues to smoke records and Per day. She does not think that she is ready to quit at this time.   Past Medical History:  Diagnosis Date  . Allergy    sinus infections  . Anemia   . Arthritis   . Cancer ([place])    skin  . Celiac disease   . GERD (gastroesophageal reflux disease)   . Heart murmur   . Meniere's disease   . Neuromuscular disorder (HCC)    fibromyalgia  . Thyroid disease    hypothyroid  . 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 CARDIAC CATHETERIZATION N/A 02/25/2015   Procedure: Left Heart Cath and Coronary Angiography;  Surgeon: Peter M Martinique, MD;  Location: [facility];  Service: Cardiovascular;  Laterality: N/A;  .  CERVICAL DISC SURGERY    . CHOLECYSTECTOMY    . COLONOSCOPY    . DILATION AND CURETTAGE OF UTERUS    . MOUTH SURGERY  2016  . TOTAL ABDOMINAL HYSTERECTOMY    . UPPER GASTROINTESTINAL ENDOSCOPY       Current Outpatient Prescriptions  Medication Sig Dispense Refill  . chlorthalidone (HYGROTON) 25 MG tablet TAKE 1 TABLET BY MOUTH DAILY 30 tablet 2  . cholecalciferol (VITAMIN D) 1000 units tablet Take 1,000 Units by mouth daily.    . cyclobenzaprine (FLEXERIL) 10 MG tablet Take 10 mg by mouth at bedtime.     [name] estradiol (ESTRACE) 1 MG tablet TAKE 1 TABLET BY MOUTH EVERY DAY 30 tablet 5  . fenofibrate (TRICOR) 145 MG tablet TAKE 1 TABLET BY MOUTH DAILY WITH FOOD 30 tablet 4  . gabapentin (NEURONTIN) 300 MG capsule Take 600 mg by mouth every evening.     [name] levothyroxine (SYNTHROID, LEVOTHROID) 150 MCG tablet TAKE 1 TABLET BY MOUTH EVERY DAY BEFORE BREAKFAST. NEEDS APPT 30 tablet 5  . losartan (COZAAR) 50 MG tablet Take 1 tablet (50 mg total) by mouth daily. 90 tablet 2  . omeprazole (PRILOSEC) 20 MG capsule TAKE ONE CAPSULE BY MOUTH TWICE DAILY BEFORE A MEAL 60 capsule 1  . potassium chloride (K-DUR,KLOR-CON) 10 MEQ tablet TAKE 1 TABLET(10  MEQ) BY MOUTH DAILY 90 tablet 1  . PROAIR HFA 108 (90 Base) MCG/ACT inhaler INHALE 2 PUFFS INTO LUNGS TWICE DAILY 8.5 g 0  . traMADol (ULTRAM) 50 MG tablet Take 1 tablet (50 mg total) by mouth every 6 (six) hours as needed. 120 tablet 5  . VOLTAREN 1 % GEL Apply 2 g topically 3 (three) times daily. Place on area 3 times daily. Per Neurologist  3   No current facility-administered medications for this visit.     Allergies:   Crestor [rosuvastatin] and Vicodin [hydrocodone-acetaminophen]    Social History:  The patient  reports that she has been smoking Cigarettes.  She has been smoking about 1.00 pack per day. She has never used smokeless tobacco. She reports that she does not drink alcohol or use drugs.   Family History:  The patient's family history  includes Celiac disease in her mother; Diabetes in her mother; Heart disease in her mother; Prostate cancer in her father.    ROS:  Please see the history of present illness.   Otherwise, review of systems are positive for back and leg pain.   All other systems are reviewed and negative.    PHYSICAL EXAM: VS:  BP 127/81   Pulse (!) 54   Ht 5' 3"  (1.6 m)   Wt 68 kg (150 lb)   BMI 26.57 kg/m  , BMI Body mass index is 26.57 kg/m. GENERAL:  Well appearing HEENT:  Pupils equal round and reactive, fundi not visualized, oral mucosa unremarkable NECK:  No jugular venous distention, waveform within normal limits, carotid upstroke brisk and symmetric, no bruits LYMPHATICS:  No cervical adenopathy LUNGS:  Clear to auscultation bilaterally HEART:  RRR.  PMI not displaced or sustained,S1 and S2 within normal limits, no S3, no S4, no clicks, no rubs, no murmurs ABD:  Flat, positive bowel sounds normal in frequency in pitch, no bruits, no rebound, no guarding, no midline pulsatile mass, no hepatomegaly, no splenomegaly EXT:  2 plus pulses throughout, no edema, no cyanosis no clubbing SKIN:  No rashes no nodules NEURO:  Cranial nerves II through XII grossly intact, motor grossly intact throughout PSYCH:  Cognitively intact, oriented to person place and time    EKG:  EKG is ordered today. 04/08/16: Sinus bradycardia. Rate 54 bpm.  LHC 02/25/15: Dominance: Right   Left Main  The vessel was injected is normal in caliber is angiographically normal.     Left Anterior Descending  The vessel was injected is normal in caliber . The vessel is tortuous. Minimal calcification in the proximal vessel.     Left Circumflex  The vessel was injected is normal in caliber is angiographically normal.     Right Coronary Artery  The vessel was injected is normal in caliber is angiographically normal. Minimal calcification in the proximal vessel.       Recent Labs: 02/29/2016: ALT 26; BUN 14; Creatinine,  Ser 0.75; Potassium 3.8; Sodium 140; TSH 0.96    Lipid Panel    Component Value Date/Time   CHOL 225 (H) 02/29/2016 1021   TRIG 144.0 02/29/2016 1021   HDL 54.00 02/29/2016 1021   CHOLHDL 4 02/29/2016 1021   VLDL 28.8 02/29/2016 1021   LDLCALC 142 (H) 02/29/2016 1021   LDLDIRECT 168 (H) 02/19/2015 0755      Wt Readings from Last 3 Encounters:  04/08/16 68 kg (150 lb)  02/29/16 68 kg (150 lb)  05/12/15 67 kg (147 lb 11.2 oz)  Other studies Reviewed: Additional studies/ records that were reviewed today include: . Review of the above records demonstrates:  Please see elsewhere in the note.     ASSESSMENT AND PLAN:  # Hypertension: BP well-controlled.  We will stop metoprolol and resume her prior dose of chlorthalidone and losartan as above.  Continue chlorthalidone and losartan.   # Tobacco abuse: Ms. Petrak was encouraged to quit smoking. She is not ready at this time.  # Hyperlipidemia: Ms. Barona did not tolerate atorvastatin or rosuvastatin due to myalgias.  She had significant improvement in her triglycerides with fenofibrate. We discussed starting Saturday and she is not interested at this time. I encouraged her to continue with her dietary and exercise changes.   Current medicines are reviewed at length with the patient today.  The patient does not have concerns regarding medicines.  The following changes have been made:  none Labs/ tests ordered today include:   No orders of the defined types were placed in this encounter.    Disposition:   FU with Pilar Westergaard C. Oval Linsey, MD as needed.   Signed, Skeet Latch, MD  04/08/2016 9:59 AM    [place]

## 2016-04-08 NOTE — Patient Instructions (Signed)
Medication Instructions:  Your physician recommends that you continue on your current medications as directed. Please refer to the Current Medication list given to you today.  Labwork: none  Testing/Procedures: none  Follow-Up: As needed

## 2016-04-17 ENCOUNTER — Other Ambulatory Visit: Payer: Self-pay | Admitting: Cardiovascular Disease

## 2016-04-18 ENCOUNTER — Ambulatory Visit (INDEPENDENT_AMBULATORY_CARE_PROVIDER_SITE_OTHER): Admitting: Gastroenterology

## 2016-04-18 ENCOUNTER — Encounter: Payer: Self-pay | Admitting: Gastroenterology

## 2016-04-18 ENCOUNTER — Other Ambulatory Visit (INDEPENDENT_AMBULATORY_CARE_PROVIDER_SITE_OTHER)

## 2016-04-18 VITALS — BP 110/66 | HR 68 | Ht 63.0 in | Wt 149.0 lb

## 2016-04-18 DIAGNOSIS — Z86010 Personal history of colonic polyps: Secondary | ICD-10-CM | POA: Diagnosis not present

## 2016-04-18 DIAGNOSIS — K900 Celiac disease: Secondary | ICD-10-CM | POA: Diagnosis not present

## 2016-04-18 DIAGNOSIS — Z860101 Personal history of adenomatous and serrated colon polyps: Secondary | ICD-10-CM

## 2016-04-18 DIAGNOSIS — K219 Gastro-esophageal reflux disease without esophagitis: Secondary | ICD-10-CM

## 2016-04-18 LAB — IGA: IgA: 157 mg/dL (ref 68–378)

## 2016-04-18 MED ORDER — OMEPRAZOLE 20 MG PO CPDR: DELAYED_RELEASE_CAPSULE | ORAL | 11 refills | Status: DC

## 2016-04-18 MED ORDER — NA SULFATE-K SULFATE-MG SULF 17.5-3.13-1.6 GM/177ML PO SOLN: 1.0000 | Freq: Once | ORAL | 0 refills | Status: AC

## 2016-04-18 NOTE — Telephone Encounter (Signed)
REFILL 

## 2016-04-18 NOTE — Patient Instructions (Signed)
Your physician has requested that you go to the basement for the following lab work before leaving today: TTG,IGA.  We have sent the following medications to your pharmacy for you to pick up at your convenience: omeprazole.  You have been scheduled for a colonoscopy. Please follow written instructions given to you at your visit today.  Please pick up your prep supplies at the pharmacy within the next 1-3 days. If you use inhalers (even only as needed), please bring them with you on the day of your procedure. Your physician has requested that you go to www.startemmi.com and enter the access code given to you at your visit today. This web site gives a general overview about your procedure. However, you should still follow specific instructions given to you by our office regarding your preparation for the procedure.  Thank you for choosing me and [facility].  Pricilla Riffle. Dagoberto Ligas., MD., [facility]

## 2016-04-18 NOTE — Progress Notes (Signed)
    History of Present Illness: This is a 60 year old female returning for follow-up of celiac disease, GERD and tubulovillous adenomatous colon polyps. She claims ongoing complete compliance with her gluten-free diet. Her reflux symptoms are well-controlled on her current regimen. She has no gastrointestinal complaints. She is 2 years overdue for surveillance colonoscopy. Denies weight loss, abdominal pain, constipation, diarrhea, change in stool caliber, melena, hematochezia, nausea, vomiting, dysphagia, reflux symptoms, chest pain.  Current Medications, Allergies, Past Medical History, Past Surgical History, Family History and Social History were reviewed in [employer] record.  Physical Exam: General: Well developed, well nourished, no acute distress Head: Normocephalic and atraumatic Eyes:  sclerae anicteric, EOMI Ears: Normal auditory acuity Mouth: No deformity or lesions Lungs: Clear throughout to auscultation Heart: Regular rate and rhythm; no murmurs, rubs or bruits Abdomen: Soft, non tender and non distended. No masses, hepatosplenomegaly or hernias noted. Normal Bowel sounds Rectal: deferred to colonoscopy Musculoskeletal: Symmetrical with no gross deformities  Pulses:  Normal pulses noted Extremities: No clubbing, cyanosis, edema or deformities noted Neurological: Alert oriented x 4, grossly nonfocal Psychological:  Alert and cooperative. Normal mood and affect  Assessment and Recommendations:  1. Celiac disease. Check tTG and IgA. Continue gluten free diet, life long.   2. GERD. Standard antireflux measures and continue omeprazole 20 mg twice a day.  3. Personal history of tubulovillous adenomatous colon polyps, overdue for surveillance colonoscopy. Schedule colonoscopy. The risks (including bleeding, perforation, infection, missed lesions, medication reactions and possible hospitalization or surgery if complications occur), benefits, and alternatives  to colonoscopy with possible biopsy and possible polypectomy were discussed with the patient and they consent to proceed.

## 2016-04-18 NOTE — Telephone Encounter (Signed)
Please review for refill. Thanks!  

## 2016-04-19 ENCOUNTER — Other Ambulatory Visit: Payer: Self-pay | Admitting: Family Medicine

## 2016-04-19 LAB — TISSUE TRANSGLUTAMINASE, IGA: Tissue Transglutaminase Ab, IgA: 1 U/mL (ref <4)

## 2016-04-20 ENCOUNTER — Other Ambulatory Visit: Payer: Self-pay | Admitting: Gastroenterology

## 2016-04-24 ENCOUNTER — Other Ambulatory Visit: Payer: Self-pay | Admitting: Family Medicine

## 2016-05-19 ENCOUNTER — Other Ambulatory Visit: Payer: Self-pay | Admitting: Family Medicine

## 2016-06-01 ENCOUNTER — Other Ambulatory Visit: Payer: Self-pay | Admitting: Rheumatology

## 2016-06-01 NOTE — Telephone Encounter (Signed)
Last Visit: 12/22/15 Next Visit: 06/14/16 Labs: 12/23/15 Elevated Glucose  Okay to refill Flexeril?

## 2016-06-13 DIAGNOSIS — M67911 Unspecified disorder of synovium and tendon, right shoulder: Secondary | ICD-10-CM | POA: Insufficient documentation

## 2016-06-13 DIAGNOSIS — M47812 Spondylosis without myelopathy or radiculopathy, cervical region: Secondary | ICD-10-CM | POA: Insufficient documentation

## 2016-06-13 DIAGNOSIS — F5101 Primary insomnia: Secondary | ICD-10-CM | POA: Insufficient documentation

## 2016-06-13 DIAGNOSIS — M7062 Trochanteric bursitis, left hip: Secondary | ICD-10-CM

## 2016-06-13 DIAGNOSIS — M19041 Primary osteoarthritis, right hand: Secondary | ICD-10-CM | POA: Insufficient documentation

## 2016-06-13 DIAGNOSIS — M47816 Spondylosis without myelopathy or radiculopathy, lumbar region: Secondary | ICD-10-CM | POA: Insufficient documentation

## 2016-06-13 DIAGNOSIS — M19042 Primary osteoarthritis, left hand: Secondary | ICD-10-CM | POA: Insufficient documentation

## 2016-06-13 DIAGNOSIS — R5383 Other fatigue: Secondary | ICD-10-CM | POA: Insufficient documentation

## 2016-06-13 DIAGNOSIS — M7061 Trochanteric bursitis, right hip: Secondary | ICD-10-CM | POA: Insufficient documentation

## 2016-06-13 NOTE — Progress Notes (Signed)
Office Visit Note  Patient: Kimberly Stein             Date of Birth: June 14, 1955           MRN: [id number]             PCP: Eulas Post, MD Referring: Eulas Post, MD Visit Date: 06/14/2016 Occupation: @GUAROCC @    Subjective: Lower back pain.   History of Present Illness: Kimberly Stein is a 61 y.o. female with history of fibromyalgia syndrome osteoarthritis and disc disease. She's been having a lot of discomfort in her lower back. She gets nerve ablation to for her lower back every 6 months. Her last ablation was done about a month ago. An she's a still recovering from that. He did recover from her left rotator cuff tear repair very well and did not have much problems with that. Of fibromyalgia she describes is not as severe at this point. She describes pain on scale of 0-10 about 2-3 and fatigue about 3 as well. Neck pain is tolerable. Her hands are doing quite well as well. The trochanteric bursitis is not been as bothersome.  Activities of Daily Living:  Patient reports morning stiffness for 2 hours.   Patient Reports nocturnal pain. In her lower back and trochanteric area. Difficulty dressing/grooming: Denies Difficulty climbing stairs: Reports Difficulty getting out of chair: Denies Difficulty using hands for taps, buttons, cutlery, and/or writing: Denies   Review of Systems  Constitutional: Positive for fatigue. Negative for night sweats, weight gain, weight loss and weakness.  HENT: Positive for mouth dryness. Negative for mouth sores, trouble swallowing, trouble swallowing and nose dryness.   Eyes: Negative for pain, redness, visual disturbance and dryness.  Respiratory: Negative for cough, shortness of breath and difficulty breathing.   Cardiovascular: Negative for chest pain, palpitations, hypertension, irregular heartbeat and swelling in legs/feet.  Gastrointestinal: Negative for blood in stool, constipation and diarrhea.  Endocrine: Negative for  increased urination.  Genitourinary: Negative for vaginal dryness.  Musculoskeletal: Positive for arthralgias, joint pain, myalgias, morning stiffness and myalgias. Negative for joint swelling, muscle weakness and muscle tenderness.  Skin: Negative for color change, rash, hair loss, skin tightness, ulcers and sensitivity to sunlight.  Allergic/Immunologic: Negative for susceptible to infections.  Neurological: Negative for dizziness, memory loss and night sweats.  Hematological: Negative for swollen glands.  Psychiatric/Behavioral: Positive for sleep disturbance. Negative for depressed mood. The patient is not nervous/anxious.     PMFS History:  Patient Active Problem List   Diagnosis Date Noted  . Other fatigue 06/13/2016  . Primary insomnia 06/13/2016  . Primary osteoarthritis of both hands 06/13/2016  . Trochanteric bursitis of both hips 06/13/2016  . DDD cervical spine status post fusion 06/13/2016  . Osteoarthritis of lumbar spine 06/13/2016  . Tendinopathy of right rotator cuff 06/13/2016  . Chest pain 02/25/2015  . Abnormal nuclear stress test 02/25/2015  . Hyperlipidemia 07/17/2013  . Osteopenia 01/28/2012  . 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 CELIAC SPRUE 09/29/2009  . Hypothyroidism 09/07/2009  . Meniere's disease 09/07/2009  . Essential hypertension 09/07/2009  . RUQ PAIN 09/07/2009  . Fibromyalgia 06/12/2009  . CERVICALGIA 06/03/2009  . ANEMIA-IRON DEFICIENCY 10/21/2008  . GERD 10/21/2008  . DIZZINESS 10/21/2008  . COLONIC POLYPS, HX OF 10/21/2008    Past Medical History:  Diagnosis Date  . Allergy    sinus infections  . Anemia   . Arthritis   . Cancer ([place])    skin  . Celiac disease   .  GERD (gastroesophageal reflux disease)   . Heart murmur   . Meniere's disease   . Neuromuscular disorder (HCC)    fibromyalgia  . Thyroid disease    hypothyroid  . 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 Celiac disease  Mother   . Diabetes Mother   . Heart disease Mother   . Prostate cancer Father   . Colon cancer Neg Hx   . Esophageal cancer Neg Hx   . Stomach cancer Neg Hx    Past Surgical History:  Procedure Laterality Date  . CARDIAC CATHETERIZATION N/A 02/25/2015   Procedure: Left Heart Cath and Coronary Angiography;  Surgeon: Peter M Martinique, MD;  Location: [facility];  Service: Cardiovascular;  Laterality: N/A;  . CERVICAL DISC SURGERY    . CHOLECYSTECTOMY    . COLONOSCOPY    . DILATION AND CURETTAGE OF UTERUS    . MOUTH SURGERY  2016  . TOTAL ABDOMINAL HYSTERECTOMY    . UPPER GASTROINTESTINAL ENDOSCOPY     Social History   Social History Narrative  . No narrative on file     Objective: Vital Signs: There were no vitals taken for this visit.   Physical Exam  Constitutional: She is oriented to person, place, and time. She appears well-developed and well-nourished.  HENT:  Head: Normocephalic and atraumatic.  Eyes: Conjunctivae and EOM are normal.  Neck: Normal range of motion.  Cardiovascular: Normal rate, regular rhythm, normal heart sounds and intact distal pulses.   Pulmonary/Chest: Effort normal and breath sounds normal.  Abdominal: Soft. Bowel sounds are normal.  Lymphadenopathy:    She has no cervical adenopathy.  Neurological: She is alert and oriented to person, place, and time.  Skin: Skin is warm and dry. Capillary refill takes less than 2 seconds.  Psychiatric: She has a normal mood and affect. Her behavior is normal.  Nursing note and vitals reviewed.    Musculoskeletal Exam: C-spine limited range of motion, lumbar spine limited painful range of motion. Shoulder joints elbow joints wrist joints are good range of motion she is some thickening of PIP/DIP joints in her hands consistent with osteoarthritis with no synovitis hip joints knee joints ankles MTPs PIPs DIPs are good range of motion with no synovitis she is tenderness on palpation over bilateral  trochanteric bursa area consistent with trochanteric bursitis fibromyalgia tender points are 16 out of 18 positive. She also had generalized hyperalgesia.  CDAI Exam: No CDAI exam completed.    Investigation: Findings:  12/22/2015 CBC normal CMP normal glucose 166, vitamin D 34    Imaging: No results found.  Speciality Comments: No specialty comments available.    Procedures:  No procedures performed Allergies: Crestor [rosuvastatin] and Vicodin [hydrocodone-acetaminophen]   Assessment / Plan:     Visit Diagnoses: Fibromyalgia: She is doing better in general with decreased pain and fatigue.  Other fatigue: Fatigue is better as she's been sleeping somewhat better.  Primary insomnia: The Flexeril has been helpful  Primary osteoarthritis of both hands: Joint protection and muscle strengthening was discussed.  Trochanteric bursitis of both hips: ITB and exercise were discussed. Osteopenia of multiple sites  DDD cervical spine status post fusion: She has limited range of motion with some discomfort Her other medical problems are listed as follows.   Tendinopathy of right rotator cuff  Celiac disease  Other specified hypothyroidism  Gastroesophageal reflux disease  - History of hiatal hernia  Meniere's disease, unspecified laterality  Orders: No orders of the defined types were placed in this encounter.  No orders of the defined types were placed in this encounter.   Face-to-face time spent with patient was 30 minutes. 50% of time was spent in counseling and coordination of care.  Follow-Up Instructions: No Follow-up on file.   Bo Merino, MD  Note - This record has been created using Editor, commissioning.  Chart creation errors have been sought, but may not always  have been located. Such creation errors do not reflect on  the standard of medical care.

## 2016-06-14 ENCOUNTER — Encounter: Payer: Self-pay | Admitting: Rheumatology

## 2016-06-14 ENCOUNTER — Ambulatory Visit (INDEPENDENT_AMBULATORY_CARE_PROVIDER_SITE_OTHER): Admitting: Rheumatology

## 2016-06-14 VITALS — BP 120/70 | HR 74 | Resp 14 | Ht 63.0 in | Wt 151.0 lb

## 2016-06-14 DIAGNOSIS — M797 Fibromyalgia: Secondary | ICD-10-CM | POA: Diagnosis not present

## 2016-06-14 DIAGNOSIS — M7061 Trochanteric bursitis, right hip: Secondary | ICD-10-CM

## 2016-06-14 DIAGNOSIS — K219 Gastro-esophageal reflux disease without esophagitis: Secondary | ICD-10-CM

## 2016-06-14 DIAGNOSIS — E038 Other specified hypothyroidism: Secondary | ICD-10-CM

## 2016-06-14 DIAGNOSIS — M19041 Primary osteoarthritis, right hand: Secondary | ICD-10-CM | POA: Diagnosis not present

## 2016-06-14 DIAGNOSIS — M67911 Unspecified disorder of synovium and tendon, right shoulder: Secondary | ICD-10-CM

## 2016-06-14 DIAGNOSIS — F5101 Primary insomnia: Secondary | ICD-10-CM

## 2016-06-14 DIAGNOSIS — M8589 Other specified disorders of bone density and structure, multiple sites: Secondary | ICD-10-CM

## 2016-06-14 DIAGNOSIS — M47816 Spondylosis without myelopathy or radiculopathy, lumbar region: Secondary | ICD-10-CM

## 2016-06-14 DIAGNOSIS — M5030 Other cervical disc degeneration, unspecified cervical region: Secondary | ICD-10-CM

## 2016-06-14 DIAGNOSIS — M7062 Trochanteric bursitis, left hip: Secondary | ICD-10-CM

## 2016-06-14 DIAGNOSIS — M47812 Spondylosis without myelopathy or radiculopathy, cervical region: Secondary | ICD-10-CM

## 2016-06-14 DIAGNOSIS — K900 Celiac disease: Secondary | ICD-10-CM

## 2016-06-14 DIAGNOSIS — H8109 Meniere's disease, unspecified ear: Secondary | ICD-10-CM

## 2016-06-14 DIAGNOSIS — R5383 Other fatigue: Secondary | ICD-10-CM | POA: Diagnosis not present

## 2016-06-14 DIAGNOSIS — M19042 Primary osteoarthritis, left hand: Secondary | ICD-10-CM

## 2016-06-17 ENCOUNTER — Encounter: Payer: Self-pay | Admitting: Gastroenterology

## 2016-06-28 ENCOUNTER — Other Ambulatory Visit: Payer: Self-pay | Admitting: Family Medicine

## 2016-06-30 ENCOUNTER — Encounter: Admitting: Gastroenterology

## 2016-07-21 ENCOUNTER — Other Ambulatory Visit: Payer: Self-pay | Admitting: Family Medicine

## 2016-07-21 ENCOUNTER — Encounter: Payer: Self-pay | Admitting: Gastroenterology

## 2016-07-21 DIAGNOSIS — Z1231 Encounter for screening mammogram for malignant neoplasm of breast: Secondary | ICD-10-CM

## 2016-08-16 ENCOUNTER — Ambulatory Visit: Admitting: Family Medicine

## 2016-08-24 ENCOUNTER — Other Ambulatory Visit: Payer: Self-pay | Admitting: Family Medicine

## 2016-08-26 ENCOUNTER — Ambulatory Visit (INDEPENDENT_AMBULATORY_CARE_PROVIDER_SITE_OTHER): Admitting: Family Medicine

## 2016-08-26 ENCOUNTER — Encounter: Payer: Self-pay | Admitting: Family Medicine

## 2016-08-26 VITALS — BP 100/70 | HR 94 | Temp 98.3°F | Ht 63.0 in | Wt 150.4 lb

## 2016-08-26 DIAGNOSIS — M542 Cervicalgia: Secondary | ICD-10-CM

## 2016-08-26 DIAGNOSIS — M797 Fibromyalgia: Secondary | ICD-10-CM

## 2016-08-26 MED ORDER — TRAMADOL HCL 50 MG PO TABS: ORAL_TABLET | ORAL | 5 refills | Status: DC

## 2016-08-26 MED ORDER — TRAMADOL HCL 50 MG PO TABS: ORAL_TABLET | ORAL | 0 refills | Status: DC

## 2016-08-26 NOTE — Progress Notes (Signed)
Pre visit review using our clinic review tool, if applicable. No additional management support is needed unless otherwise documented below in the visit note. 

## 2016-08-26 NOTE — Progress Notes (Signed)
Subjective:     Patient ID: Kimberly Stein, female   DOB: [birth year], 61 y.o.   MRN: [id number]  HPI Patient has long-standing history of fibromyalgia and some chronic back pain. She was placed years ago tramadol but is resolute that she wishes to come off this and is currently taking one 3 times a day.  Takes Flexeril for her fibromyalgia. She plans to start walking more consistently. Currently her neck pain and fibromyalgia pains are stable. She is still followed by rheumatology.  Past Medical History:  Diagnosis Date  . Allergy    sinus infections  . Anemia   . Arthritis   . Cancer ([place])    skin  . Celiac disease   . GERD (gastroesophageal reflux disease)   . Heart murmur   . Meniere's disease   . Neuromuscular disorder (HCC)    fibromyalgia  . Thyroid disease    hypothyroid  . 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 CARDIAC CATHETERIZATION N/A 02/25/2015   Procedure: Left Heart Cath and Coronary Angiography;  Surgeon: Peter M Martinique, MD;  Location: [facility];  Service: Cardiovascular;  Laterality: N/A;  . CERVICAL DISC SURGERY    . CHOLECYSTECTOMY    . COLONOSCOPY    . DILATION AND CURETTAGE OF UTERUS    . MOUTH SURGERY  2016  . TOTAL ABDOMINAL HYSTERECTOMY    . UPPER GASTROINTESTINAL ENDOSCOPY      reports that she has been smoking Cigarettes.  She has been smoking about 1.00 pack per day. She has never used smokeless tobacco. She reports that she does not drink alcohol or use drugs. family history includes Celiac disease in her mother; Diabetes in her mother; Heart disease in her mother; Prostate cancer in her father. Allergies  Allergen Reactions  . Crestor [Rosuvastatin] Other (See Comments)    Bilateral Leg pain  . Nortriptyline Rash  . Vicodin [Hydrocodone-Acetaminophen] Itching     Review of Systems  Constitutional: Negative for fatigue.  Eyes: Negative for visual disturbance.  Respiratory:  Negative for cough, chest tightness, shortness of breath and wheezing.   Cardiovascular: Negative for chest pain, palpitations and leg swelling.  Neurological: Negative for dizziness, seizures, syncope, weakness, light-headedness and headaches.       Objective:   Physical Exam  Constitutional: She appears well-developed and well-nourished.  Eyes: Pupils are equal, round, and reactive to light.  Neck: Neck supple. No JVD present. No thyromegaly present.  Cardiovascular: Normal rate and regular rhythm.  Exam reveals no gallop.   Pulmonary/Chest: Effort normal and breath sounds normal. No respiratory distress. She has no wheezes. She has no rales.  Musculoskeletal: She exhibits no edema.  Neurological: She is alert.       Assessment:     #1 fibromyalgia  #2 chronic cervicalgia    Plan:     -We discussed tapering her tramadol to 1 twice daily for 2 weeks then once daily for 2 weeks and then discontinue -Schedule six-month follow-up and obtain lab work then -get active with exercise most days of the week.  Eulas Post MD [facility]

## 2016-08-29 ENCOUNTER — Other Ambulatory Visit: Payer: Self-pay | Admitting: Rheumatology

## 2016-08-29 NOTE — Telephone Encounter (Signed)
Last Visit: 06/14/16 Next Visit: 12/12/16  Okay to refill Flexeril?

## 2016-08-29 NOTE — Telephone Encounter (Signed)
ok 

## 2016-09-06 ENCOUNTER — Encounter

## 2016-09-09 ENCOUNTER — Ambulatory Visit (AMBULATORY_SURGERY_CENTER): Payer: Self-pay

## 2016-09-09 VITALS — Ht 63.0 in | Wt 150.0 lb

## 2016-09-09 DIAGNOSIS — Z860100 Personal history of colon polyps, unspecified: Secondary | ICD-10-CM

## 2016-09-09 NOTE — Progress Notes (Signed)
No allergies to eggs or soy No diet meds No home oxygen No past problems with anesthesia  Registered for emmi

## 2016-09-12 ENCOUNTER — Ambulatory Visit
Admission: RE | Admit: 2016-09-12 | Discharge: 2016-09-12 | Disposition: A | Discharge status: Home / Self Care | Source: Ambulatory Visit | Attending: Family Medicine | Admitting: Family Medicine

## 2016-09-12 DIAGNOSIS — Z1231 Encounter for screening mammogram for malignant neoplasm of breast: Secondary | ICD-10-CM

## 2016-09-20 ENCOUNTER — Encounter: Admitting: Gastroenterology

## 2016-10-28 ENCOUNTER — Other Ambulatory Visit: Payer: Self-pay | Admitting: Family Medicine

## 2016-10-29 ENCOUNTER — Other Ambulatory Visit: Payer: Self-pay | Admitting: Family Medicine

## 2016-11-17 ENCOUNTER — Encounter: Admitting: Gastroenterology

## 2016-11-21 ENCOUNTER — Other Ambulatory Visit: Payer: Self-pay | Admitting: Rheumatology

## 2016-11-21 ENCOUNTER — Other Ambulatory Visit: Payer: Self-pay | Admitting: Family Medicine

## 2016-11-21 NOTE — Telephone Encounter (Signed)
Last Visit: 06/14/16 Next Visit: 12/12/16  Okay to refill per Dr. Estanislado Pandy

## 2016-12-08 NOTE — Progress Notes (Signed)
Office Visit Note  Patient: Kimberly Stein             Date of Birth: January 20, 1956           MRN: [id number]             PCP: Eulas Post, MD Referring: Eulas Post, MD Visit Date: 12/12/2016 Occupation: @GUAROCC @    Subjective:  Muscle cramps   History of Present Illness: Kimberly Stein is a 61 y.o. female with history of fibromyalgia osteoarthritis and disc disease. She states she's been experiencing increased muscle cramps lately. She has also not noticed a knot on her left third finger. She continues to have some discomfort in her trochanteric area. She's been having some discomfort in her neck and lower back.  Activities of Daily Living:  Patient reports morning stiffness for 0 minute.   Patient Denies nocturnal pain.  Difficulty dressing/grooming: Denies Difficulty climbing stairs: Denies Difficulty getting out of chair: Denies Difficulty using hands for taps, buttons, cutlery, and/or writing: Denies   Review of Systems  Constitutional: Negative for fatigue, night sweats, weight gain, weight loss and weakness.  HENT: Negative for mouth sores, trouble swallowing, trouble swallowing, mouth dryness and nose dryness.   Eyes: Negative for pain, redness, visual disturbance and dryness.  Respiratory: Negative for cough, shortness of breath and difficulty breathing.   Cardiovascular: Negative for chest pain, palpitations, hypertension, irregular heartbeat and swelling in legs/feet.  Gastrointestinal: Negative for blood in stool, constipation and diarrhea.  Endocrine: Negative for increased urination.  Genitourinary: Negative for vaginal dryness.  Musculoskeletal: Positive for myalgias and myalgias. Negative for arthralgias, joint pain, joint swelling, muscle weakness, morning stiffness and muscle tenderness.  Skin: Negative for color change, rash, hair loss, skin tightness, ulcers and sensitivity to sunlight.  Allergic/Immunologic: Negative for susceptible to  infections.  Neurological: Negative for dizziness, memory loss and night sweats.  Hematological: Negative for swollen glands.  Psychiatric/Behavioral: Negative for depressed mood and sleep disturbance. The patient is not nervous/anxious.     PMFS History:  Patient Active Problem List   Diagnosis Date Noted  . Other fatigue 06/13/2016  . Primary insomnia 06/13/2016  . Primary osteoarthritis of both hands 06/13/2016  . Trochanteric bursitis of both hips 06/13/2016  . DDD cervical spine status post fusion 06/13/2016  . Osteoarthritis of lumbar spine 06/13/2016  . Tendinopathy of right rotator cuff 06/13/2016  . Chest pain 02/25/2015  . Abnormal nuclear stress test 02/25/2015  . Hyperlipidemia 07/17/2013  . Osteopenia 01/28/2012  . 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 CELIAC SPRUE 09/29/2009  . Hypothyroidism 09/07/2009  . Meniere's disease 09/07/2009  . Essential hypertension 09/07/2009  . RUQ PAIN 09/07/2009  . Fibromyalgia 06/12/2009  . CERVICALGIA 06/03/2009  . ANEMIA-IRON DEFICIENCY 10/21/2008  . GERD 10/21/2008  . DIZZINESS 10/21/2008  . COLONIC POLYPS, HX OF 10/21/2008    Past Medical History:  Diagnosis Date  . Allergy    sinus infections  . Anemia   . Arthritis   . Celiac disease   . GERD (gastroesophageal reflux disease)   . Heart murmur   . Hiatal hernia   . Hyperlipidemia   . Hypertension   . Meniere's disease   . Neuromuscular disorder (HCC)    fibromyalgia  . Thyroid disease    hypothyroid  . 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 Celiac disease Mother   . Diabetes Mother   . Heart disease Mother   .  Prostate cancer Father   . Breast cancer Maternal Aunt   . Diabetes Sister   . Meniere's disease Sister   . Celiac disease Sister   . Diabetes Sister   . Colon cancer Neg Hx   . Esophageal cancer Neg Hx   . Stomach cancer Neg Hx    Past Surgical History:  Procedure Laterality Date  . CARDIAC  CATHETERIZATION N/A 02/25/2015   Procedure: Left Heart Cath and Coronary Angiography;  Surgeon: Peter M Martinique, MD;  Location: [facility];  Service: Cardiovascular;  Laterality: N/A;  . CERVICAL DISC SURGERY    . CHOLECYSTECTOMY    . COLONOSCOPY    . DILATION AND CURETTAGE OF UTERUS    . HAND SURGERY     right hand; calcium deposit on wrist and under thumb and ganglion cyst and carpal tunnel  . lumbar radiofrequency nerve ablation     twice yearly  . MOUTH SURGERY  2016  . ROTATOR CUFF REPAIR  06/07/2015   left shoulder  . TOTAL ABDOMINAL HYSTERECTOMY    . UPPER GASTROINTESTINAL ENDOSCOPY     Social History   Social History Narrative  . No narrative on file     Objective: Vital Signs: BP 114/77 (BP Location: Right Arm, Patient Position: Sitting, Cuff Size: Normal)   Pulse 65   Ht 5' 3"  (1.6 m)   Wt 150 lb (68 kg)   BMI 26.57 kg/m    Physical Exam  Constitutional: She is oriented to person, place, and time. She appears well-developed and well-nourished.  HENT:  Head: Normocephalic and atraumatic.  Eyes: Conjunctivae and EOM are normal.  Neck: Normal range of motion.  Cardiovascular: Normal rate, regular rhythm, normal heart sounds and intact distal pulses.   Pulmonary/Chest: Effort normal and breath sounds normal.  Abdominal: Soft. Bowel sounds are normal.  Lymphadenopathy:    She has no cervical adenopathy.  Neurological: She is alert and oriented to person, place, and time.  Skin: Skin is warm and dry. Capillary refill takes less than 2 seconds.  Psychiatric: She has a normal mood and affect. Her behavior is normal.  Nursing note and vitals reviewed.    Musculoskeletal Exam: C-spine and thoracic lumbar spine good range of motion. Shoulder joints although joints wrist joints good range of motion. She is some discomfort range of motion of her right shoulder. Elbow joints wrist joints are good range of motion. She has some DIP PIP thickening in her hands  consistent with osteoarthritis. Hip joints knee joints ankles MTPs PIPs DIPs with good range of motion with no synovitis. Fibromyalgia tender points were 6 out of 18 positive.  CDAI Exam: No CDAI exam completed.    Investigation: No additional findings.   Imaging: No results found.  Speciality Comments: No specialty comments available.    Procedures:  No procedures performed Allergies: Crestor [rosuvastatin]; Nortriptyline; and Vicodin [hydrocodone-acetaminophen]   Assessment / Plan:     Visit Diagnoses: Fibromyalgia: She continues to have some generalized pain myalgias and arthralgias.  Other fatigue: She's been experiencing some fatigue.  Primary insomnia: Her insomnia has improved with good sleep hygiene.  Myalgia - Plan: COMPLETE METABOLIC PANEL WITH GFR, Magnesium, VITAMIN D 25 Hydroxy (Vit-D Deficiency, Fractures) I've advised her to drink plenty of fluids and use magnesium at bedtime.  Tendinopathy of right rotator cuff: Chronic discomfort  Primary osteoarthritis of both hands: Joint protection and muscle strengthening discussed.  Trochanteric bursitis of both hips: Improved  DDD cervical spine status post fusion: She  is fairly good range of motion.  DDD (degenerative disc disease), lumbar: Weight loss muscle strengthening and core strengthening discussed.  Osteopenia of multiple sites - 02/09/2016 DXA normal T -0.9   Other medical problems are listed as follows: History of gastroesophageal reflux (GERD)  History of hypertension  History of hypothyroidism  History of anemia  History of Meniere's disease  History of hyperlipidemia    Orders: Orders Placed This Encounter  Procedures  . COMPLETE METABOLIC PANEL WITH GFR  . Magnesium  . VITAMIN D 25 Hydroxy (Vit-D Deficiency, Fractures)   No orders of the defined types were placed in this encounter.   Face-to-face time spent with patient was 30 minutes. 50% of time was spent in counseling and  coordination of care.  Follow-Up Instructions: Return in about 6 months (around 06/14/2017) for FMS OA DDD.   Bo Merino, MD  Note - This record has been created using Editor, commissioning.  Chart creation errors have been sought, but may not always  have been located. Such creation errors do not reflect on  the standard of medical care.

## 2016-12-12 ENCOUNTER — Encounter: Payer: Self-pay | Admitting: Rheumatology

## 2016-12-12 ENCOUNTER — Encounter (INDEPENDENT_AMBULATORY_CARE_PROVIDER_SITE_OTHER): Payer: Self-pay

## 2016-12-12 ENCOUNTER — Ambulatory Visit (INDEPENDENT_AMBULATORY_CARE_PROVIDER_SITE_OTHER): Admitting: Rheumatology

## 2016-12-12 ENCOUNTER — Other Ambulatory Visit: Payer: Self-pay | Admitting: Rheumatology

## 2016-12-12 VITALS — BP 114/77 | HR 65 | Ht 63.0 in | Wt 150.0 lb

## 2016-12-12 DIAGNOSIS — M797 Fibromyalgia: Secondary | ICD-10-CM

## 2016-12-12 DIAGNOSIS — M47812 Spondylosis without myelopathy or radiculopathy, cervical region: Secondary | ICD-10-CM

## 2016-12-12 DIAGNOSIS — M19042 Primary osteoarthritis, left hand: Secondary | ICD-10-CM

## 2016-12-12 DIAGNOSIS — F5101 Primary insomnia: Secondary | ICD-10-CM | POA: Diagnosis not present

## 2016-12-12 DIAGNOSIS — Z8719 Personal history of other diseases of the digestive system: Secondary | ICD-10-CM | POA: Diagnosis not present

## 2016-12-12 DIAGNOSIS — M67911 Unspecified disorder of synovium and tendon, right shoulder: Secondary | ICD-10-CM | POA: Diagnosis not present

## 2016-12-12 DIAGNOSIS — M19041 Primary osteoarthritis, right hand: Secondary | ICD-10-CM | POA: Diagnosis not present

## 2016-12-12 DIAGNOSIS — M5136 Other intervertebral disc degeneration, lumbar region: Secondary | ICD-10-CM

## 2016-12-12 DIAGNOSIS — M5030 Other cervical disc degeneration, unspecified cervical region: Secondary | ICD-10-CM

## 2016-12-12 DIAGNOSIS — R5383 Other fatigue: Secondary | ICD-10-CM | POA: Diagnosis not present

## 2016-12-12 DIAGNOSIS — M7061 Trochanteric bursitis, right hip: Secondary | ICD-10-CM

## 2016-12-12 DIAGNOSIS — Z8639 Personal history of other endocrine, nutritional and metabolic disease: Secondary | ICD-10-CM | POA: Diagnosis not present

## 2016-12-12 DIAGNOSIS — M8589 Other specified disorders of bone density and structure, multiple sites: Secondary | ICD-10-CM | POA: Diagnosis not present

## 2016-12-12 DIAGNOSIS — Z8679 Personal history of other diseases of the circulatory system: Secondary | ICD-10-CM

## 2016-12-12 DIAGNOSIS — M7062 Trochanteric bursitis, left hip: Secondary | ICD-10-CM

## 2016-12-12 DIAGNOSIS — M791 Myalgia, unspecified site: Secondary | ICD-10-CM

## 2016-12-12 DIAGNOSIS — Z862 Personal history of diseases of the blood and blood-forming organs and certain disorders involving the immune mechanism: Secondary | ICD-10-CM

## 2016-12-12 DIAGNOSIS — Z8669 Personal history of other diseases of the nervous system and sense organs: Secondary | ICD-10-CM

## 2016-12-12 NOTE — Patient Instructions (Signed)
Magnesium malate 250 mg at bedtime for muscle cramps

## 2016-12-13 LAB — COMPLETE METABOLIC PANEL WITH GFR
ALT: 16 U/L (ref 6–29)
AST: 17 U/L (ref 10–35)
Albumin: 4.5 g/dL (ref 3.6–5.1)
Alkaline Phosphatase: 29 U/L — ABNORMAL LOW (ref 33–130)
BUN: 21 mg/dL (ref 7–25)
CO2: 28 mmol/L (ref 20–31)
Calcium: 10.5 mg/dL — ABNORMAL HIGH (ref 8.6–10.4)
Chloride: 102 mmol/L (ref 98–110)
Creat: 0.86 mg/dL (ref 0.50–0.99)
GFR, Est African American: 85 mL/min (ref >=60)
GFR, Est Non African American: 74 mL/min (ref >=60)
Glucose, Bld: 96 mg/dL (ref 65–99)
Potassium: 4.9 mmol/L (ref 3.5–5.3)
Sodium: 139 mmol/L (ref 135–146)
Total Bilirubin: 0.4 mg/dL (ref 0.2–1.2)
Total Protein: 7.4 g/dL (ref 6.1–8.1)

## 2016-12-13 LAB — VITAMIN D 25 HYDROXY (VIT D DEFICIENCY, FRACTURES): Vit D, 25-Hydroxy: 40 ng/mL (ref 30–100)

## 2016-12-13 LAB — MAGNESIUM: Magnesium: 1.6 mg/dL (ref 1.5–2.5)

## 2016-12-13 NOTE — Progress Notes (Signed)
Add phosphorous due to low alkaline phosphatase. Otherwise labs are unremarkable

## 2016-12-16 LAB — PHOSPHORUS: Phosphorus: 3.5 mg/dL (ref 2.5–4.5)

## 2017-01-16 ENCOUNTER — Other Ambulatory Visit: Payer: Self-pay | Admitting: Family Medicine

## 2017-02-06 ENCOUNTER — Other Ambulatory Visit: Payer: Self-pay | Admitting: Rheumatology

## 2017-02-06 NOTE — Telephone Encounter (Signed)
Last Visit: 12/12/16 Next Visit: 06/14/17  Okay to refill per Dr. Estanislado Pandy

## 2017-02-17 ENCOUNTER — Other Ambulatory Visit: Payer: Self-pay | Admitting: Rheumatology

## 2017-02-17 NOTE — Telephone Encounter (Signed)
Last Visit: 12/12/16 Next Visit: 06/14/17  Okay to refill per Dr. Estanislado Pandy

## 2017-02-21 ENCOUNTER — Other Ambulatory Visit: Payer: Self-pay | Admitting: Family Medicine

## 2017-03-02 ENCOUNTER — Other Ambulatory Visit: Payer: Self-pay | Admitting: Family Medicine

## 2017-03-17 ENCOUNTER — Other Ambulatory Visit: Payer: Self-pay | Admitting: Rheumatology

## 2017-03-17 NOTE — Telephone Encounter (Signed)
Last Visit: 12/12/16 Next Visit: 06/14/17  Okay to refill per Dr. Estanislado Pandy

## 2017-04-03 ENCOUNTER — Other Ambulatory Visit: Payer: Self-pay | Admitting: Family Medicine

## 2017-04-04 IMAGING — CR DG CHEST 2V
2 series · 2 of 2 positions shown · non-contrast
Comparison: 07/10/2009

CLINICAL DATA: Preoperative assessment for heart catheterization

EXAM:
CHEST  2 VIEW

[w chest pa]
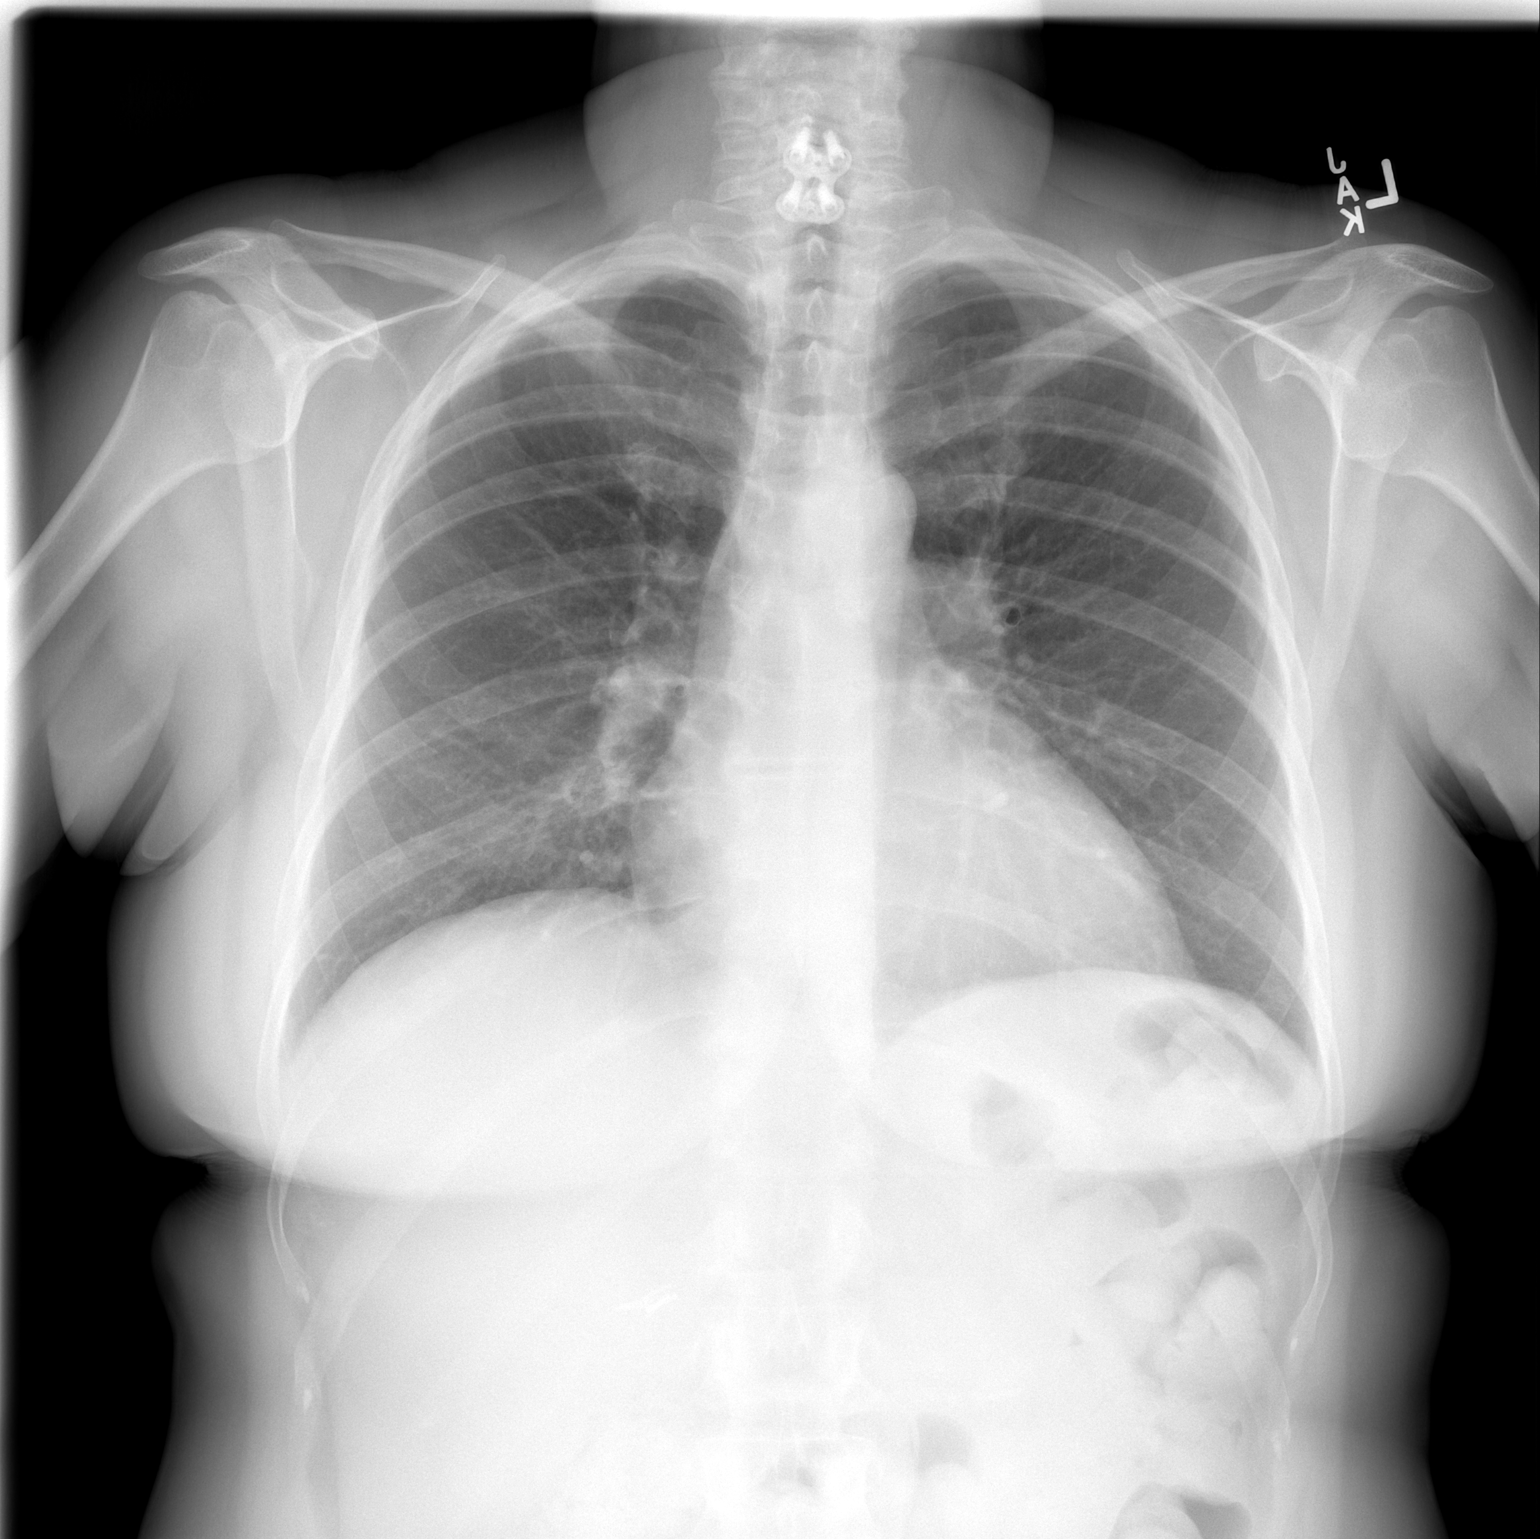

[w chest lat]
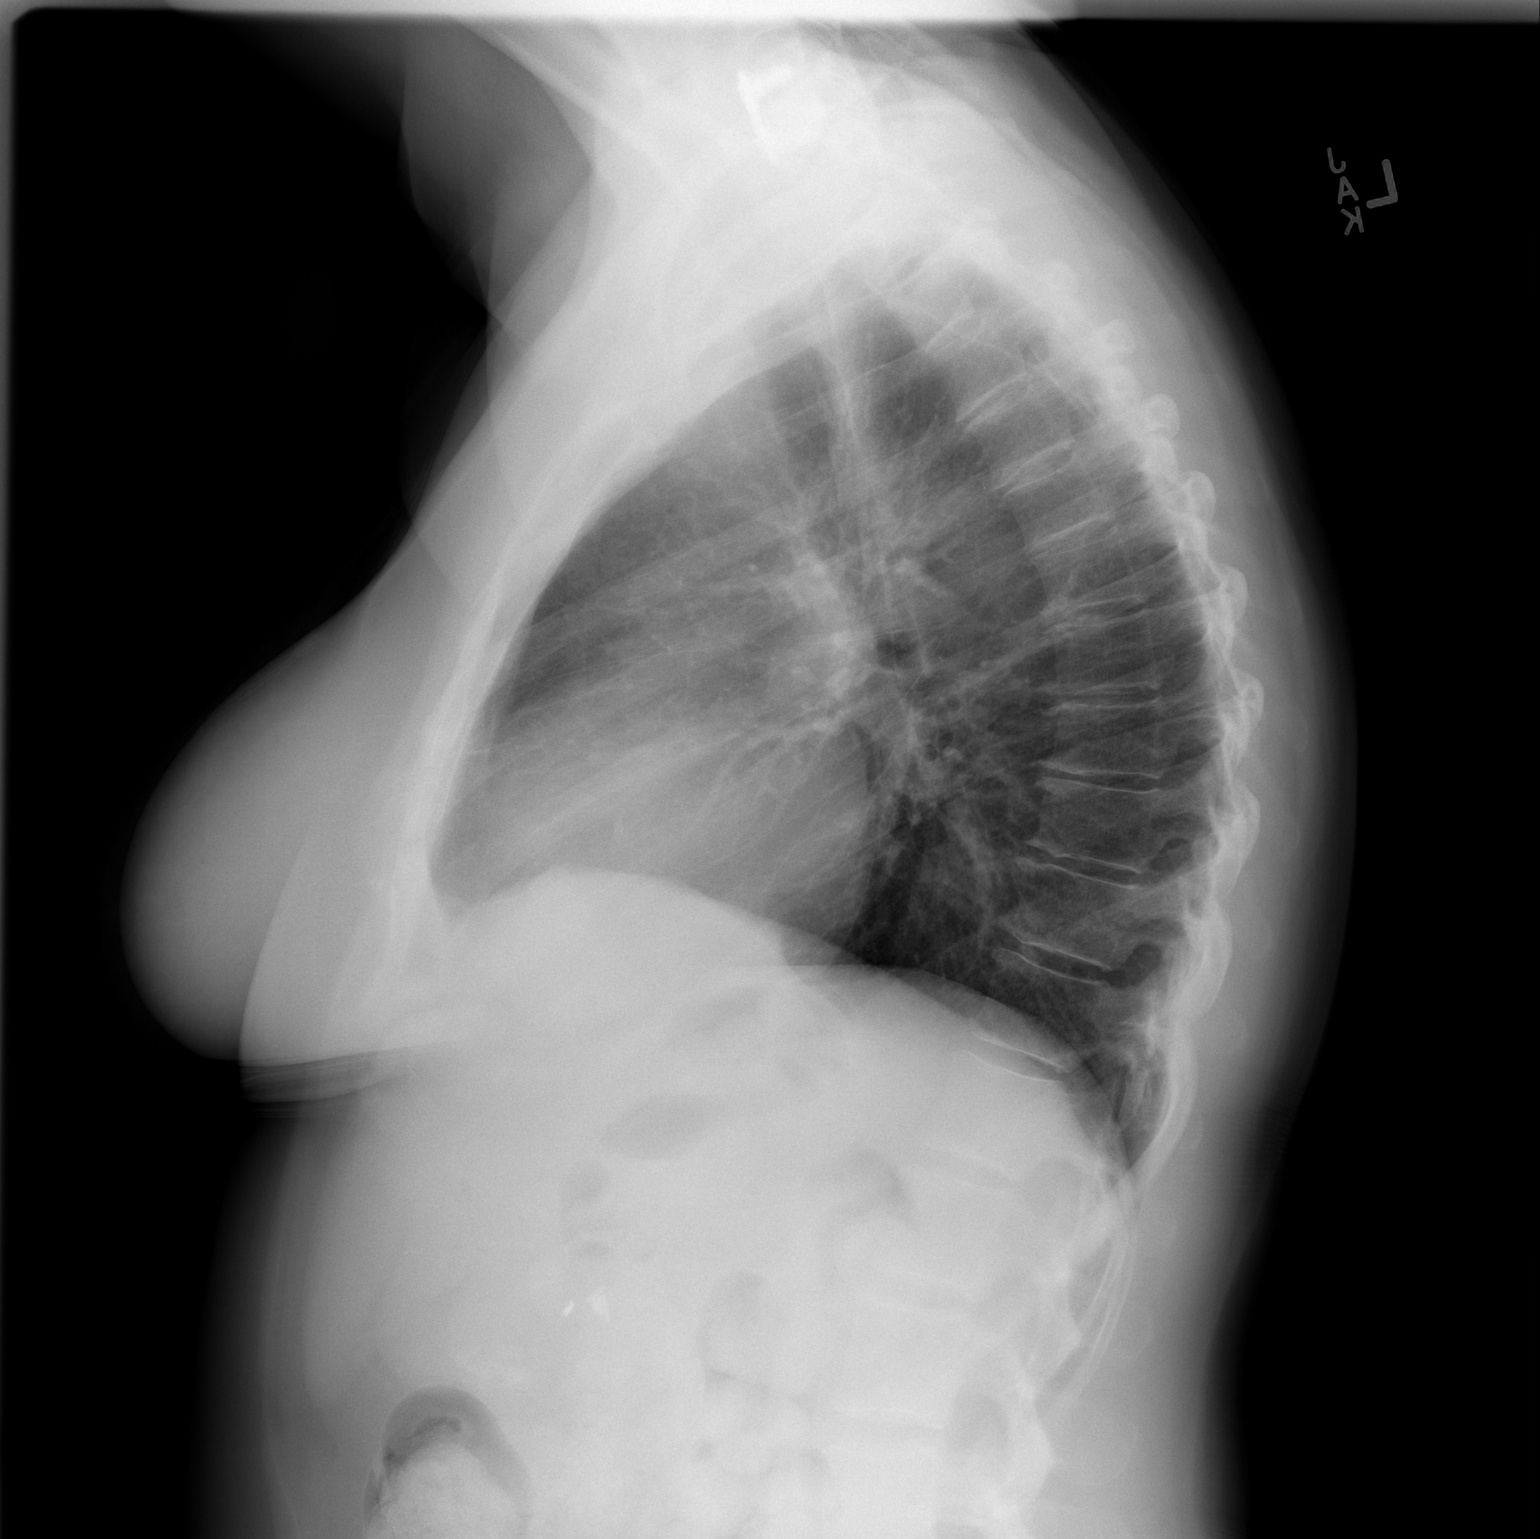

[2 of 2 positions shown; findings below may reference images not displayed]

FINDINGS: Upper normal heart size.

Mediastinal contours and pulmonary vascularity normal.

Mild chronic side peribronchial thickening.

No acute infiltrate, pleural effusion, or pneumothorax.

Prior cervical spine fusion.

No acute osseous findings.

Surgical clips RIGHT upper quadrant likely prior cholecystectomy.
IMPRESSION: Mild chronic bronchitic changes.

No acute abnormalities.

## 2017-04-10 ENCOUNTER — Other Ambulatory Visit: Payer: Self-pay | Admitting: Gastroenterology

## 2017-04-11 ENCOUNTER — Other Ambulatory Visit: Payer: Self-pay | Admitting: Family Medicine

## 2017-04-16 ENCOUNTER — Other Ambulatory Visit: Payer: Self-pay | Admitting: Gastroenterology

## 2017-04-17 ENCOUNTER — Other Ambulatory Visit: Payer: Self-pay | Admitting: Cardiovascular Disease

## 2017-04-17 ENCOUNTER — Other Ambulatory Visit: Payer: Self-pay | Admitting: Rheumatology

## 2017-04-17 NOTE — Telephone Encounter (Signed)
Last Visit: 12/12/16 Next Visit: 06/14/17  Okay to refill per Dr. Estanislado Pandy

## 2017-04-17 NOTE — Telephone Encounter (Signed)
Please review for refill, thanks ! 

## 2017-04-18 NOTE — Telephone Encounter (Signed)
REFILL 

## 2017-04-21 ENCOUNTER — Other Ambulatory Visit: Payer: Self-pay | Admitting: Family Medicine

## 2017-05-08 ENCOUNTER — Other Ambulatory Visit: Payer: Self-pay | Admitting: Rheumatology

## 2017-05-08 NOTE — Telephone Encounter (Signed)
Last Visit: 12/12/16 Next Visit: 06/14/17  Okay to refill per Dr. Estanislado Pandy

## 2017-05-09 ENCOUNTER — Other Ambulatory Visit: Payer: Self-pay | Admitting: Family Medicine

## 2017-05-17 ENCOUNTER — Other Ambulatory Visit: Payer: Self-pay | Admitting: Cardiovascular Disease

## 2017-05-17 ENCOUNTER — Other Ambulatory Visit: Payer: Self-pay | Admitting: Gastroenterology

## 2017-05-17 NOTE — Telephone Encounter (Signed)
Refill Request.  

## 2017-05-17 NOTE — Telephone Encounter (Signed)
REFILL 

## 2017-05-19 ENCOUNTER — Other Ambulatory Visit: Payer: Self-pay | Admitting: Family Medicine

## 2017-05-30 ENCOUNTER — Other Ambulatory Visit: Payer: Self-pay | Admitting: Family Medicine

## 2017-06-01 NOTE — Progress Notes (Signed)
Office Visit Note  Patient: Kimberly Stein             Date of Birth: May 16, 1956           MRN: [id number]             PCP: Eulas Post, MD Referring: Eulas Post, MD Visit Date: 06/14/2017 Occupation: @GUAROCC @    Subjective:  Hip pain   History of Present Illness: Kimberly Stein is a 62 y.o. female  With history of fibromyalgia, osteoarthritis, and DDD.  Patient reports she continues to take Flexeril and Gabapentin, which have been managing her fibromyalgia.  She denies needing any refills. Her insomnia and fatigue have improved. She states she has a nerve ablation in her lumbar region with Dr. Maryjean Ka on December 21st and had a flare for a couple weeks, but she is doing much better now.  She states that after the nerve ablation she gets about 4.5 months of relief.  Her next ablation is scheduled for June 21st.   She states she continues to have muscle spasms.  She states that magnesium malate has helped considerably.   She states that she continues to have bilateral trochanteric bursitis.  She uses voltaren gel in these areas.  She states that her previous cortisone injection did not help.  She performs exercises regularly and tries to alternate laying on different sides when she sleeps.  She states that her neck, shoulders, and hands have been doing well.  She has no joint swelling.           Activities of Daily Living:  Patient reports morning stiffness for 20 minutes.   Patient Reports nocturnal pain.  Difficulty dressing/grooming: Denies Difficulty climbing stairs: Denies Difficulty getting out of chair: Denies Difficulty using hands for taps, buttons, cutlery, and/or writing: Denies   Review of Systems  Constitutional: Positive for fatigue. Negative for weakness.  HENT: Positive for mouth dryness (Biotene products). Negative for mouth sores and nose dryness.   Eyes: Negative for redness, visual disturbance and dryness.  Respiratory: Negative for cough,  hemoptysis, shortness of breath and difficulty breathing.   Cardiovascular: Negative for chest pain, palpitations, hypertension, irregular heartbeat and swelling in legs/feet.  Gastrointestinal: Negative for blood in stool, constipation and diarrhea.  Endocrine: Negative for increased urination.  Genitourinary: Negative for painful urination.  Musculoskeletal: Positive for arthralgias, joint pain and morning stiffness. Negative for joint swelling, myalgias, muscle weakness, muscle tenderness and myalgias.  Skin: Negative for color change, pallor, rash, hair loss, nodules/bumps, redness, skin tightness, ulcers and sensitivity to sunlight.  Neurological: Negative for dizziness, numbness and headaches.  Hematological: Negative for swollen glands.  Psychiatric/Behavioral: Positive for sleep disturbance. Negative for depressed mood. The patient is not nervous/anxious.     PMFS History:  Patient Active Problem List   Diagnosis Date Noted  . Other fatigue 06/13/2016  . Primary insomnia 06/13/2016  . Primary osteoarthritis of both hands 06/13/2016  . Trochanteric bursitis of both hips 06/13/2016  . DDD cervical spine status post fusion 06/13/2016  . Osteoarthritis of lumbar spine 06/13/2016  . Tendinopathy of right rotator cuff 06/13/2016  . Chest pain 02/25/2015  . Abnormal nuclear stress test 02/25/2015  . Hyperlipidemia 07/17/2013  . Osteopenia 01/28/2012  . 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 CELIAC SPRUE 09/29/2009  . Hypothyroidism 09/07/2009  . Meniere's disease 09/07/2009  . Essential hypertension 09/07/2009  . RUQ PAIN 09/07/2009  . Fibromyalgia 06/12/2009  . CERVICALGIA 06/03/2009  . ANEMIA-IRON DEFICIENCY 10/21/2008  .  GERD 10/21/2008  . DIZZINESS 10/21/2008  . COLONIC POLYPS, HX OF 10/21/2008    Past Medical History:  Diagnosis Date  . Allergy    sinus infections  . Anemia   . Arthritis   . Celiac disease   . GERD (gastroesophageal reflux disease)   . Heart murmur   .  Hiatal hernia   . Hyperlipidemia   . Hypertension   . Meniere's disease   . Neuromuscular disorder (HCC)    fibromyalgia  . Thyroid disease    hypothyroid  . 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 Celiac disease Mother   . Diabetes Mother   . Heart disease Mother   . Prostate cancer Father   . Breast cancer Maternal Aunt   . Diabetes Sister   . Meniere's disease Sister   . Celiac disease Sister   . Diabetes Sister   . Colon cancer Neg Hx   . Esophageal cancer Neg Hx   . Stomach cancer Neg Hx    Past Surgical History:  Procedure Laterality Date  . CARDIAC CATHETERIZATION N/A 02/25/2015   Procedure: Left Heart Cath and Coronary Angiography;  Surgeon: Peter M Martinique, MD;  Location: [facility];  Service: Cardiovascular;  Laterality: N/A;  . CERVICAL DISC SURGERY    . CHOLECYSTECTOMY    . COLONOSCOPY    . DILATION AND CURETTAGE OF UTERUS    . HAND SURGERY     right hand; calcium deposit on wrist and under thumb and ganglion cyst and carpal tunnel  . lumbar radiofrequency nerve ablation     twice yearly  . MOUTH SURGERY  2016  . ROTATOR CUFF REPAIR  06/07/2015   left shoulder  . TOTAL ABDOMINAL HYSTERECTOMY    . UPPER GASTROINTESTINAL ENDOSCOPY     Social History   Social History Narrative  . Not on file     Objective: Vital Signs: BP 116/77 (BP Location: Left Arm, Patient Position: Sitting, Cuff Size: Normal)   Pulse 87   Resp 14   Ht 5' 3"  (1.6 m)   Wt 150 lb (68 kg)   BMI 26.57 kg/m    Physical Exam  Constitutional: She is oriented to person, place, and time. She appears well-developed and well-nourished.  HENT:  Head: Normocephalic and atraumatic.  Eyes: Conjunctivae and EOM are normal.  Neck: Normal range of motion.  Cardiovascular: Normal rate, regular rhythm, normal heart sounds and intact distal pulses.  Pulmonary/Chest: Effort normal and breath sounds normal.  Abdominal: Soft. Bowel sounds  are normal.  Lymphadenopathy:    She has no cervical adenopathy.  Neurological: She is alert and oriented to person, place, and time.  Skin: Skin is warm and dry. Capillary refill takes less than 2 seconds.  Psychiatric: She has a normal mood and affect. Her behavior is normal.  Nursing note and vitals reviewed.    Musculoskeletal Exam: C-spine, thoracic, and lumbar spine good ROM.  Shoulder joints, elbow joints, wrist joints, MCPs, PIPs, and DIPs good ROM.  PIP and DIP synovial thickening consistent with osteoarthritis.  Hip joints, knee joints, ankle joints, MTPs, PIPs, and DIPs good ROM with no synovitis.  No knee crepitus, warmth, or effusion.  She has bilateral trochanteric bursitis.  She has no midline spinal tenderness.  No SI joint tenderness.    CDAI Exam: No CDAI exam completed.    Investigation: No additional findings.   Imaging: No results found.  Speciality Comments:  No specialty comments available.    Procedures:  No procedures performed Allergies: Crestor [rosuvastatin]; Nortriptyline; and Vicodin [hydrocodone-acetaminophen]   Assessment / Plan:     Visit Diagnoses: Fibromyalgia: She had a flare following a nerve ablation performs by Dr. Maryjean Ka on 05/12/17, but she is improving and has less pain in her lumbar region. She continues to have muscle tenderness and muscle tension in the trapezius region.  She also has bilateral trochanteric bursitis.  She performs exercises at home for the bursitis. Cortisone injections in the past have not provided a benefit.  She uses Voltaren gel, which helps.  She continues to take Flexeril and Gabapentin.  She does not need any refills at this time.       Primary osteoarthritis of both hands: She has PIP and DIP synovial thickening consistent with osteoarthritis.  Joint protection and muscle strengthening were discussed.    Trochanteric bursitis of both hips: She continues to have bilateral trochanteric bursitis.  Her last cortisone  injection on the left hip provided no benefit. She declined a cortisone injection.  She performs regular exercises at home.  She also uses Voltaren gel.  ITB and exercise were demonstrated in the office today and a handout was given. I offered physical therapy but she declined.  Tendinopathy of right rotator cuff:  Full ROM with no discomfort.    DDD (degenerative disc disease), cervical - She has full ROM.  She has some trapezius muscle tension and tenderness bilaterally.    DDD (degenerative disc disease), lumbar: No midline spinal tenderness.  She continues to see Dr. Maryjean Ka for nerve ablations every 6 months.  Her most recent ablation was on May 12, 2017.  She states she has pain relief for about 4.5 months following the ablations typically.    Other fatigue: Improved.   Primary insomnia: Improved  Osteopenia of multiple sites - 02/09/2016 DXA normal T -0.9  Other medical conditions are listed as follows:   History of hyperlipidemia  History of gastroesophageal reflux (GERD)  History of Meniere's disease  History of hypertension  History of anemia  History of hypothyroidism    Orders: No orders of the defined types were placed in this encounter.  No orders of the defined types were placed in this encounter.   Face-to-face time spent with patient was 30 minutes. Greater than 50% of time was spent in counseling and coordination of care.  Follow-Up Instructions: Return in about 6 months (around 12/12/2017) for Fibromyalgia, Osteoarthritis, DDD.   Bo Merino, MD  Note - This record has been created using Editor, commissioning.  Chart creation errors have been sought, but may not always  have been located. Such creation errors do not reflect on  the standard of medical care.

## 2017-06-09 ENCOUNTER — Encounter: Payer: Self-pay | Admitting: Family Medicine

## 2017-06-09 ENCOUNTER — Ambulatory Visit (INDEPENDENT_AMBULATORY_CARE_PROVIDER_SITE_OTHER): Admitting: Family Medicine

## 2017-06-09 VITALS — BP 110/60 | HR 85 | Temp 98.4°F | Ht 63.0 in | Wt 145.0 lb

## 2017-06-09 DIAGNOSIS — R300 Dysuria: Secondary | ICD-10-CM | POA: Diagnosis not present

## 2017-06-09 DIAGNOSIS — R252 Cramp and spasm: Secondary | ICD-10-CM | POA: Diagnosis not present

## 2017-06-09 DIAGNOSIS — E038 Other specified hypothyroidism: Secondary | ICD-10-CM

## 2017-06-09 LAB — POCT URINALYSIS DIPSTICK
Bilirubin, UA: NEGATIVE
Blood, UA: NEGATIVE
Glucose, UA: NEGATIVE
Ketones, UA: NEGATIVE
Leukocytes, UA: NEGATIVE
Nitrite, UA: NEGATIVE
Protein, UA: NEGATIVE
Spec Grav, UA: 1.015 (ref 1.010–1.025)
Urobilinogen, UA: 0.2 E.U./dL
pH, UA: 7.5 (ref 5.0–8.0)

## 2017-06-09 NOTE — Patient Instructions (Addendum)
Leg Cramps Leg cramps occur when a muscle or muscles tighten and you have no control over this tightening (involuntary muscle contraction). Muscle cramps can develop in any muscle, but the most common place is in the calf muscles of the leg. Those cramps can occur during exercise or when you are at rest. Leg cramps are painful, and they may last for a few seconds to a few minutes. Cramps may return several times before they finally stop. Usually, leg cramps are not caused by a serious medical problem. In many cases, the cause is not known. Some common causes include:  Overexertion.  Overuse from repetitive motions, or doing the same thing over and over.  Remaining in a certain position for a long period of time.  Improper preparation, form, or technique while performing a sport or an activity.  Dehydration.  Injury.  Side effects of some medicines.  Abnormally low levels of the salts and ions in your blood (electrolytes), especially potassium and calcium. These levels could be low if you are taking water pills (diuretics) or if you are pregnant.  Follow these instructions at home: Watch your condition for any changes. Taking the following actions may help to lessen any discomfort that you are feeling:  Stay well-hydrated. Drink enough fluid to keep your urine clear or pale yellow.  Try massaging, stretching, and relaxing the affected muscle. Do this for several minutes at a time.  For tight or tense muscles, use a warm towel, heating pad, or hot shower water directed to the affected area.  If you are sore or have pain after a cramp, applying ice to the affected area may relieve discomfort. ? Put ice in a plastic bag. ? Place a towel between your skin and the bag. ? Leave the ice on for 20 minutes, 2-3 times per day.  Avoid strenuous exercise for several days if you have been having frequent leg cramps.  Make sure that your diet includes the essential minerals for your muscles to  work normally.  Take medicines only as directed by your health care provider.  Contact a health care provider if:  Your leg cramps get more severe or more frequent, or they do not improve over time.  Your foot becomes cold, numb, or blue. This information is not intended to replace advice given to you by your health care provider. Make sure you discuss any questions you have with your health care provider. Document Released: 06/16/2004 Document Revised: 10/15/2015 Document Reviewed: 04/16/2014 Elsevier Interactive Patient Education  2018 [employer].  Drink more water Consider multi-B vitamin to see if that helps reduce leg cramps. Set up physical exam soon.

## 2017-06-09 NOTE — Progress Notes (Signed)
Subjective:     Patient ID: Kimberly Stein, female   DOB: 12-24-1955, 62 y.o.   MRN: [id number]  HPI Patient seen for the following issues  She just noticed some foul odor to her urine yesterday but none today. No burning with urination. No suprapubic discomfort. No gross hematuria. No fevers or chills. She also had some fleeting bilateral flank pain. She has history of some chronic back pains.  History of frequent muscle cramps especially legs at night. She had magnesium level I.6 per rheumatologist last summer and went on some magnesium and that seemed to help for while. She took her self off magnesium and started that back about a week ago but still had muscle cramps. She states that she probably doesn't drink enough fluids generally. She is on chlorthalidone but had chemistries last summer and normal potassium. She also is on potassium supplement.  Still smokes. Denies any claudication-type symptoms.  History of hypothyroidism. She is on replacement and takes her medication regularly. She is overdue for labs but plans to set up physical soon  Past Medical History:  Diagnosis Date  . Allergy    sinus infections  . Anemia   . Arthritis   . Celiac disease   . GERD (gastroesophageal reflux disease)   . Heart murmur   . Hiatal hernia   . Hyperlipidemia   . Hypertension   . Meniere's disease   . Neuromuscular disorder (HCC)    fibromyalgia  . Thyroid disease    hypothyroid  . 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 CARDIAC CATHETERIZATION N/A 02/25/2015   Procedure: Left Heart Cath and Coronary Angiography;  Surgeon: Peter M Martinique, MD;  Location: [facility];  Service: Cardiovascular;  Laterality: N/A;  . CERVICAL DISC SURGERY    . CHOLECYSTECTOMY    . COLONOSCOPY    . DILATION AND CURETTAGE OF UTERUS    . HAND SURGERY     right hand; calcium deposit on wrist and under thumb and ganglion cyst and carpal tunnel  .  lumbar radiofrequency nerve ablation     twice yearly  . MOUTH SURGERY  2016  . ROTATOR CUFF REPAIR  06/07/2015   left shoulder  . TOTAL ABDOMINAL HYSTERECTOMY    . UPPER GASTROINTESTINAL ENDOSCOPY      reports that she has been smoking cigarettes.  She has been smoking about 1.00 pack per day. she has never used smokeless tobacco. She reports that she does not drink alcohol or use drugs. family history includes Breast cancer in her maternal aunt; Celiac disease in her mother and sister; Diabetes in her mother, sister, and sister; Heart disease in her mother; Meniere's disease in her sister; Prostate cancer in her father. Allergies  Allergen Reactions  . Crestor [Rosuvastatin] Other (See Comments)    Bilateral Leg pain  . Nortriptyline Rash  . Vicodin [Hydrocodone-Acetaminophen] Itching     Review of Systems  Constitutional: Negative for fatigue.  Eyes: Negative for visual disturbance.  Respiratory: Negative for cough, chest tightness, shortness of breath and wheezing.   Cardiovascular: Negative for chest pain, palpitations and leg swelling.  Endocrine: Negative for polydipsia and polyuria.  Genitourinary: Positive for dysuria. Negative for hematuria, pelvic pain and vaginal discharge.  Neurological: Negative for dizziness, seizures, syncope, weakness, light-headedness and headaches.       Objective:   Physical Exam  Constitutional: She is oriented to person, place, and time. She appears well-developed and well-nourished.  Neck: Neck supple. No thyromegaly present.  Cardiovascular: Normal rate and regular rhythm.  Pulmonary/Chest: Effort normal and breath sounds normal. No respiratory distress. She has no wheezes. She has no rales.  Musculoskeletal: She exhibits no edema.  Neurological: She is alert and oriented to person, place, and time.       Assessment:     #1 dysuria. Rule out UTI-no evidence from urine dipstick for UTI  #2 frequent muscle cramps-question secondary  to dehydration  #3 hypothyroidism overdue for follow-up labs     Plan:     -Check urine dipstick-completely normal with no evidence for infection -Increase water consumption -Consider multivitamin -Set up complete physical  Eulas Post MD [facility] at [facility]

## 2017-06-14 ENCOUNTER — Encounter: Payer: Self-pay | Admitting: Rheumatology

## 2017-06-14 ENCOUNTER — Ambulatory Visit (INDEPENDENT_AMBULATORY_CARE_PROVIDER_SITE_OTHER): Admitting: Rheumatology

## 2017-06-14 VITALS — BP 116/77 | HR 87 | Resp 14 | Ht 63.0 in | Wt 150.0 lb

## 2017-06-14 DIAGNOSIS — M797 Fibromyalgia: Secondary | ICD-10-CM | POA: Diagnosis not present

## 2017-06-14 DIAGNOSIS — F5101 Primary insomnia: Secondary | ICD-10-CM | POA: Diagnosis not present

## 2017-06-14 DIAGNOSIS — Z8679 Personal history of other diseases of the circulatory system: Secondary | ICD-10-CM

## 2017-06-14 DIAGNOSIS — M7061 Trochanteric bursitis, right hip: Secondary | ICD-10-CM

## 2017-06-14 DIAGNOSIS — M19041 Primary osteoarthritis, right hand: Secondary | ICD-10-CM | POA: Diagnosis not present

## 2017-06-14 DIAGNOSIS — M5136 Other intervertebral disc degeneration, lumbar region: Secondary | ICD-10-CM

## 2017-06-14 DIAGNOSIS — Z8639 Personal history of other endocrine, nutritional and metabolic disease: Secondary | ICD-10-CM | POA: Diagnosis not present

## 2017-06-14 DIAGNOSIS — Z8719 Personal history of other diseases of the digestive system: Secondary | ICD-10-CM

## 2017-06-14 DIAGNOSIS — M7062 Trochanteric bursitis, left hip: Secondary | ICD-10-CM

## 2017-06-14 DIAGNOSIS — M5030 Other cervical disc degeneration, unspecified cervical region: Secondary | ICD-10-CM

## 2017-06-14 DIAGNOSIS — M67911 Unspecified disorder of synovium and tendon, right shoulder: Secondary | ICD-10-CM

## 2017-06-14 DIAGNOSIS — M19042 Primary osteoarthritis, left hand: Secondary | ICD-10-CM

## 2017-06-14 DIAGNOSIS — Z862 Personal history of diseases of the blood and blood-forming organs and certain disorders involving the immune mechanism: Secondary | ICD-10-CM

## 2017-06-14 DIAGNOSIS — Z8669 Personal history of other diseases of the nervous system and sense organs: Secondary | ICD-10-CM

## 2017-06-14 DIAGNOSIS — R5383 Other fatigue: Secondary | ICD-10-CM

## 2017-06-14 DIAGNOSIS — M8589 Other specified disorders of bone density and structure, multiple sites: Secondary | ICD-10-CM

## 2017-06-14 NOTE — Patient Instructions (Signed)
Iliotibial Band Syndrome Rehab  Ask your health care provider which exercises are safe for you. Do exercises exactly as told by your health care provider and adjust them as directed. It is normal to feel mild stretching, pulling, tightness, or discomfort as you do these exercises, but you should stop right away if you feel sudden pain or your pain gets worse. Do not begin these exercises until told by your health care provider.  Stretching and range of motion exercises  These exercises warm up your muscles and joints and improve the movement and flexibility of your hip and pelvis.  Exercise A: Quadriceps, prone    1. Lie on your abdomen on a firm surface, such as a bed or padded floor.  2. Bend your left / right knee and hold your ankle. If you cannot reach your ankle or pant leg, loop a belt around your foot and grab the belt instead.  3. Gently pull your heel toward your buttocks. Your knee should not slide out to the side. You should feel a stretch in the front of your thigh and knee.  4. Hold this position for __________ seconds.  Repeat __________ times. Complete this stretch __________ times a day.  Exercise B: Iliotibial band    1. Lie on your side with your left / right leg in the top position.  2. Bend both of your knees and grab your left / right ankle. Stretch out your bottom arm to help you balance.  3. Slowly bring your top knee back so your thigh goes behind your trunk.  4. Slowly lower your top leg toward the floor until you feel a gentle stretch on the outside of your left / right hip and thigh. If you do not feel a stretch and your knee will not fall farther, place the heel of your other foot on top of your knee and pull your knee down toward the floor with your foot.  5. Hold this position for __________ seconds.  Repeat __________ times. Complete this stretch __________ times a day.  Strengthening exercises  These exercises build strength and endurance in your hip and pelvis. Endurance is the  ability to use your muscles for a long time, even after they get tired.  Exercise C: Straight leg raises (  hip abductors)  1. Lie on your side with your left / right leg in the top position. Lie so your head, shoulder, knee, and hip line up. You may bend your bottom knee to help you balance.  2. Roll your hips slightly forward so your hips are stacked directly over each other and your left / right knee is facing forward.  3. Tense the muscles in your outer thigh and lift your top leg 4-6 inches (10-15 cm).  4. Hold this position for __________ seconds.  5. Slowly return to the starting position. Let your muscles relax completely before doing another repetition.  Repeat __________ times. Complete this exercise __________ times a day.  Exercise D: Straight leg raises (  hip extensors)  1. Lie on your abdomen on your bed or a firm surface. You can put a pillow under your hips if that is more comfortable.  2. Bend your left / right knee so your foot is straight up in the air.  3. Squeeze your buttock muscles and lift your left / right thigh off the bed. Do not let your back arch.  4. Tense this muscle as hard as you can without increasing any knee pain.    5. Hold this position for __________ seconds.  6. Slowly lower your leg to the starting position and allow it to relax completely.  Repeat __________ times. Complete this exercise __________ times a day.  Exercise E: Hip hike  1. Stand sideways on a bottom step. Stand on your left / right leg with your other foot unsupported next to the step. You can hold onto the railing or wall if needed for balance.  2. Keep your knees straight and your torso square. Then, lift your left / right hip up toward the ceiling.  3. Slowly let your left / right hip lower toward the floor, past the starting position. Your foot should get closer to the floor. Do not lean or bend your knees.  Repeat __________ times. Complete this exercise __________ times a day.  This information is not  intended to replace advice given to you by your health care provider. Make sure you discuss any questions you have with your health care provider.  Document Released: 05/09/2005 Document Revised: 01/12/2016 Document Reviewed: 04/10/2015  Elsevier Interactive Patient Education © 2018 [employer]

## 2017-06-15 ENCOUNTER — Other Ambulatory Visit: Payer: Self-pay | Admitting: Cardiovascular Disease

## 2017-06-15 NOTE — Telephone Encounter (Signed)
Please review for refill, Thanks !  

## 2017-06-18 ENCOUNTER — Other Ambulatory Visit: Payer: Self-pay | Admitting: Family Medicine

## 2017-06-22 ENCOUNTER — Telehealth: Payer: Self-pay | Admitting: Cardiovascular Disease

## 2017-06-22 NOTE — Telephone Encounter (Signed)
New message   Pt c/o medication issue:  1. Name of Medication:fenofibrate (TRICOR) 145 MG tablet  2. How are you currently taking this medication (dosage and times per day)? TAKE 1 TABLET(145 MG) BY MOUTH DAILY. NEED OFFICE VISIT  3. Are you having a reaction (difficulty breathing--STAT)? no  4. What is your medication issue? Pt says that her medication bottle says that she needs to set up an appt But she's not sure if its with [facility] or pharmacy, nothing is in epic, please call

## 2017-06-22 NOTE — Telephone Encounter (Signed)
Spoke with pt, Follow up scheduled  

## 2017-06-27 ENCOUNTER — Encounter: Admitting: Family Medicine

## 2017-06-30 ENCOUNTER — Other Ambulatory Visit

## 2017-06-30 ENCOUNTER — Ambulatory Visit (INDEPENDENT_AMBULATORY_CARE_PROVIDER_SITE_OTHER): Admitting: Family Medicine

## 2017-06-30 ENCOUNTER — Other Ambulatory Visit: Payer: Self-pay | Admitting: Acute Care

## 2017-06-30 ENCOUNTER — Encounter: Payer: Self-pay | Admitting: Family Medicine

## 2017-06-30 VITALS — BP 110/76 | HR 78 | Temp 98.4°F | Ht 62.5 in | Wt 149.9 lb

## 2017-06-30 DIAGNOSIS — F5101 Primary insomnia: Secondary | ICD-10-CM | POA: Diagnosis not present

## 2017-06-30 DIAGNOSIS — Z0000 Encounter for general adult medical examination without abnormal findings: Secondary | ICD-10-CM

## 2017-06-30 DIAGNOSIS — Z780 Asymptomatic menopausal state: Secondary | ICD-10-CM

## 2017-06-30 DIAGNOSIS — Z122 Encounter for screening for malignant neoplasm of respiratory organs: Secondary | ICD-10-CM

## 2017-06-30 DIAGNOSIS — F17210 Nicotine dependence, cigarettes, uncomplicated: Secondary | ICD-10-CM

## 2017-06-30 LAB — HEPATIC FUNCTION PANEL
ALT: 16 U/L (ref 0–35)
AST: 19 U/L (ref 0–37)
Albumin: 4.5 g/dL (ref 3.5–5.2)
Alkaline Phosphatase: 25 U/L — ABNORMAL LOW (ref 39–117)
Bilirubin, Direct: 0.1 mg/dL (ref 0.0–0.3)
Total Bilirubin: 0.5 mg/dL (ref 0.2–1.2)
Total Protein: 7.4 g/dL (ref 6.0–8.3)

## 2017-06-30 LAB — CBC WITH DIFFERENTIAL/PLATELET
Basophils Absolute: 42 cells/uL (ref 0–200)
Basophils Relative: 0.6 %
Eosinophils Absolute: 63 cells/uL (ref 15–500)
Eosinophils Relative: 0.9 %
HCT: 37.6 % (ref 35.0–45.0)
Hemoglobin: 13.2 g/dL (ref 11.7–15.5)
Lymphs Abs: 3339 cells/uL (ref 850–3900)
MCH: 32 pg (ref 27.0–33.0)
MCHC: 35.1 g/dL (ref 32.0–36.0)
MCV: 91.3 fL (ref 80.0–100.0)
MPV: 12.2 fL (ref 7.5–12.5)
Monocytes Relative: 5.3 %
Neutro Abs: 3185 cells/uL (ref 1500–7800)
Neutrophils Relative %: 45.5 %
Platelets: 216 10*3/uL (ref 140–400)
RBC: 4.12 10*6/uL (ref 3.80–5.10)
RDW: 12.5 % (ref 11.0–15.0)
Total Lymphocyte: 47.7 %
WBC mixed population: 371 cells/uL (ref 200–950)
WBC: 7 10*3/uL (ref 3.8–10.8)

## 2017-06-30 LAB — TSH: TSH: 0.43 u[IU]/mL (ref 0.35–4.50)

## 2017-06-30 LAB — LIPID PANEL
Cholesterol: 225 mg/dL — ABNORMAL HIGH (ref 0–200)
HDL: 54.4 mg/dL (ref >39.00)
LDL Cholesterol: 145 mg/dL — ABNORMAL HIGH (ref 0–99)
NonHDL: 170.37
Total CHOL/HDL Ratio: 4
Triglycerides: 126 mg/dL (ref 0.0–149.0)
VLDL: 25.2 mg/dL (ref 0.0–40.0)

## 2017-06-30 LAB — BASIC METABOLIC PANEL
BUN: 22 mg/dL (ref 6–23)
CO2: 32 mEq/L (ref 19–32)
Calcium: 10.1 mg/dL (ref 8.4–10.5)
Chloride: 101 mEq/L (ref 96–112)
Creatinine, Ser: 0.82 mg/dL (ref 0.40–1.20)
GFR: 75.19 mL/min (ref >60.00)
Glucose, Bld: 98 mg/dL (ref 70–99)
Potassium: 4 mEq/L (ref 3.5–5.1)
Sodium: 136 mEq/L (ref 135–145)

## 2017-06-30 NOTE — Patient Instructions (Signed)
Set up repeat colonoscopy.

## 2017-06-30 NOTE — Progress Notes (Signed)
Subjective:     Patient ID: Kimberly Stein, female   DOB: [birth year], 62 y.o.   MRN: [id number]  HPI Patient seen for physical exam. She has had previous hysterectomy and does not get Pap smears. She declines flu vaccine. She had previous shingles vaccine few years ago. She is overdue for repeat colonoscopy. History of adenomatous polyps.  Long history of smoking. Over 40 pack year history. Asymptomatic. No prior hepatitis C antibody testing. Low risk. She had bone density about 5 and half years ago. History of osteopenia. High risk for progression to osteoporosis.  Past Medical History:  Diagnosis Date  . Allergy    sinus infections  . Anemia   . Arthritis   . Celiac disease   . GERD (gastroesophageal reflux disease)   . Heart murmur   . Hiatal hernia   . Hyperlipidemia   . Hypertension   . Meniere's disease   . Neuromuscular disorder (HCC)    fibromyalgia  . Thyroid disease    hypothyroid  . 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 CARDIAC CATHETERIZATION N/A 02/25/2015   Procedure: Left Heart Cath and Coronary Angiography;  Surgeon: Peter M Martinique, MD;  Location: [facility];  Service: Cardiovascular;  Laterality: N/A;  . CERVICAL DISC SURGERY    . CHOLECYSTECTOMY    . COLONOSCOPY    . DILATION AND CURETTAGE OF UTERUS    . HAND SURGERY     right hand; calcium deposit on wrist and under thumb and ganglion cyst and carpal tunnel  . lumbar radiofrequency nerve ablation     twice yearly  . MOUTH SURGERY  2016  . ROTATOR CUFF REPAIR  06/07/2015   left shoulder  . TOTAL ABDOMINAL HYSTERECTOMY    . UPPER GASTROINTESTINAL ENDOSCOPY      reports that she has been smoking cigarettes.  She has been smoking about 1.00 pack per day. she has never used smokeless tobacco. She reports that she does not drink alcohol or use drugs. family history includes Breast cancer in her maternal aunt; Celiac disease in her mother and  sister; Diabetes in her mother, sister, and sister; Heart disease in her mother; Meniere's disease in her sister; Prostate cancer in her father. Allergies  Allergen Reactions  . Crestor [Rosuvastatin] Other (See Comments)    Bilateral Leg pain  . Nortriptyline Rash  . Vicodin [Hydrocodone-Acetaminophen] Itching     Review of Systems  Constitutional: Negative for activity change, appetite change, fatigue, fever and unexpected weight change.  HENT: Negative for ear pain, hearing loss, sore throat and trouble swallowing.   Eyes: Negative for visual disturbance.  Respiratory: Negative for cough, chest tightness, shortness of breath and wheezing.   Cardiovascular: Negative for chest pain, palpitations and leg swelling.  Gastrointestinal: Negative for abdominal pain, blood in stool, constipation and diarrhea.  Genitourinary: Negative for dysuria and hematuria.  Musculoskeletal: Positive for back pain. Negative for arthralgias and myalgias.  Skin: Negative for rash.  Neurological: Negative for dizziness, seizures, syncope, weakness, light-headedness and headaches.  Hematological: Negative for adenopathy.  Psychiatric/Behavioral: Negative for confusion and dysphoric mood.       Objective:   Physical Exam  Constitutional: She is oriented to person, place, and time. She appears well-developed and well-nourished.  HENT:  Head: Normocephalic and atraumatic.  Eyes: EOM are normal. Pupils are equal, round, and reactive to light.  Neck: Normal range of motion. Neck supple. No thyromegaly present.  Cardiovascular: Normal  rate, regular rhythm and normal heart sounds.  No murmur heard. Pulmonary/Chest: Breath sounds normal. No respiratory distress. She has no wheezes. She has no rales.  Abdominal: Soft. Bowel sounds are normal. She exhibits no distension and no mass. There is no tenderness. There is no rebound and no guarding.  Musculoskeletal: Normal range of motion. She exhibits no edema.   Lymphadenopathy:    She has no cervical adenopathy.  Neurological: She is alert and oriented to person, place, and time. She displays normal reflexes. No cranial nerve deficit.  Skin: No rash noted.  Psychiatric: She has a normal mood and affect. Her behavior is normal. Judgment and thought content normal.       Assessment:     Physical exam. Several issues addressed as below    Plan:     -Flu vaccine recommended and patient declines -Obtain screening lab work. Include hepatitis C antibody -Discussed referral for low-dose CT lung cancer screening. She does meet criteria in terms of age, smoking status (curent smoker), and pack year history (30+) -Patient strongly encouraged to set up repeat colonoscopy. She is overdue -No indication for Pap smears with previous hysterectomy -Consider new shingles vaccine in 1-2 years -Set up repeat DEXA scan -Patient will get repeat mammography in April -Continue regular weightbearing exercise and daily calcium and vitamin D  Eulas Post MD [facility] at [facility]

## 2017-07-01 LAB — HEPATITIS C ANTIBODY
Hepatitis C Ab: NONREACTIVE
SIGNAL TO CUT-OFF: 0.03 (ref <1.00)

## 2017-07-05 ENCOUNTER — Other Ambulatory Visit: Payer: Self-pay | Admitting: Family Medicine

## 2017-07-10 ENCOUNTER — Telehealth: Payer: Self-pay | Admitting: Acute Care

## 2017-07-10 NOTE — Telephone Encounter (Signed)
Will route to [place] as she normally schedules these.

## 2017-07-10 NOTE — Telephone Encounter (Signed)
Patient called and states she does want to reschedule this but has some other appointments she has to do first.

## 2017-07-11 NOTE — Telephone Encounter (Signed)
Spoke with pt,  She would like to wait a few weeks and then have me call her back to reschedule. Will defer at this time

## 2017-07-12 ENCOUNTER — Ambulatory Visit

## 2017-07-12 ENCOUNTER — Encounter: Admitting: Acute Care

## 2017-07-13 ENCOUNTER — Other Ambulatory Visit: Payer: Self-pay | Admitting: Cardiovascular Disease

## 2017-07-13 ENCOUNTER — Other Ambulatory Visit: Payer: Self-pay | Admitting: Family Medicine

## 2017-07-13 NOTE — Telephone Encounter (Signed)
Please review for refill, Thanks !  

## 2017-07-18 ENCOUNTER — Encounter: Payer: Self-pay | Admitting: Gastroenterology

## 2017-07-21 ENCOUNTER — Other Ambulatory Visit: Payer: Self-pay | Admitting: Family Medicine

## 2017-08-04 ENCOUNTER — Other Ambulatory Visit: Payer: Self-pay | Admitting: Family Medicine

## 2017-08-05 ENCOUNTER — Other Ambulatory Visit: Payer: Self-pay | Admitting: Rheumatology

## 2017-08-07 NOTE — Telephone Encounter (Signed)
Last Visit: 06/14/17 Next Visit: 12/13/17  Okay to refill per Dr. Estanislado Pandy

## 2017-08-08 ENCOUNTER — Other Ambulatory Visit: Payer: Self-pay | Admitting: Family Medicine

## 2017-08-08 DIAGNOSIS — Z1231 Encounter for screening mammogram for malignant neoplasm of breast: Secondary | ICD-10-CM

## 2017-08-09 ENCOUNTER — Telehealth: Payer: Self-pay | Admitting: Acute Care

## 2017-08-10 NOTE — Telephone Encounter (Signed)
Spoke with pt and rescheduled [facility] 10/18/17 9:00 CT will be rescheduled Nothing further needed

## 2017-08-23 ENCOUNTER — Ambulatory Visit (INDEPENDENT_AMBULATORY_CARE_PROVIDER_SITE_OTHER): Admitting: Cardiovascular Disease

## 2017-08-23 ENCOUNTER — Encounter: Payer: Self-pay | Admitting: Cardiovascular Disease

## 2017-08-23 ENCOUNTER — Other Ambulatory Visit: Payer: Self-pay | Admitting: Family Medicine

## 2017-08-23 VITALS — BP 106/72 | HR 56 | Ht 63.0 in | Wt 148.0 lb

## 2017-08-23 DIAGNOSIS — Z720 Tobacco use: Secondary | ICD-10-CM | POA: Diagnosis not present

## 2017-08-23 DIAGNOSIS — E7800 Pure hypercholesterolemia, unspecified: Secondary | ICD-10-CM

## 2017-08-23 DIAGNOSIS — E781 Pure hyperglyceridemia: Secondary | ICD-10-CM

## 2017-08-23 DIAGNOSIS — I10 Essential (primary) hypertension: Secondary | ICD-10-CM

## 2017-08-23 MED ORDER — FENOFIBRATE 145 MG PO TABS: 145.0000 mg | ORAL_TABLET | Freq: Every day | ORAL | 3 refills | Status: DC

## 2017-08-23 MED ORDER — VARENICLINE TARTRATE 0.5 MG X 11 & 1 MG X 42 PO MISC: ORAL | 0 refills | Status: DC

## 2017-08-23 MED ORDER — VARENICLINE TARTRATE 1 MG PO TABS: 1.0000 mg | ORAL_TABLET | Freq: Two times a day (BID) | ORAL | 3 refills | Status: DC

## 2017-08-23 NOTE — Progress Notes (Signed)
Cardiology Office Note   Date:  02/13/15  ID:  Kimberly Stein, DOB [birth year], MRN [id number]  PCP:  Eulas Post, MD  Cardiologist:   Skeet Latch, MD   Chief Complaint  Patient presents with  . Follow-up    no concern, yearly visit     History of Present Illness: Kimberly Stein is a 62 y.o. female with hypertension, hyperlipidemia, hypertriglyceridemia, and tobacco abuse who presents for follow.  Kimberly Stein was seen on 01/2015 after she had an abnormal stress test.  She underwent LHC on 02/25/15, which revealed no coronary artery disease.  She followed up with Kimberly Stein 04/2015 due to myalgias on atorvastatin.  Given her lack of ASCVD she was not a candidate for PCSK9 inhibitors.  However, she was willing to retry rosuvastatin.  She developed myalgias and had to stop it.  Her triglycerides were over 700 so she was started on fenofibrate.  Her labs were recently checked in January and her triglycerides were down to 126.  However her LDL was elevated to 145.  She notes that her diet has been very poor.  She has celiac disease and therefore is limited in what she eats.  She drinks cappuccino some [employer] twice daily.  She also is not exercising.  She is limited by back pain.  She also does not like to exercise when it is cold outside.  She has no chest pain or shortness of breath.  She has not noted any lower extremity edema, orthopnea, or PND.  She plans to quit smoking in May.  She also frequently has cheese and crackers for snack.    Past Medical History:  Diagnosis Date  . Allergy    sinus infections  . Anemia   . Arthritis   . Celiac disease   . GERD (gastroesophageal reflux disease)   . Heart murmur   . Hiatal hernia   . Hyperlipidemia   . Hypertension   . Meniere's disease   . Neuromuscular disorder (HCC)    fibromyalgia  . Thyroid disease    hypothyroid  . 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 CARDIAC CATHETERIZATION N/A 02/25/2015   Procedure: Left Heart Cath and Coronary Angiography;  Surgeon: Peter M Martinique, MD;  Location: [facility];  Service: Cardiovascular;  Laterality: N/A;  . CERVICAL DISC SURGERY    . CHOLECYSTECTOMY    . COLONOSCOPY    . DILATION AND CURETTAGE OF UTERUS    . HAND SURGERY     right hand; calcium deposit on wrist and under thumb and ganglion cyst and carpal tunnel  . lumbar radiofrequency nerve ablation     twice yearly  . MOUTH SURGERY  2016  . ROTATOR CUFF REPAIR  06/07/2015   left shoulder  . TOTAL ABDOMINAL HYSTERECTOMY    . UPPER GASTROINTESTINAL ENDOSCOPY       Current Outpatient Medications  Medication Sig Dispense Refill  . Calcium Carb-Cholecalciferol (CALCIUM 600 + D PO) Take by mouth 2 (two) times daily.    . chlorthalidone (HYGROTON) 25 MG tablet TAKE 1 TABLET BY MOUTH DAILY 30 tablet 5  . cholecalciferol (VITAMIN D) 1000 units tablet Take 1,000 Units by mouth daily.    . cyclobenzaprine (FLEXERIL) 10 MG tablet TAKE 1 TABLET BY MOUTH EVERY NIGHT AT BEDTIME 90 tablet 0  . estradiol (ESTRACE) 1 MG tablet TAKE 1 TABLET BY MOUTH DAILY 90 tablet 0  .  fenofibrate (TRICOR) 145 MG tablet Take 1 tablet (145 mg total) by mouth daily. 90 tablet 3  . gabapentin (NEURONTIN) 300 MG capsule TAKE 2 CAPSULES BY MOUTH THREE TIMES DAILY 180 capsule 0  . levothyroxine (SYNTHROID, LEVOTHROID) 150 MCG tablet TAKE 1 TABLET BY MOUTH EVERY MORNING BEFORE BREAKFAST 90 tablet 0  . losartan (COZAAR) 50 MG tablet TAKE 1 TABLET(50 MG) BY MOUTH DAILY 90 tablet 1  . MAGNESIUM MALATE PO Take by mouth daily.    [name] omeprazole (PRILOSEC) 20 MG capsule TAKE 1 CAPSULE BY MOUTH TWICE DAILY BEFORE A MEAL 60 capsule 0  . potassium chloride (K-DUR,KLOR-CON) 10 MEQ tablet TAKE 1 TABLET BY MOUTH DAILY 90 tablet 2  . PROAIR HFA 108 (90 Base) MCG/ACT inhaler INHALE 2 PUFFS INTO THE LUNGS TWICE DAILY 8.5 g 3  . VOLTAREN 1 % GEL Apply topically as needed. Place on area  3 times daily. Per Neurologist.  Pt now using once daily  3  . varenicline (CHANTIX CONTINUING MONTH PAK) 1 MG tablet Take 1 tablet (1 mg total) by mouth 2 (two) times daily. 60 tablet 3  . varenicline (CHANTIX STARTING MONTH PAK) 0.5 MG X 11 & 1 MG X 42 tablet Take one 0.5 mg tablet by mouth once daily for 3 days, then increase to one 0.5 mg tablet twice daily for 4 days, then increase to one 1 mg tablet twice daily. 53 tablet 0   No current facility-administered medications for this visit.     Allergies:   Crestor [rosuvastatin]; Nortriptyline; Nortriptyline hcl; and Vicodin [hydrocodone-acetaminophen]    Social History:  The patient  reports that she has been smoking cigarettes.  She has been smoking about 1.00 pack per day. She has never used smokeless tobacco. She reports that she does not drink alcohol or use drugs.   Family History:  The patient's family history includes Breast cancer in her maternal aunt; Celiac disease in her mother and sister; Diabetes in her mother, sister, and sister; Heart disease in her mother; Meniere's disease in her sister; Prostate cancer in her father.    ROS:  Please see the history of present illness.   Otherwise, review of systems are positive for back and leg pain.   All other systems are reviewed and negative.    PHYSICAL EXAM: VS:  BP 106/72   Pulse (!) 56   Ht 5' 3"  (1.6 m)   Wt 148 lb (67.1 kg)   BMI 26.22 kg/m  , BMI Body mass index is 26.22 kg/m. GENERAL:  Well appearing HEENT: Pupils equal round and reactive, fundi not visualized, oral mucosa unremarkable NECK:  No jugular venous distention, waveform within normal limits, carotid upstroke brisk and symmetric, no bruits LUNGS:  Clear to auscultation bilaterally HEART:  RRR.  PMI not displaced or sustained,S1 and S2 within normal limits, no S3, no S4, no clicks, no rubs, no murmurs ABD:  Flat, positive bowel sounds normal in frequency in pitch, no bruits, no rebound, no guarding, no midline  pulsatile mass, no hepatomegaly, no splenomegaly EXT:  2 plus pulses throughout, no edema, no cyanosis no clubbing SKIN:  No rashes no nodules NEURO:  Cranial nerves II through XII grossly intact, motor grossly intact throughout PSYCH:  Cognitively intact, oriented to person place and time   EKG:  EKG is ordered today. 04/08/16: Sinus bradycardia. Rate 54 bpm. 08/23/17: Sinus bradycardia.  Rate 56 bpm.  Nonspecific T wave abnormalites  LHC 02/25/15: Dominance: Right   Left Main  The  vessel was injected is normal in caliber is angiographically normal.     Left Anterior Descending  The vessel was injected is normal in caliber . The vessel is tortuous. Minimal calcification in the proximal vessel.     Left Circumflex  The vessel was injected is normal in caliber is angiographically normal.     Right Coronary Artery  The vessel was injected is normal in caliber is angiographically normal. Minimal calcification in the proximal vessel.       Recent Labs: 12/12/2016: Magnesium 1.6 06/30/2017: ALT 16; BUN 22; Creatinine, Ser 0.82; Hemoglobin 13.2; Platelets 216; Potassium 4.0; Sodium 136; TSH 0.43    Lipid Panel    Component Value Date/Time   CHOL 225 (H) 06/30/2017 1148   TRIG 126.0 06/30/2017 1148   HDL 54.40 06/30/2017 1148   CHOLHDL 4 06/30/2017 1148   VLDL 25.2 06/30/2017 1148   LDLCALC 145 (H) 06/30/2017 1148   LDLDIRECT 168 (H) 02/19/2015 0755      Wt Readings from Last 3 Encounters:  08/23/17 148 lb (67.1 kg)  06/30/17 149 lb 14.4 oz (68 kg)  06/14/17 150 lb (68 kg)      Other studies Reviewed: Additional studies/ records that were reviewed today include: . Review of the above records demonstrates:  Please see elsewhere in the note.     ASSESSMENT AND PLAN:  # Hypertension: BP well-controlled. Continue losartan and chlorthalidone.  # Tobacco abuse: Ms. Langone was encouraged to quit smoking. She is ready to quit smoking.  We will give her Chantix which she  will plan to start several days before planning to quit.  She will also use nicotine patches starting at 21 mg for 6 weeks followed by 14 for 2 weeks followed by 7 for 2 weeks.  She understands that she can purchase these over-the-counter.  Discussed for 5 minutes.  # Hyperlipidemia: # Hypertriglyceridemia: Triglycerides are much better but her LDL is very elevated.  She has not tolerated several statins in the past including rosuvastatin and atorvastatin.  She tried another but is unsure what it was.  Given that her diet is so poor she would like to work on this first prior to trying any other medications.  She does not qualify for a PCSK 9 inhibitor due to lack of ASCVD.  Repeat lipids in 6 months.  If they remain elevated at that time consider Zetia.   Current medicines are reviewed at length with the patient today.  The patient does not have concerns regarding medicines.  The following changes have been made:  none Labs/ tests ordered today include:   Orders Placed This Encounter  Procedures  . Lipid panel    Disposition:   FU with Melinda Pottinger C. Oval Linsey, MD in 1 year.  Signed, Skeet Latch, MD  08/23/2017 8:53 AM    [place]

## 2017-08-23 NOTE — Addendum Note (Signed)
Addended by: Alvina Filbert B on: 08/23/2017 02:00 PM   Modules accepted: Orders

## 2017-08-23 NOTE — Patient Instructions (Addendum)
Medication Instructions:  START CHANTIX WHEN YOU DECIDE TO QUIT SMOKING  Labwork: FASTING LP IN 6 MONTHS   Testing/Procedures: NONE  Follow-Up: Your physician wants you to follow-up in: [place] will receive a reminder letter in the mail two months in advance. If you don't receive a letter, please call our office to schedule the follow-up appointment.  If you need a refill on your cardiac medications before your next appointment, please call your pharmacy.

## 2017-08-30 ENCOUNTER — Ambulatory Visit (AMBULATORY_SURGERY_CENTER): Payer: Self-pay

## 2017-08-30 VITALS — Ht 63.0 in | Wt 145.2 lb

## 2017-08-30 DIAGNOSIS — Z86010 Personal history of colonic polyps: Secondary | ICD-10-CM

## 2017-08-30 MED ORDER — NA SULFATE-K SULFATE-MG SULF 17.5-3.13-1.6 GM/177ML PO SOLN: 1.0000 | Freq: Once | ORAL | 0 refills | Status: AC

## 2017-08-30 NOTE — Progress Notes (Signed)
Per pt, no allergies to soy or egg products.Pt not taking any weight loss meds or using  O2 at home.  Pt refused emmi video. 

## 2017-09-04 ENCOUNTER — Other Ambulatory Visit: Payer: Self-pay | Admitting: Gastroenterology

## 2017-09-13 ENCOUNTER — Encounter: Payer: Self-pay | Admitting: Gastroenterology

## 2017-09-13 ENCOUNTER — Ambulatory Visit (AMBULATORY_SURGERY_CENTER): Admitting: Gastroenterology

## 2017-09-13 ENCOUNTER — Other Ambulatory Visit: Payer: Self-pay

## 2017-09-13 VITALS — BP 126/64 | HR 58 | Temp 97.8°F | Resp 14 | Ht 63.0 in | Wt 145.0 lb

## 2017-09-13 DIAGNOSIS — D123 Benign neoplasm of transverse colon: Secondary | ICD-10-CM

## 2017-09-13 DIAGNOSIS — Z86010 Personal history of colonic polyps: Secondary | ICD-10-CM

## 2017-09-13 MED ORDER — SODIUM CHLORIDE 0.9 % IV SOLN: 500.0000 mL | Freq: Once | INTRAVENOUS | Status: DC

## 2017-09-13 NOTE — Progress Notes (Signed)
A and O x3. Report to RN. Tolerated MAC anesthesia well.

## 2017-09-13 NOTE — Patient Instructions (Signed)
HANDOUTS GIVEN FOR POLYPS AND HEMORRHOIDS  YOU HAD AN ENDOSCOPIC PROCEDURE TODAY AT [facility]:   Refer to the procedure report that was given to you for any specific questions about what was found during the examination.  If the procedure report does not answer your questions, please call your gastroenterologist to clarify.  If you requested that your care partner not be given the details of your procedure findings, then the procedure report has been included in a sealed envelope for you to review at your convenience later.  YOU SHOULD EXPECT: Some feelings of bloating in the abdomen. Passage of more gas than usual.  Walking can help get rid of the air that was put into your GI tract during the procedure and reduce the bloating. If you had a lower endoscopy (such as a colonoscopy or flexible sigmoidoscopy) you may notice spotting of blood in your stool or on the toilet paper. If you underwent a bowel prep for your procedure, you may not have a normal bowel movement for a few days.  Please Note:  You might notice some irritation and congestion in your nose or some drainage.  This is from the oxygen used during your procedure.  There is no need for concern and it should clear up in a day or so.  SYMPTOMS TO REPORT IMMEDIATELY:   Following lower endoscopy (colonoscopy or flexible sigmoidoscopy):  Excessive amounts of blood in the stool  Significant tenderness or worsening of abdominal pains  Swelling of the abdomen that is new, acute  Fever of 100F or higher   For urgent or emergent issues, a gastroenterologist can be reached at any hour by calling [phone].   DIET:  We do recommend a small meal at first, but then you may proceed to your regular diet.  Drink plenty of fluids but you should avoid alcoholic beverages for 24 hours.  ACTIVITY:  You should plan to take it easy for the rest of today and you should NOT DRIVE or use heavy machinery until tomorrow (because of the  sedation medicines used during the test).    FOLLOW UP: Our staff will call the number listed on your records the next business day following your procedure to check on you and address any questions or concerns that you may have regarding the information given to you following your procedure. If we do not reach you, we will leave a message.  However, if you are feeling well and you are not experiencing any problems, there is no need to return our call.  We will assume that you have returned to your regular daily activities without incident.  If any biopsies were taken you will be contacted by phone or by letter within the next 1-3 weeks.  Please call us at [phone] if you have not heard about the biopsies in 3 weeks.    SIGNATURES/CONFIDENTIALITY: You and/or your care partner have signed paperwork which will be entered into your electronic medical record.  These signatures attest to the fact that that the information above on your After Visit Summary has been reviewed and is understood.  Full responsibility of the confidentiality of this discharge information lies with you and/or your care-partner.

## 2017-09-13 NOTE — Progress Notes (Signed)
No change in medical, surgical, or medications history since visit on 08/30/17.

## 2017-09-13 NOTE — Progress Notes (Signed)
Called to room to assist during endoscopic procedure.  Patient ID and intended procedure confirmed with present staff. Received instructions for my participation in the procedure from the performing physician.  

## 2017-09-13 NOTE — Op Note (Signed)
[facility] Patient Name: Kimberly Stein Procedure Date: 09/13/2017 8:24 AM MRN: [id number] Endoscopist: Ladene Artist , MD Age: 62 Referring MD:  Date of Birth: [birth year] Gender: Female Account #: [id number] Procedure:                Colonoscopy Indications:              Surveillance: Personal history of adenomatous                            polyps on last colonoscopy > 5 years ago Medicines:                Monitored Anesthesia Care Procedure:                Pre-Anesthesia Assessment:                           - Prior to the procedure, a History and Physical                            was performed, and patient medications and                            allergies were reviewed. The patient's tolerance of                            previous anesthesia was also reviewed. The risks                            and benefits of the procedure and the sedation                            options and risks were discussed with the patient.                            All questions were answered, and informed consent                            was obtained. Prior Anticoagulants: The patient has                            taken no previous anticoagulant or antiplatelet                            agents. ASA Grade Assessment: II - A patient with                            mild systemic disease. After reviewing the risks                            and benefits, the patient was deemed in                            satisfactory condition to undergo the procedure.  After obtaining informed consent, the colonoscope                            was passed under direct vision. Throughout the                            procedure, the patient's blood pressure, pulse, and                            oxygen saturations were monitored continuously. The                            Colonoscope was introduced through the anus and                            advanced to the the  cecum, identified by                            appendiceal orifice and ileocecal valve. The                            ileocecal valve, appendiceal orifice, and rectum                            were photographed. The quality of the bowel                            preparation was excellent. The colonoscopy was                            performed without difficulty. The patient tolerated                            the procedure well. Scope In: 8:32:59 AM Scope Out: 8:48:37 AM Scope Withdrawal Time: 0 hours 12 minutes 28 seconds  Total Procedure Duration: 0 hours 15 minutes 38 seconds  Findings:                 The perianal and digital rectal examinations were                            normal.                           A 7 mm polyp was found in the hepatic flexure. The                            polyp was sessile. The polyp was removed with a                            cold snare. Resection and retrieval were complete.                           Two sessile polyps were found in the transverse  colon. The polyps were 3 to 5 mm in size. These                            polyps were removed with a cold biopsy forceps.                            Resection and retrieval were complete.                           Internal hemorrhoids were found during                            retroflexion. The hemorrhoids were small and Grade                            I (internal hemorrhoids that do not prolapse).                           The exam was otherwise without abnormality on                            direct and retroflexion views. Complications:            No immediate complications. Estimated blood loss:                            None. Estimated Blood Loss:     Estimated blood loss: none. Impression:               - One 7 mm polyp at the hepatic flexure, removed                            with a cold snare. Resected and retrieved.                           - Two 3 to 5  mm polyps in the transverse colon,                            removed with a cold biopsy forceps. Resected and                            retrieved.                           - Internal hemorrhoids.                           - The examination was otherwise normal on direct                            and retroflexion views. Recommendation:           - Repeat colonoscopy in 3 - 5 years for                            surveillance pending  pathology review.                           - Patient has a contact number available for                            emergencies. The signs and symptoms of potential                            delayed complications were discussed with the                            patient. Return to normal activities tomorrow.                            Written discharge instructions were provided to the                            patient.                           - Resume previous diet.                           - Continue present medications.                           - Await pathology results. Ladene Artist, MD 09/13/2017 8:51:52 AM This report has been signed electronically.

## 2017-09-14 ENCOUNTER — Telehealth: Payer: Self-pay

## 2017-09-14 NOTE — Telephone Encounter (Signed)
Attempted to reach pt. With follow-up call following endoscopic procedure 09/13/2017.  LM on pt. Voice mail to call if she has any questions or concerns.

## 2017-09-14 NOTE — Telephone Encounter (Signed)
Attempted to reach pt. With follow-up call following endoscopic procedure 09/13/2017.  LM on pt. Ans. Machine.  Will try to reach pt. Again later today.

## 2017-09-19 ENCOUNTER — Ambulatory Visit

## 2017-09-24 ENCOUNTER — Encounter: Payer: Self-pay | Admitting: Gastroenterology

## 2017-10-10 ENCOUNTER — Ambulatory Visit
Admission: RE | Admit: 2017-10-10 | Discharge: 2017-10-10 | Disposition: A | Discharge status: Home / Self Care | Source: Ambulatory Visit | Attending: Family Medicine | Admitting: Family Medicine

## 2017-10-10 DIAGNOSIS — Z1231 Encounter for screening mammogram for malignant neoplasm of breast: Secondary | ICD-10-CM

## 2017-10-18 ENCOUNTER — Encounter: Admitting: Acute Care

## 2017-10-18 ENCOUNTER — Inpatient Hospital Stay: Admission: RE | Admit: 2017-10-18 | Source: Ambulatory Visit

## 2017-10-19 ENCOUNTER — Other Ambulatory Visit: Payer: Self-pay | Admitting: Family Medicine

## 2017-11-02 ENCOUNTER — Other Ambulatory Visit: Payer: Self-pay | Admitting: Rheumatology

## 2017-11-02 NOTE — Telephone Encounter (Signed)
Last Visit: 06/14/17 Next Visit: 12/13/17  Okay to refill per Dr. Estanislado Pandy

## 2017-11-29 NOTE — Progress Notes (Signed)
Office Visit Note  Patient: Kimberly Stein             Date of Birth: August 04, 1955           MRN: [id number]             PCP: Eulas Post, MD Referring: Eulas Post, MD Visit Date: 12/13/2017 Occupation: @GUAROCC @  Subjective:  Generalized pain   History of Present Illness: Kimberly Stein is a 62 y.o. female with history of fibromyalgia, osteoarthritis, and DDD.  Patient takes gabapentin 300 mg 2 capsules by mouth 3 times daily and Flexeril 10 mg 1 tablet by mouth every night for muscle spasms.  She reports that she had a spinal nerve ablation on 11/10/2017 and had a fibromyalgia flare following the procedure.  She states that she has not had any recent flares since then.  She continues to have generalized muscle aches and muscle tenderness but it has been manageable.  She states that her fatigue continues to be chronic but if she gets in a good routine she is able to do everything that she needs to get done.  She continues to have interrupted sleep at night and wakes up several times if she is lying on her left side which provokes increased pain in her left trochanteric bursa.  She did a cortisone injection in the past which did not provide relief.  She denies any groin pain.  She states that her neck has been doing well but continues to have neck stiffness.  She states her bilateral shoulders are doing well overall and range of motion is a stable.  She states she has mild hand pain but no joint swelling.  She states she no longer uses Voltaren gel due to not feeling as though it provides much relief.    Activities of Daily Living:  Patient reports morning stiffness for 0 minutes.   Patient Reports nocturnal pain.  Difficulty dressing/grooming: Denies Difficulty climbing stairs: Denies Difficulty getting out of chair: Denies Difficulty using hands for taps, buttons, cutlery, and/or writing: Denies  Review of Systems  Constitutional: Negative for fatigue.  HENT: Positive for  mouth dryness. Negative for mouth sores, trouble swallowing, trouble swallowing and nose dryness.   Eyes: Negative for pain, itching, visual disturbance and dryness.  Respiratory: Negative for cough, hemoptysis, shortness of breath and difficulty breathing.   Cardiovascular: Negative for chest pain, palpitations, hypertension and swelling in legs/feet.  Gastrointestinal: Negative for abdominal pain, blood in stool, constipation and diarrhea.  Endocrine: Negative for increased urination.  Genitourinary: Negative for painful urination and pelvic pain.  Musculoskeletal: Negative for arthralgias, joint pain, joint swelling, myalgias, muscle weakness, morning stiffness, muscle tenderness and myalgias.  Skin: Negative for color change, pallor, rash, hair loss, nodules/bumps, skin tightness, ulcers and sensitivity to sunlight.  Allergic/Immunologic: Negative for susceptible to infections.  Neurological: Negative for dizziness, light-headedness, numbness, headaches, memory loss and weakness.  Hematological: Negative for bruising/bleeding tendency and swollen glands.  Psychiatric/Behavioral: Negative for depressed mood, confusion and sleep disturbance. The patient is not nervous/anxious.     PMFS History:  Patient Active Problem List   Diagnosis Date Noted  . Other fatigue 06/13/2016  . Primary insomnia 06/13/2016  . Primary osteoarthritis of both hands 06/13/2016  . Trochanteric bursitis of both hips 06/13/2016  . DDD cervical spine status post fusion 06/13/2016  . Osteoarthritis of lumbar spine 06/13/2016  . Tendinopathy of right rotator cuff 06/13/2016  . Chest pain 02/25/2015  . Abnormal nuclear stress  test 02/25/2015  . Hyperlipidemia 07/17/2013  . Osteopenia 01/28/2012  . 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 CELIAC SPRUE 09/29/2009  . Hypothyroidism 09/07/2009  . Meniere's disease 09/07/2009  . Essential hypertension 09/07/2009  . RUQ PAIN 09/07/2009  . Fibromyalgia 06/12/2009  .  CERVICALGIA 06/03/2009  . ANEMIA-IRON DEFICIENCY 10/21/2008  . GERD 10/21/2008  . DIZZINESS 10/21/2008  . COLONIC POLYPS, HX OF 10/21/2008    Past Medical History:  Diagnosis Date  . Allergy    sinus infections  . Anemia   . Arthritis   . Celiac disease   . GERD (gastroesophageal reflux disease)   . Heart murmur   . Hiatal hernia   . Hyperlipidemia   . Hypertension   . Meniere's disease   . Neuromuscular disorder (HCC)    fibromyalgia  . Thyroid disease    hypothyroid  . 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 Celiac disease Mother   . Diabetes Mother   . Heart disease Mother   . Prostate cancer Father   . Breast cancer Maternal Aunt   . Diabetes Sister   . Meniere's disease Sister   . Celiac disease Sister   . Diabetes Sister   . Colon cancer Neg Hx   . Esophageal cancer Neg Hx   . Stomach cancer Neg Hx   . Rectal cancer Neg Hx   . Liver cancer Neg Hx   . Pancreatic cancer Neg Hx    Past Surgical History:  Procedure Laterality Date  . CARDIAC CATHETERIZATION N/A 02/25/2015   Procedure: Left Heart Cath and Coronary Angiography;  Surgeon: Peter M Martinique, MD;  Location: [facility];  Service: Cardiovascular;  Laterality: N/A;  . CERVICAL DISC SURGERY    . CHOLECYSTECTOMY    . COLONOSCOPY    . DILATION AND CURETTAGE OF UTERUS    . HAND SURGERY     right hand; calcium deposit on wrist and under thumb and ganglion cyst and carpal tunnel  . lumbar radiofrequency nerve ablation     twice yearly  . MOUTH SURGERY  2016   couple times due to gum disease  . ROTATOR CUFF REPAIR  06/07/2015   left shoulder  . TOTAL ABDOMINAL HYSTERECTOMY    . UPPER GASTROINTESTINAL ENDOSCOPY     Social History   Social History Narrative  . Not on file    Objective: Vital Signs: BP 125/72 (BP Location: Left Arm, Patient Position: Sitting, Cuff Size: Normal)   Pulse 64   Resp 14   Ht 5' 3"  (1.6 m)   Wt 149 lb (67.6 kg)    BMI 26.39 kg/m    Physical Exam  Constitutional: She is oriented to person, place, and time. She appears well-developed and well-nourished.  HENT:  Head: Normocephalic and atraumatic.  Eyes: Conjunctivae and EOM are normal.  Neck: Normal range of motion.  Cardiovascular: Normal rate, regular rhythm, normal heart sounds and intact distal pulses.  Pulmonary/Chest: Effort normal and breath sounds normal.  Abdominal: Soft. Bowel sounds are normal.  Lymphadenopathy:    She has no cervical adenopathy.  Neurological: She is alert and oriented to person, place, and time.  Skin: Skin is warm and dry. Capillary refill takes less than 2 seconds.  Psychiatric: She has a normal mood and affect. Her behavior is normal.  Nursing note and vitals reviewed.    Musculoskeletal Exam: C-spine slightly limited range of motion with lateral rotation.  Thoracic and lumbar  spine good range of motion.  No midline spinal tenderness.  No SI joint tenderness.  Shoulder joints abduction to 90 degrees with discomfort.  Elbow joints, wrist joints, MCPs, PIPs, DIPs good range of motion with no synovitis.  PIP and DIP synovial thickening consistent with osteoarthritis of bilateral hands.  Hip joints, knee joints, ankle joints, MTPs, PIPs, DIPs good range of motion with no synovitis.  No warmth or effusion of bilateral knee joints.  No knee crepitus.  She has tenderness of the left trochanteric bursa on exam.  CDAI Exam: No CDAI exam completed.   Investigation: No additional findings. CBC Latest Ref Rng & Units 06/30/2017 02/19/2015 09/13/2013  WBC 3.8 - 10.8 Thousand/uL 7.0 8.1 8.6  Hemoglobin 11.7 - 15.5 g/dL 13.2 14.1 13.0  Hematocrit 35.0 - 45.0 % 37.6 39.9 39.2  Platelets 140 - 400 Thousand/uL 216 214 190.0   CMP Latest Ref Rng & Units 06/30/2017 12/12/2016 02/29/2016  Glucose 70 - 99 mg/dL 98 96 92  BUN 6 - 23 mg/dL 22 21 14   Creatinine 0.40 - 1.20 mg/dL 0.82 0.86 0.75  Sodium 135 - 145 mEq/L 136 139 140    Potassium 3.5 - 5.1 mEq/L 4.0 4.9 3.8  Chloride 96 - 112 mEq/L 101 102 101  CO2 19 - 32 mEq/L 32 28 30  Calcium 8.4 - 10.5 mg/dL 10.1 10.5(H) 10.5  Total Protein 6.0 - 8.3 g/dL 7.4 7.4 7.2  Total Bilirubin 0.2 - 1.2 mg/dL 0.5 0.4 0.3  Alkaline Phos 39 - 117 U/L 25(L) 29(L) 28(L)  AST 0 - 37 U/L 19 17 34  ALT 0 - 35 U/L 16 16 26     Imaging: No results found.  Recent Labs: Lab Results  Component Value Date   WBC 7.0 06/30/2017   HGB 13.2 06/30/2017   PLT 216 06/30/2017   NA 136 06/30/2017   K 4.0 06/30/2017   CL 101 06/30/2017   CO2 32 06/30/2017   GLUCOSE 98 06/30/2017   BUN 22 06/30/2017   CREATININE 0.82 06/30/2017   BILITOT 0.5 06/30/2017   ALKPHOS 25 (L) 06/30/2017   AST 19 06/30/2017   ALT 16 06/30/2017   PROT 7.4 06/30/2017   ALBUMIN 4.5 06/30/2017   CALCIUM 10.1 06/30/2017   GFRAA 85 12/12/2016    Speciality Comments: No specialty comments available.  Procedures:  No procedures performed Allergies: Crestor [rosuvastatin]; Nortriptyline; Nortriptyline hcl; and Vicodin [hydrocodone-acetaminophen]   Assessment / Plan:     Visit Diagnoses: Fibromyalgia: She continues to have generalized muscle aches and muscle tenderness due to fibromyalgia.  She had a flare following a spinal nerve ablation on 11/10/2017. Fibromyalgia has been well controlled since.  She continues to have chronic fatigue and insomnia.  She is interrupted sleep at night due to pain when lying on her left side.  She continues to have left trochanteric bursitis.  She did cortisone injection in the past which did not provide pain relief.  She states she is no longer using Voltaren gel due to not getting any relief.  She continues to take gabapentin 300 mg 2 capsules by mouth 3 times daily and Flexeril 10 mg by mouth 1 tablet at bedtime for muscle spasms.  She was encouraged to continue to stay active and exercise on a regular basis.  She no longer has a sample at home was encouraged to try to perform light  exercises regularly.  She does not need any refills at this time.  Primary osteoarthritis of both hands: She has  PIP and DIP synovial thickening consistent with osteoarthritis of bilateral hands.  She has been having increased pain in her right third DIP joint.  She has no synovitis on exam.  She is complete fist formation bilaterally.  Joint protection and muscle strengthening were discussed.  Trochanteric bursitis of both hips: She continues to have tenderness of the left trochanteric bursa on exam.  She did a cortisone injection in the past which did not provide relief.  She perform stretching exercises on a regular basis.  Tendinopathy of right rotator cuff: She has limited shoulder abduction to about 90 degrees bilaterally.  She has no pain at this time.  DDD (degenerative disc disease), cervical: C-spine slightly limited range of motion with lateral rotation.  She continues to have neck stiffness but no discomfort at this time.  DDD (degenerative disc disease), lumbar: She has no midline spinal tenderness.  She has a nerve ablation on 11/10/17.  She ranks her lower back pain a 3 out of 10 at this time.  She had a fibromyalgia flare following the procedure of fibromyalgia has been better controlled.  Primary insomnia: She takes Flexeril 10 mg 1 tablet at bedtime for muscle spasms.  She continues to have interrupted sleep at night due to left trochanteric bursitis.  Good sleep hygiene was discussed.  Other fatigue: Chronic but manageable.  She states that she has a good routine during the day she is able to get everything that she needs to done.  Osteopenia of multiple sites - 02/09/2016 DXA normal T -0.9  Other medical conditions are listed as follows:   History of hypothyroidism  History of hyperlipidemia  History of gastroesophageal reflux (GERD)  History of anemia  History of hypertension  History of Meniere's disease   Orders: No orders of the defined types were placed in  this encounter.  No orders of the defined types were placed in this encounter.     Follow-Up Instructions: Return in about 6 months (around 06/15/2018) for Fibromyalgia, Osteoarthritis, DDD.   Ofilia Neas, PA-C   I examined and evaluated the patient with Hazel Sams PA.  Patient is clinically feeling much better now.  She continues to have some discomfort due to underlying osteoarthritis.  The plan of care was discussed as noted above.  Bo Merino, MD  Note - This record has been created using Editor, commissioning.  Chart creation errors have been sought, but may not always  have been located. Such creation errors do not reflect on  the standard of medical care.

## 2017-12-13 ENCOUNTER — Ambulatory Visit (INDEPENDENT_AMBULATORY_CARE_PROVIDER_SITE_OTHER): Admitting: Rheumatology

## 2017-12-13 ENCOUNTER — Encounter: Payer: Self-pay | Admitting: Rheumatology

## 2017-12-13 VITALS — BP 125/72 | HR 64 | Resp 14 | Ht 63.0 in | Wt 149.0 lb

## 2017-12-13 DIAGNOSIS — M19042 Primary osteoarthritis, left hand: Secondary | ICD-10-CM

## 2017-12-13 DIAGNOSIS — M19041 Primary osteoarthritis, right hand: Secondary | ICD-10-CM | POA: Diagnosis not present

## 2017-12-13 DIAGNOSIS — M7062 Trochanteric bursitis, left hip: Secondary | ICD-10-CM

## 2017-12-13 DIAGNOSIS — M5136 Other intervertebral disc degeneration, lumbar region: Secondary | ICD-10-CM

## 2017-12-13 DIAGNOSIS — M7061 Trochanteric bursitis, right hip: Secondary | ICD-10-CM | POA: Diagnosis not present

## 2017-12-13 DIAGNOSIS — M67911 Unspecified disorder of synovium and tendon, right shoulder: Secondary | ICD-10-CM | POA: Diagnosis not present

## 2017-12-13 DIAGNOSIS — M797 Fibromyalgia: Secondary | ICD-10-CM

## 2017-12-13 DIAGNOSIS — M8589 Other specified disorders of bone density and structure, multiple sites: Secondary | ICD-10-CM

## 2017-12-13 DIAGNOSIS — M5030 Other cervical disc degeneration, unspecified cervical region: Secondary | ICD-10-CM

## 2017-12-13 DIAGNOSIS — F5101 Primary insomnia: Secondary | ICD-10-CM

## 2017-12-13 DIAGNOSIS — Z8669 Personal history of other diseases of the nervous system and sense organs: Secondary | ICD-10-CM

## 2017-12-13 DIAGNOSIS — R5383 Other fatigue: Secondary | ICD-10-CM

## 2017-12-13 DIAGNOSIS — Z862 Personal history of diseases of the blood and blood-forming organs and certain disorders involving the immune mechanism: Secondary | ICD-10-CM

## 2017-12-13 DIAGNOSIS — Z8679 Personal history of other diseases of the circulatory system: Secondary | ICD-10-CM

## 2017-12-13 DIAGNOSIS — Z8719 Personal history of other diseases of the digestive system: Secondary | ICD-10-CM

## 2017-12-13 DIAGNOSIS — M51369 Other intervertebral disc degeneration, lumbar region without mention of lumbar back pain or lower extremity pain: Secondary | ICD-10-CM

## 2017-12-13 DIAGNOSIS — Z8639 Personal history of other endocrine, nutritional and metabolic disease: Secondary | ICD-10-CM

## 2017-12-19 ENCOUNTER — Other Ambulatory Visit: Payer: Self-pay | Admitting: Family Medicine

## 2018-01-24 ENCOUNTER — Ambulatory Visit (INDEPENDENT_AMBULATORY_CARE_PROVIDER_SITE_OTHER)

## 2018-01-24 ENCOUNTER — Other Ambulatory Visit: Payer: Self-pay | Admitting: Family Medicine

## 2018-01-24 ENCOUNTER — Encounter (INDEPENDENT_AMBULATORY_CARE_PROVIDER_SITE_OTHER): Payer: Self-pay | Admitting: Orthopaedic Surgery

## 2018-01-24 ENCOUNTER — Ambulatory Visit (INDEPENDENT_AMBULATORY_CARE_PROVIDER_SITE_OTHER): Admitting: Orthopaedic Surgery

## 2018-01-24 ENCOUNTER — Other Ambulatory Visit (INDEPENDENT_AMBULATORY_CARE_PROVIDER_SITE_OTHER): Payer: Self-pay | Admitting: Radiology

## 2018-01-24 VITALS — BP 125/83 | HR 97 | Ht 63.0 in | Wt 150.0 lb

## 2018-01-24 DIAGNOSIS — G8929 Other chronic pain: Secondary | ICD-10-CM

## 2018-01-24 DIAGNOSIS — M545 Low back pain: Secondary | ICD-10-CM | POA: Diagnosis not present

## 2018-01-24 MED ORDER — OXYCODONE HCL 5 MG PO CAPS: ORAL_CAPSULE | ORAL | 0 refills | Status: DC

## 2018-01-24 NOTE — Progress Notes (Signed)
Office Visit Note   Patient: Kimberly Stein           Date of Birth: Dec 31, 1955           MRN: [id number] Visit Date: 01/24/2018              Requested by: Kimberly Post, MD [place], [place] [address] PCP: Kimberly Post, MD   Assessment & Plan: Visit Diagnoses:  1. Chronic midline low back pain, with sciatica presence unspecified     Plan: Acute onset of low back pain without radiculopathy.  Appears to be an exacerbation of pre-existing degenerative changes of lumbar spine.  Will treat with lumbar support that she has at home.  We will give her a prescription for OxyIR 10 mg #20 and have her follow-up with your Kimberly Stein, her neurosurgeon, and Dr. Maryjean Stein. does have Flexeril at home for her fibromyalgia that she can also use  Follow-Up Instructions: Return if symptoms worsen or fail to improve.   Orders:  Orders Placed This Encounter  Procedures  . XR Lumbar Spine 2-3 Views   No orders of the defined types were placed in this encounter.     Procedures: No procedures performed   Clinical Data: No additional findings.   Subjective: Chief Complaint  Patient presents with  . New Patient (Initial Visit)    PT HAS CHRONIC LOW BACK PAIN GETS RADIO NERVE ABLATION EVERY 6 MO FORLAST 3 YRS, LAST ONE 11/10/17. 01/20/18 PT WAS VACUMMING  AND FELT "SOMETHING HAPPEN" AND COULDNT STAND UP STRAIGHT. SYMPTOMS ARE IMPROVING USING ICE/HEAT AND Kimberly Stein experienced acute onset of low back pain after vacuuming over the weekend.  She is a little better now but for the last several days she is had difficulty extending her back.  She has a chronic history of back problems dating back well over 10 years.  She has had MRI scans and CT myelograms.  She is being followed by Dr. Ronnald Stein of neurosurgery and Dr. Maryjean Stein for injections.  Injection was in the last several months which gives her temporary relief of her pain.  She also takes oxycodone as necessary.  She does  see Kimberly Stein for fibromyalgia.  He is better now that she was even yesterday.  She still has difficulty extending but does not have any pain referred to her lower extremities.  She has not had any numbness or tingling.  She also denies any bowel or bladder changes  HPI  Review of Systems  Constitutional: Positive for fatigue. Negative for fever.  HENT: Negative for ear pain.   Eyes: Negative for pain.  Respiratory: Negative for cough.   Cardiovascular: Negative for leg swelling.  Gastrointestinal: Negative for constipation and diarrhea.  Genitourinary: Negative for difficulty urinating.  Musculoskeletal: Positive for back pain. Negative for neck pain.  Skin: Negative for rash.  Allergic/Immunologic: Negative for food allergies.  Neurological: Positive for weakness. Negative for numbness.  Hematological: Does not bruise/bleed easily.  Psychiatric/Behavioral: Positive for sleep disturbance.     Objective: Vital Signs: BP 125/83 (BP Location: Left Arm, Patient Position: Sitting, Cuff Size: Normal)   Pulse 97   Ht 5' 3"  (1.6 m)   Wt 150 lb (68 kg)   BMI 26.57 kg/m   Physical Exam  Constitutional: She is oriented to person, place, and time. She appears well-developed and well-nourished.  HENT:  Mouth/Throat: Oropharynx is clear and moist.  Eyes: Pupils are equal, round, and reactive to light. EOM are  normal.  Pulmonary/Chest: Effort normal.  Neurological: She is alert and oriented to person, place, and time.  Skin: Skin is warm and dry.  Psychiatric: She has a normal mood and affect. Her behavior is normal.    Ortho Exam having some difficulty extending her back related to pain.  When we applied her back support she was definitely better.  Straight leg raise was positive only for pain in her back.  No pain referred to either buttock or either lower extremity.  Neurovascular exam intact.  Painless range of motion both hips and both knees.  Positive percussible tenderness of the  lower lumbar spine diffusely  Specialty Comments:  No specialty comments available.  Imaging: Xr Lumbar Spine 2-3 Views  Result Date: 01/24/2018 Films of the lumbar spine were obtained in 2 projections.  There is considerable degenerative disc disease at L4-5 and even greater at L5-S1 with little if any disc space remaining.  No acute changes.  No evidence of a fracture or listhesis.  No loss of lumbar lordosis.    PMFS History: Patient Active Problem List   Diagnosis Date Noted  . Other fatigue 06/13/2016  . Primary insomnia 06/13/2016  . Primary osteoarthritis of both hands 06/13/2016  . Trochanteric bursitis of both hips 06/13/2016  . DDD cervical spine status Stein fusion 06/13/2016  . Osteoarthritis of lumbar spine 06/13/2016  . Tendinopathy of right rotator cuff 06/13/2016  . Chest pain 02/25/2015  . Abnormal nuclear stress test 02/25/2015  . Hyperlipidemia 07/17/2013  . Osteopenia 01/28/2012  . 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 CELIAC SPRUE 09/29/2009  . Hypothyroidism 09/07/2009  . Meniere's disease 09/07/2009  . Essential hypertension 09/07/2009  . RUQ PAIN 09/07/2009  . Fibromyalgia 06/12/2009  . CERVICALGIA 06/03/2009  . ANEMIA-IRON DEFICIENCY 10/21/2008  . GERD 10/21/2008  . DIZZINESS 10/21/2008  . COLONIC POLYPS, HX OF 10/21/2008   Past Medical History:  Diagnosis Date  . Allergy    sinus infections  . Anemia   . Arthritis   . Celiac disease   . GERD (gastroesophageal reflux disease)   . Heart murmur   . Hiatal hernia   . Hyperlipidemia   . Hypertension   . Meniere's disease   . Neuromuscular disorder (HCC)    fibromyalgia  . Thyroid disease    hypothyroid  . 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 Celiac disease Mother   . Diabetes Mother   . Heart disease Mother   . Prostate cancer Father   . Breast cancer Maternal Aunt   . Diabetes Sister   . Meniere's disease Sister   . Celiac disease  Sister   . Diabetes Sister   . Colon cancer Neg Hx   . Esophageal cancer Neg Hx   . Stomach cancer Neg Hx   . Rectal cancer Neg Hx   . Liver cancer Neg Hx   . Pancreatic cancer Neg Hx     Past Surgical History:  Procedure Laterality Date  . CARDIAC CATHETERIZATION N/A 02/25/2015   Procedure: Left Heart Cath and Coronary Angiography;  Surgeon: Collette Pescador M Martinique, MD;  Location: [facility];  Service: Cardiovascular;  Laterality: N/A;  . CERVICAL DISC SURGERY    . CHOLECYSTECTOMY    . COLONOSCOPY    . DILATION AND CURETTAGE OF UTERUS    . HAND SURGERY     right hand; calcium deposit on wrist and under thumb and ganglion cyst and carpal tunnel  .  lumbar radiofrequency nerve ablation     twice yearly  . MOUTH SURGERY  2016   couple times due to gum disease  . ROTATOR CUFF REPAIR  06/07/2015   left shoulder  . TOTAL ABDOMINAL HYSTERECTOMY    . UPPER GASTROINTESTINAL ENDOSCOPY     Social History   Occupational History  . Occupation: Housewife  Tobacco Use  . Smoking status: Current Every Day Smoker    Packs/day: 1.00    Types: Cigarettes  . Smokeless tobacco: Never Used  Substance and Sexual Activity  . Alcohol use: No    Alcohol/week: 0.0 standard drinks  . Drug use: No  . Sexual activity: Not on file

## 2018-01-26 ENCOUNTER — Other Ambulatory Visit: Payer: Self-pay | Admitting: Rheumatology

## 2018-01-26 NOTE — Telephone Encounter (Signed)
Last visit: 12/13/17 Next Visit: 06/18/18   Okay to refill per Dr. Estanislado Pandy

## 2018-02-18 ENCOUNTER — Other Ambulatory Visit: Payer: Self-pay | Admitting: Family Medicine

## 2018-04-02 ENCOUNTER — Other Ambulatory Visit: Payer: Self-pay | Admitting: Family Medicine

## 2018-04-18 ENCOUNTER — Other Ambulatory Visit: Payer: Self-pay | Admitting: Family Medicine

## 2018-04-25 ENCOUNTER — Telehealth: Payer: Self-pay | Admitting: Rheumatology

## 2018-04-25 ENCOUNTER — Other Ambulatory Visit: Payer: Self-pay | Admitting: Family Medicine

## 2018-04-25 MED ORDER — CYCLOBENZAPRINE HCL 10 MG PO TABS: 10.0000 mg | ORAL_TABLET | Freq: Every day | ORAL | 0 refills | Status: DC

## 2018-04-25 NOTE — Telephone Encounter (Signed)
error 

## 2018-04-25 NOTE — Telephone Encounter (Signed)
Patient advised prescription has been sent to the pharmacy.  

## 2018-04-25 NOTE — Telephone Encounter (Signed)
Patient called requesting prescription refill of Flexeril to be sent to [facility] in [place].  Patient is requesting she pick up the prescription today because she is going out of town and leaving tomorrow morning.  Patient states she only has 2 pills remaining and the pharmacist said they need to hear from Dr. Estanislado Pandy in order to refill the prescription 2 days early.  Patient requested a return call.

## 2018-04-25 NOTE — Telephone Encounter (Signed)
Pharmacy calling to notify Dr. Elease Hashimoto that the patient is going out of town tomorrow.

## 2018-04-25 NOTE — Telephone Encounter (Signed)
Last visit: 12/13/17 Next Visit: 06/18/18   Okay to refill per Dr. Estanislado Pandy

## 2018-05-23 ENCOUNTER — Other Ambulatory Visit: Payer: Self-pay | Admitting: Family Medicine

## 2018-06-04 NOTE — Progress Notes (Deleted)
Office Visit Note  Patient: Kimberly Stein             Date of Birth: Oct 08, 1955           MRN: [id number]             PCP: Eulas Post, MD Referring: Eulas Post, MD Visit Date: 06/18/2018 Occupation: @GUAROCC @  Subjective:  No chief complaint on file.   History of Present Illness: Kimberly Stein is a 63 y.o. female ***   Activities of Daily Living:  Patient reports morning stiffness for *** {minute/hour:19697}.   Patient {ACTIONS;DENIES/REPORTS:21021675::"Denies"} nocturnal pain.  Difficulty dressing/grooming: {ACTIONS;DENIES/REPORTS:21021675::"Denies"} Difficulty climbing stairs: {ACTIONS;DENIES/REPORTS:21021675::"Denies"} Difficulty getting out of chair: {ACTIONS;DENIES/REPORTS:21021675::"Denies"} Difficulty using hands for taps, buttons, cutlery, and/or writing: {ACTIONS;DENIES/REPORTS:21021675::"Denies"}  No Rheumatology ROS completed.   PMFS History:  Patient Active Problem List   Diagnosis Date Noted  . Other fatigue 06/13/2016  . Primary insomnia 06/13/2016  . Primary osteoarthritis of both hands 06/13/2016  . Trochanteric bursitis of both hips 06/13/2016  . DDD cervical spine status post fusion 06/13/2016  . Osteoarthritis of lumbar spine 06/13/2016  . Tendinopathy of right rotator cuff 06/13/2016  . Chest pain 02/25/2015  . Abnormal nuclear stress test 02/25/2015  . Hyperlipidemia 07/17/2013  . Osteopenia 01/28/2012  . 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 CELIAC SPRUE 09/29/2009  . Hypothyroidism 09/07/2009  . Meniere's disease 09/07/2009  . Essential hypertension 09/07/2009  . RUQ PAIN 09/07/2009  . Fibromyalgia 06/12/2009  . CERVICALGIA 06/03/2009  . ANEMIA-IRON DEFICIENCY 10/21/2008  . GERD 10/21/2008  . DIZZINESS 10/21/2008  . COLONIC POLYPS, HX OF 10/21/2008    Past Medical History:  Diagnosis Date  . Allergy    sinus infections  . Anemia   . Arthritis   . Celiac disease   . GERD (gastroesophageal reflux disease)   . Heart  murmur   . Hiatal hernia   . Hyperlipidemia   . Hypertension   . Meniere's disease   . Neuromuscular disorder (HCC)    fibromyalgia  . Thyroid disease    hypothyroid  . 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 Celiac disease Mother   . Diabetes Mother   . Heart disease Mother   . Prostate cancer Father   . Breast cancer Maternal Aunt   . Diabetes Sister   . Meniere's disease Sister   . Celiac disease Sister   . Diabetes Sister   . Colon cancer Neg Hx   . Esophageal cancer Neg Hx   . Stomach cancer Neg Hx   . Rectal cancer Neg Hx   . Liver cancer Neg Hx   . Pancreatic cancer Neg Hx    Past Surgical History:  Procedure Laterality Date  . CARDIAC CATHETERIZATION N/A 02/25/2015   Procedure: Left Heart Cath and Coronary Angiography;  Surgeon: Peter M Martinique, MD;  Location: [facility];  Service: Cardiovascular;  Laterality: N/A;  . CERVICAL DISC SURGERY    . CHOLECYSTECTOMY    . COLONOSCOPY    . DILATION AND CURETTAGE OF UTERUS    . HAND SURGERY     right hand; calcium deposit on wrist and under thumb and ganglion cyst and carpal tunnel  . lumbar radiofrequency nerve ablation     twice yearly  . MOUTH SURGERY  2016   couple times due to gum disease  . ROTATOR CUFF REPAIR  06/07/2015   left shoulder  . TOTAL ABDOMINAL HYSTERECTOMY    .  UPPER GASTROINTESTINAL ENDOSCOPY     Social History   Social History Narrative  . Not on file   Immunization History  Administered Date(s) Administered  . Pneumococcal Conjugate-13 07/17/2013  . Tdap 06/26/2013  . Zoster 07/17/2013     Objective: Vital Signs: There were no vitals taken for this visit.   Physical Exam   Musculoskeletal Exam: ***  CDAI Exam: CDAI Score: Not documented Patient Global Assessment: Not documented; Provider Global Assessment: Not documented Swollen: Not documented; Tender: Not documented Joint Exam   Not documented   There is currently  no information documented on the homunculus. Go to the Rheumatology activity and complete the homunculus joint exam.  Investigation: No additional findings.  Imaging: No results found.  Recent Labs: Lab Results  Component Value Date   WBC 7.0 06/30/2017   HGB 13.2 06/30/2017   PLT 216 06/30/2017   NA 136 06/30/2017   K 4.0 06/30/2017   CL 101 06/30/2017   CO2 32 06/30/2017   GLUCOSE 98 06/30/2017   BUN 22 06/30/2017   CREATININE 0.82 06/30/2017   BILITOT 0.5 06/30/2017   ALKPHOS 25 (L) 06/30/2017   AST 19 06/30/2017   ALT 16 06/30/2017   PROT 7.4 06/30/2017   ALBUMIN 4.5 06/30/2017   CALCIUM 10.1 06/30/2017   GFRAA 85 12/12/2016    Speciality Comments: No specialty comments available.  Procedures:  No procedures performed Allergies: Crestor [rosuvastatin]; Nortriptyline; Nortriptyline hcl; and Vicodin [hydrocodone-acetaminophen]   Assessment / Plan:     Visit Diagnoses: No diagnosis found.   Orders: No orders of the defined types were placed in this encounter.  No orders of the defined types were placed in this encounter.   Face-to-face time spent with patient was *** minutes. Greater than 50% of time was spent in counseling and coordination of care.  Follow-Up Instructions: No follow-ups on file.   Earnestine Mealing, CMA  Note - This record has been created using Editor, commissioning.  Chart creation errors have been sought, but may not always  have been located. Such creation errors do not reflect on  the standard of medical care.

## 2018-06-15 NOTE — Progress Notes (Deleted)
Office Visit Note  Patient: Kimberly Stein             Date of Birth: 02-19-1956           MRN: [id number]             PCP: Eulas Post, MD Referring: Eulas Post, MD Visit Date: 06/29/2018 Occupation: @GUAROCC @  Subjective:  No chief complaint on file.   History of Present Illness: Kimberly Stein is a 63 y.o. female ***   Activities of Daily Living:  Patient reports morning stiffness for *** {minute/hour:19697}.   Patient {ACTIONS;DENIES/REPORTS:21021675::"Denies"} nocturnal pain.  Difficulty dressing/grooming: {ACTIONS;DENIES/REPORTS:21021675::"Denies"} Difficulty climbing stairs: {ACTIONS;DENIES/REPORTS:21021675::"Denies"} Difficulty getting out of chair: {ACTIONS;DENIES/REPORTS:21021675::"Denies"} Difficulty using hands for taps, buttons, cutlery, and/or writing: {ACTIONS;DENIES/REPORTS:21021675::"Denies"}  No Rheumatology ROS completed.   PMFS History:  Patient Active Problem List   Diagnosis Date Noted  . Other fatigue 06/13/2016  . Primary insomnia 06/13/2016  . Primary osteoarthritis of both hands 06/13/2016  . Trochanteric bursitis of both hips 06/13/2016  . DDD cervical spine status post fusion 06/13/2016  . Osteoarthritis of lumbar spine 06/13/2016  . Tendinopathy of right rotator cuff 06/13/2016  . Chest pain 02/25/2015  . Abnormal nuclear stress test 02/25/2015  . Hyperlipidemia 07/17/2013  . Osteopenia 01/28/2012  . 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 CELIAC SPRUE 09/29/2009  . Hypothyroidism 09/07/2009  . Meniere's disease 09/07/2009  . Essential hypertension 09/07/2009  . RUQ PAIN 09/07/2009  . Fibromyalgia 06/12/2009  . CERVICALGIA 06/03/2009  . ANEMIA-IRON DEFICIENCY 10/21/2008  . GERD 10/21/2008  . DIZZINESS 10/21/2008  . COLONIC POLYPS, HX OF 10/21/2008    Past Medical History:  Diagnosis Date  . Allergy    sinus infections  . Anemia   . Arthritis   . Celiac disease   . GERD (gastroesophageal reflux disease)   . Heart  murmur   . Hiatal hernia   . Hyperlipidemia   . Hypertension   . Meniere's disease   . Neuromuscular disorder (HCC)    fibromyalgia  . Thyroid disease    hypothyroid  . 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 Celiac disease Mother   . Diabetes Mother   . Heart disease Mother   . Prostate cancer Father   . Breast cancer Maternal Aunt   . Diabetes Sister   . Meniere's disease Sister   . Celiac disease Sister   . Diabetes Sister   . Colon cancer Neg Hx   . Esophageal cancer Neg Hx   . Stomach cancer Neg Hx   . Rectal cancer Neg Hx   . Liver cancer Neg Hx   . Pancreatic cancer Neg Hx    Past Surgical History:  Procedure Laterality Date  . CARDIAC CATHETERIZATION N/A 02/25/2015   Procedure: Left Heart Cath and Coronary Angiography;  Surgeon: Peter M Martinique, MD;  Location: [facility];  Service: Cardiovascular;  Laterality: N/A;  . CERVICAL DISC SURGERY    . CHOLECYSTECTOMY    . COLONOSCOPY    . DILATION AND CURETTAGE OF UTERUS    . HAND SURGERY     right hand; calcium deposit on wrist and under thumb and ganglion cyst and carpal tunnel  . lumbar radiofrequency nerve ablation     twice yearly  . MOUTH SURGERY  2016   couple times due to gum disease  . ROTATOR CUFF REPAIR  06/07/2015   left shoulder  . TOTAL ABDOMINAL HYSTERECTOMY    .  UPPER GASTROINTESTINAL ENDOSCOPY     Social History   Social History Narrative  . Not on file   Immunization History  Administered Date(s) Administered  . Pneumococcal Conjugate-13 07/17/2013  . Tdap 06/26/2013  . Zoster 07/17/2013     Objective: Vital Signs: There were no vitals taken for this visit.   Physical Exam   Musculoskeletal Exam: ***  CDAI Exam: CDAI Score: Not documented Patient Global Assessment: Not documented; Provider Global Assessment: Not documented Swollen: Not documented; Tender: Not documented Joint Exam   Not documented   There is currently  no information documented on the homunculus. Go to the Rheumatology activity and complete the homunculus joint exam.  Investigation: No additional findings.  Imaging: No results found.  Recent Labs: Lab Results  Component Value Date   WBC 7.0 06/30/2017   HGB 13.2 06/30/2017   PLT 216 06/30/2017   NA 136 06/30/2017   K 4.0 06/30/2017   CL 101 06/30/2017   CO2 32 06/30/2017   GLUCOSE 98 06/30/2017   BUN 22 06/30/2017   CREATININE 0.82 06/30/2017   BILITOT 0.5 06/30/2017   ALKPHOS 25 (L) 06/30/2017   AST 19 06/30/2017   ALT 16 06/30/2017   PROT 7.4 06/30/2017   ALBUMIN 4.5 06/30/2017   CALCIUM 10.1 06/30/2017   GFRAA 85 12/12/2016    Speciality Comments: No specialty comments available.  Procedures:  No procedures performed Allergies: Crestor [rosuvastatin]; Nortriptyline; Nortriptyline hcl; and Vicodin [hydrocodone-acetaminophen]   Assessment / Plan:     Visit Diagnoses: No diagnosis found.   Orders: No orders of the defined types were placed in this encounter.  No orders of the defined types were placed in this encounter.   Face-to-face time spent with patient was *** minutes. Greater than 50% of time was spent in counseling and coordination of care.  Follow-Up Instructions: No follow-ups on file.   Earnestine Mealing, CMA  Note - This record has been created using Editor, commissioning.  Chart creation errors have been sought, but may not always  have been located. Such creation errors do not reflect on  the standard of medical care.

## 2018-06-18 ENCOUNTER — Ambulatory Visit: Admitting: Rheumatology

## 2018-06-20 ENCOUNTER — Other Ambulatory Visit: Payer: Self-pay | Admitting: Family Medicine

## 2018-06-29 ENCOUNTER — Ambulatory Visit: Admitting: Rheumatology

## 2018-07-02 NOTE — Progress Notes (Signed)
Office Visit Note  Patient: Kimberly Stein             Date of Birth: Nov 08, 1955           MRN: [id number]             PCP: Eulas Post, MD Referring: Eulas Post, MD Visit Date: 07/06/2018 Occupation: @GUAROCC @  Subjective:  Muscle spasms   History of Present Illness: Kimberly Stein is a 63 y.o. female with history of fibromyalgia and DDD.  She takes Flexeril 10 mg po at bedtime for muscle spasms.  She reports that she has been having increased upper back pain for the past 4 to 5 months.  She states that she has been experiencing muscle tenderness throughout her rib cage.  She also been having increased muscle cramps and muscle spasms in her back and ribs.  She reports that taking Flexeril at bedtime does help her pain.  She reports that her right shoulder is doing good without any discomfort at this time.  She continues to have bilateral trochanter bursitis.  She states that she had a cortisone injection performed by Dr. Durward Fortes which did not provide any relief.  She states that she continues to feel nerve ablations.  Her most recent evaluation was on 06/25/2018 which provided significant relief.  She reports that she has intermittent pain in bilateral hands but denies any joint swelling.    Activities of Daily Living:  Patient reports morning stiffness for 1.5-2 hours.   Patient Reports nocturnal pain.  Difficulty dressing/grooming: Denies Difficulty climbing stairs: Reports Difficulty getting out of chair: Denies Difficulty using hands for taps, buttons, cutlery, and/or writing: Denies  Review of Systems  Constitutional: Positive for fatigue.  HENT: Positive for mouth dryness. Negative for mouth sores and nose dryness.   Eyes: Negative for pain, itching, visual disturbance and dryness.  Respiratory: Negative for cough, hemoptysis, shortness of breath, wheezing and difficulty breathing.   Cardiovascular: Negative for chest pain, palpitations, hypertension and  swelling in legs/feet.  Gastrointestinal: Negative for abdominal pain, blood in stool, constipation and diarrhea.  Endocrine: Negative for increased urination.  Genitourinary: Negative for painful urination and pelvic pain.  Musculoskeletal: Positive for arthralgias, joint pain and morning stiffness. Negative for joint swelling, myalgias, muscle weakness, muscle tenderness and myalgias.  Skin: Positive for nodules/bumps. Negative for color change, pallor, rash, hair loss, redness, skin tightness, ulcers and sensitivity to sunlight.  Allergic/Immunologic: Negative for susceptible to infections.  Neurological: Negative for dizziness, light-headedness, numbness, headaches and memory loss.  Hematological: Negative for bruising/bleeding tendency and swollen glands.  Psychiatric/Behavioral: Negative for depressed mood, confusion and sleep disturbance. The patient is not nervous/anxious.     PMFS History:  Patient Active Problem List   Diagnosis Date Noted  . Other fatigue 06/13/2016  . Primary insomnia 06/13/2016  . Primary osteoarthritis of both hands 06/13/2016  . Trochanteric bursitis of both hips 06/13/2016  . DDD cervical spine status post fusion 06/13/2016  . Osteoarthritis of lumbar spine 06/13/2016  . Tendinopathy of right rotator cuff 06/13/2016  . Chest pain 02/25/2015  . Abnormal nuclear stress test 02/25/2015  . Hyperlipidemia 07/17/2013  . Osteopenia 01/28/2012  . 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 CELIAC SPRUE 09/29/2009  . Hypothyroidism 09/07/2009  . Meniere's disease 09/07/2009  . Essential hypertension 09/07/2009  . RUQ PAIN 09/07/2009  . Fibromyalgia 06/12/2009  . CERVICALGIA 06/03/2009  . ANEMIA-IRON DEFICIENCY 10/21/2008  . GERD 10/21/2008  . DIZZINESS 10/21/2008  . COLONIC POLYPS, HX  OF 10/21/2008    Past Medical History:  Diagnosis Date  . Allergy    sinus infections  . Anemia   . Arthritis   . Celiac disease   . GERD (gastroesophageal reflux disease)   .  Heart murmur   . Hiatal hernia   . Hyperlipidemia   . Hypertension   . Meniere's disease   . Neuromuscular disorder (HCC)    fibromyalgia  . Thyroid disease    hypothyroid  . 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 Celiac disease Mother   . Diabetes Mother   . Heart disease Mother   . Prostate cancer Father   . Breast cancer Maternal Aunt   . Diabetes Sister   . Meniere's disease Sister   . Celiac disease Sister   . Diabetes Sister   . Colon cancer Neg Hx   . Esophageal cancer Neg Hx   . Stomach cancer Neg Hx   . Rectal cancer Neg Hx   . Liver cancer Neg Hx   . Pancreatic cancer Neg Hx    Past Surgical History:  Procedure Laterality Date  . CARDIAC CATHETERIZATION N/A 02/25/2015   Procedure: Left Heart Cath and Coronary Angiography;  Surgeon: Peter M Martinique, MD;  Location: [facility];  Service: Cardiovascular;  Laterality: N/A;  . CERVICAL DISC SURGERY    . CHOLECYSTECTOMY    . COLONOSCOPY    . DILATION AND CURETTAGE OF UTERUS    . HAND SURGERY     right hand; calcium deposit on wrist and under thumb and ganglion cyst and carpal tunnel  . lumbar radiofrequency nerve ablation     twice yearly  . MOUTH SURGERY  2016   couple times due to gum disease  . ROTATOR CUFF REPAIR  06/07/2015   left shoulder  . TOTAL ABDOMINAL HYSTERECTOMY    . UPPER GASTROINTESTINAL ENDOSCOPY     Social History   Social History Narrative  . Not on file   Immunization History  Administered Date(s) Administered  . Pneumococcal Conjugate-13 07/17/2013  . Tdap 06/26/2013  . Zoster 07/17/2013     Objective: Vital Signs: BP 122/74 (BP Location: Left Arm, Patient Position: Sitting, Cuff Size: Normal)   Pulse 70   Resp 12   Ht 5' 3"  (1.6 m)   Wt 150 lb (68 kg)   BMI 26.57 kg/m    Physical Exam Vitals signs and nursing note reviewed.  Constitutional:      Appearance: She is well-developed.  HENT:     Head: Normocephalic  and atraumatic.  Eyes:     Conjunctiva/sclera: Conjunctivae normal.  Neck:     Musculoskeletal: Normal range of motion.  Cardiovascular:     Rate and Rhythm: Normal rate and regular rhythm.     Heart sounds: Normal heart sounds.  Pulmonary:     Effort: Pulmonary effort is normal.     Breath sounds: Normal breath sounds.  Abdominal:     General: Bowel sounds are normal.     Palpations: Abdomen is soft.  Lymphadenopathy:     Cervical: No cervical adenopathy.  Skin:    General: Skin is warm and dry.     Capillary Refill: Capillary refill takes less than 2 seconds.  Neurological:     Mental Status: She is alert and oriented to person, place, and time.  Psychiatric:        Behavior: Behavior normal.      Musculoskeletal  Exam: C-spine limited ROM.  Thoracic and lumbar spine good ROM.  No midline spinal tenderness.  No SI joint tenderness. Tenderness in the right paraspinal muscles in the mid-thoracic region. Shoulder joints, elbow joints, wrist joints, MCPs, PIPs, and DIPs good ROM with no synovitis.  Complete fist formation.  PIP and DIP synovial thickening.  Hip joints, knee joints, ankle joints, MTPs, PIPs, and DIPs good ROM with no synovitis.  No warmth or effusion of knee joints.  No tenderness or swelling of ankle joints. Tenderness over bilateral trochanteric bursa.    CDAI Exam: CDAI Score: Not documented Patient Global Assessment: Not documented; Provider Global Assessment: Not documented Swollen: Not documented; Tender: Not documented Joint Exam   Not documented   There is currently no information documented on the homunculus. Go to the Rheumatology activity and complete the homunculus joint exam.  Investigation: No additional findings.  Imaging: No results found.  Recent Labs: Lab Results  Component Value Date   WBC 7.0 06/30/2017   HGB 13.2 06/30/2017   PLT 216 06/30/2017   NA 136 06/30/2017   K 4.0 06/30/2017   CL 101 06/30/2017   CO2 32 06/30/2017    GLUCOSE 98 06/30/2017   BUN 22 06/30/2017   CREATININE 0.82 06/30/2017   BILITOT 0.5 06/30/2017   ALKPHOS 25 (L) 06/30/2017   AST 19 06/30/2017   ALT 16 06/30/2017   PROT 7.4 06/30/2017   ALBUMIN 4.5 06/30/2017   CALCIUM 10.1 06/30/2017   GFRAA 85 12/12/2016    Speciality Comments: No specialty comments available.  Procedures:  No procedures performed Allergies: Crestor [rosuvastatin]; Nortriptyline; Nortriptyline hcl; and Vicodin [hydrocodone-acetaminophen]   Assessment / Plan:     Visit Diagnoses: Fibromyalgia - She has generalized hyperalgesia and positive tender points on exam. She has generalized muscle aches and muscle tenderness. She has trapezius muscle tenderness and right paraspinal muscles in mid-thoracic region. She has no midline spinal tenderness. She has been having more frequently muscle spasms and muscle cramps in the intercostal muscles.  She has been taking Flexeril 10 mg po at bedtime which has been helping.  We discussed trying a short course of Robaxin 500 mg 1 tablet PRN for muscle spasms.  She continues to stay active and exercise on a regular basis.  The importance of regular exercise and good sleep hygiene was discussed. She will follow up in 6 months.    Trochanteric bursitis of both hips: She has tenderness over bilateral trochanteric bursa.  She was encouraged to continue to perform stretching exercises reguarly.   Primary osteoarthritis of both hands: She has PIP and DIP synovial thickening consistent with osteoarthritis.  She has complete fist formation.  Joint protection and muscle strengthening was discussed.   Tendinopathy of right rotator cuff: Doing well.  She has good ROM with no discomfort.  No tenderness or effusion noted.   DDD (degenerative disc disease), cervical - s/p fusion: She has limited ROM of lateral rotation.  She has no symptoms of radiculopathy at this time. She has no neck pain at this time.   DDD (degenerative disc disease), lumbar:  Chronic pain.  She sees Dr. Maryjean Ka.  She had a nerve ablation on 06/25/18 and has noticed significant pain relief.   Primary insomnia: She has been having difficulty sleeping at night due to pain she experiences.    Other fatigue: Her level of fatigue has been stable.   Osteopenia of multiple sites: She takes calcium and vitamin D supplements.   Other medical conditions are  listed as follows:   History of hypothyroidism  History of hyperlipidemia  History of gastroesophageal reflux (GERD)  History of anemia  History of hypertension  History of Meniere's disease   Orders: No orders of the defined types were placed in this encounter.  Meds ordered this encounter  Medications  . methocarbamol (ROBAXIN) 500 MG tablet    Sig: Take 1 tablet (500 mg total) by mouth daily as needed for muscle spasms.    Dispense:  30 tablet    Refill:  0    Face-to-face time spent with patient was 30 minutes. Greater than 50% of time was spent in counseling and coordination of care.  Follow-Up Instructions: Return in about 6 months (around 01/04/2019) for Fibromyalgia, DDD.   Ofilia Neas, PA-C   I examined and evaluated the patient with Hazel Sams PA.  Patient continues to have some generalized pain from fibromyalgia.  She has lot of rib cage pain from fibromyalgia.  She had no synovitis on my examination today.  She had no point tenderness over the thoracic region.  She has been experiencing muscle spasms for which option for Robaxin was given.  Side effects were discussed.  The plan of care was discussed as noted above.  Bo Merino, MD  Note - This record has been created using Editor, commissioning.  Chart creation errors have been sought, but may not always  have been located. Such creation errors do not reflect on  the standard of medical care.

## 2018-07-04 ENCOUNTER — Telehealth: Payer: Self-pay | Admitting: Acute Care

## 2018-07-04 DIAGNOSIS — Z87891 Personal history of nicotine dependence: Secondary | ICD-10-CM

## 2018-07-04 DIAGNOSIS — F17210 Nicotine dependence, cigarettes, uncomplicated: Secondary | ICD-10-CM

## 2018-07-04 DIAGNOSIS — Z122 Encounter for screening for malignant neoplasm of respiratory organs: Secondary | ICD-10-CM

## 2018-07-05 NOTE — Telephone Encounter (Signed)
LMTC x 1  

## 2018-07-05 NOTE — Telephone Encounter (Signed)
Spoke with pt and scheduled SDMV 07/25/18 9:30 CT ordered Nothing further needed

## 2018-07-06 ENCOUNTER — Encounter: Payer: Self-pay | Admitting: Rheumatology

## 2018-07-06 ENCOUNTER — Ambulatory Visit (INDEPENDENT_AMBULATORY_CARE_PROVIDER_SITE_OTHER): Admitting: Rheumatology

## 2018-07-06 VITALS — BP 122/74 | HR 70 | Resp 12 | Ht 63.0 in | Wt 150.0 lb

## 2018-07-06 DIAGNOSIS — M7061 Trochanteric bursitis, right hip: Secondary | ICD-10-CM

## 2018-07-06 DIAGNOSIS — Z8719 Personal history of other diseases of the digestive system: Secondary | ICD-10-CM

## 2018-07-06 DIAGNOSIS — M51369 Other intervertebral disc degeneration, lumbar region without mention of lumbar back pain or lower extremity pain: Secondary | ICD-10-CM

## 2018-07-06 DIAGNOSIS — Z8679 Personal history of other diseases of the circulatory system: Secondary | ICD-10-CM

## 2018-07-06 DIAGNOSIS — M797 Fibromyalgia: Secondary | ICD-10-CM

## 2018-07-06 DIAGNOSIS — Z862 Personal history of diseases of the blood and blood-forming organs and certain disorders involving the immune mechanism: Secondary | ICD-10-CM

## 2018-07-06 DIAGNOSIS — M7062 Trochanteric bursitis, left hip: Secondary | ICD-10-CM

## 2018-07-06 DIAGNOSIS — F5101 Primary insomnia: Secondary | ICD-10-CM

## 2018-07-06 DIAGNOSIS — M19041 Primary osteoarthritis, right hand: Secondary | ICD-10-CM | POA: Diagnosis not present

## 2018-07-06 DIAGNOSIS — Z8669 Personal history of other diseases of the nervous system and sense organs: Secondary | ICD-10-CM

## 2018-07-06 DIAGNOSIS — M67911 Unspecified disorder of synovium and tendon, right shoulder: Secondary | ICD-10-CM

## 2018-07-06 DIAGNOSIS — Z8639 Personal history of other endocrine, nutritional and metabolic disease: Secondary | ICD-10-CM

## 2018-07-06 DIAGNOSIS — M8589 Other specified disorders of bone density and structure, multiple sites: Secondary | ICD-10-CM

## 2018-07-06 DIAGNOSIS — M19042 Primary osteoarthritis, left hand: Secondary | ICD-10-CM

## 2018-07-06 DIAGNOSIS — R5383 Other fatigue: Secondary | ICD-10-CM

## 2018-07-06 DIAGNOSIS — M5136 Other intervertebral disc degeneration, lumbar region: Secondary | ICD-10-CM

## 2018-07-06 DIAGNOSIS — M5030 Other cervical disc degeneration, unspecified cervical region: Secondary | ICD-10-CM

## 2018-07-06 MED ORDER — METHOCARBAMOL 500 MG PO TABS: 500.0000 mg | ORAL_TABLET | Freq: Every day | ORAL | 0 refills | Status: DC | PRN

## 2018-07-17 ENCOUNTER — Other Ambulatory Visit: Payer: Self-pay | Admitting: Family Medicine

## 2018-07-19 LAB — HM DEXA SCAN
HM Dexa Scan: NORMAL
HM Dexa Scan: NORMAL

## 2018-07-20 ENCOUNTER — Other Ambulatory Visit: Payer: Self-pay | Admitting: Family Medicine

## 2018-07-23 ENCOUNTER — Other Ambulatory Visit: Payer: Self-pay | Admitting: Family Medicine

## 2018-07-23 ENCOUNTER — Other Ambulatory Visit: Payer: Self-pay | Admitting: Rheumatology

## 2018-07-23 NOTE — Telephone Encounter (Signed)
Last Visit: 07/06/18 Next Visit: 01/04/19  Okay to refill per Dr. Estanislado Pandy

## 2018-07-24 ENCOUNTER — Other Ambulatory Visit: Payer: Self-pay

## 2018-07-24 ENCOUNTER — Telehealth: Payer: Self-pay | Admitting: *Deleted

## 2018-07-24 ENCOUNTER — Other Ambulatory Visit: Payer: Self-pay | Admitting: Family Medicine

## 2018-07-24 MED ORDER — LOSARTAN POTASSIUM 50 MG PO TABS: ORAL_TABLET | ORAL | 0 refills | Status: DC

## 2018-07-24 NOTE — Telephone Encounter (Signed)
Copied from [place] [phone]. Topic: General - Other >> Jul 24, 2018  9:57 AM Carolyn Stare wrote: Pt scheduled her CPE for 08/08/2018 and is asking if enough meds can be called in till her appt   losartan (COZAAR) 50 MG tablet  [place]

## 2018-07-24 NOTE — Telephone Encounter (Signed)
A 30 day refill has been sent in for the patient.

## 2018-07-25 ENCOUNTER — Encounter: Payer: Self-pay | Admitting: Acute Care

## 2018-07-25 ENCOUNTER — Other Ambulatory Visit: Payer: Self-pay | Admitting: Family Medicine

## 2018-07-25 ENCOUNTER — Ambulatory Visit: Admission: RE | Admit: 2018-07-25 | Source: Ambulatory Visit

## 2018-07-25 ENCOUNTER — Ambulatory Visit (INDEPENDENT_AMBULATORY_CARE_PROVIDER_SITE_OTHER): Admitting: Acute Care

## 2018-07-25 VITALS — BP 120/72 | HR 94 | Ht 63.0 in | Wt 151.0 lb

## 2018-07-25 DIAGNOSIS — F17210 Nicotine dependence, cigarettes, uncomplicated: Secondary | ICD-10-CM

## 2018-07-25 NOTE — Progress Notes (Signed)
Shared Decision Making Visit Lung Cancer Screening Program [phone])   Eligibility:  Age 63 y.o.  Pack Years Smoking History Calculation 44 pack year smoking history (# packs/per year x # years smoked)  Recent History of coughing up blood  no  Unexplained weight loss? no ( >Than 15 pounds within the last 6 months )  Prior History Lung / other cancer no (Diagnosis within the last 5 years already requiring surveillance chest CT Scans).  Smoking Status Current Smoker  Former Smokers: Years since quit: NA  Quit Date: NA  Visit Components:  Discussion included one or more decision making aids. yes  Discussion included risk/benefits of screening. yes  Discussion included potential follow up diagnostic testing for abnormal scans. yes  Discussion included meaning and risk of over diagnosis. yes  Discussion included meaning and risk of False Positives. yes  Discussion included meaning of total radiation exposure. yes  Counseling Included:  Importance of adherence to annual lung cancer LDCT screening. yes  Impact of comorbidities on ability to participate in the program. yes  Ability and willingness to under diagnostic treatment. yes  Smoking Cessation Counseling:  Current Smokers:   Discussed importance of smoking cessation. yes  Information about tobacco cessation classes and interventions provided to patient. yes  Patient provided with "ticket" for LDCT Scan. yes  Symptomatic Patient. no  Counseling  Diagnosis Code: Tobacco Use Z72.0  Asymptomatic Patient yes  Counseling (Intermediate counseling: > three minutes counseling) O1308  Former Smokers:   Discussed the importance of maintaining cigarette abstinence. yes  Diagnosis Code: Personal History of Nicotine Dependence. M57.846  Information about tobacco cessation classes and interventions provided to patient. Yes  Patient provided with "ticket" for LDCT Scan. yes  Written Order for Lung Cancer  Screening with LDCT placed in Epic. Yes (CT Chest Lung Cancer Screening Low Dose W/O CM) [id number] Z12.2-Screening of respiratory organs Z87.891-Personal history of nicotine dependence  I have spent 25 minutes of face to face time with Ms. Sedgwick discussing the risks and benefits of lung cancer screening. We viewed a power point together that explained in detail the above noted topics. We paused at intervals to allow for questions to be asked and answered to ensure understanding.We discussed that the single most powerful action that she can take to decrease her risk of developing lung cancer is to quit smoking. We discussed whether or not she is ready to commit to setting a quit date. We discussed options for tools to aid in quitting smoking including nicotine replacement therapy, non-nicotine medications, support groups, Quit Smart classes, and behavior modification. We discussed that often times setting smaller, more achievable goals, such as eliminating 1 cigarette a day for a week and then 2 cigarettes a day for a week can be helpful in slowly decreasing the number of cigarettes smoked. This allows for a sense of accomplishment as well as providing a clinical benefit. I gave her the " Be Stronger Than Your Excuses" card with contact information for community resources, classes, free nicotine replacement therapy, and access to mobile apps, text messaging, and on-line smoking cessation help. I have also given her my card and contact information in the event she needs to contact me. We discussed the time and location of the scan, and that either Doroteo Glassman RN or I will call with the results within 24-48 hours of receiving them. I have offered her  a copy of the power point we viewed  as a resource in the event they need reinforcement of  the concepts we discussed today in the office. The patient verbalized understanding of all of  the above and had no further questions upon leaving the office. They have my  contact information in the event they have any further questions.  I spent 4 minutes counseling on smoking cessation and the health risks of continued tobacco abuse.  I explained to the patient that there has been a high incidence of coronary artery disease noted on these exams. I explained that this is a non-gated exam therefore degree or severity cannot be determined. This patient is  not on statin therapy. I have asked the patient to follow-up with their PCP regarding any incidental finding of coronary artery disease and management with diet or medication as their PCP  feels is clinically indicated. The patient verbalized understanding of the above and had no further questions upon completion of the visit.     Magdalen Spatz, NP 07/25/2018 11:08 AM

## 2018-08-04 ENCOUNTER — Other Ambulatory Visit: Payer: Self-pay | Admitting: Family Medicine

## 2018-08-08 ENCOUNTER — Encounter: Payer: Self-pay | Admitting: Family Medicine

## 2018-08-08 ENCOUNTER — Ambulatory Visit (INDEPENDENT_AMBULATORY_CARE_PROVIDER_SITE_OTHER): Admitting: Family Medicine

## 2018-08-08 ENCOUNTER — Other Ambulatory Visit: Payer: Self-pay

## 2018-08-08 VITALS — BP 112/68 | HR 85 | Temp 98.3°F | Ht 61.5 in | Wt 151.8 lb

## 2018-08-08 DIAGNOSIS — Z0000 Encounter for general adult medical examination without abnormal findings: Secondary | ICD-10-CM | POA: Diagnosis not present

## 2018-08-08 DIAGNOSIS — E785 Hyperlipidemia, unspecified: Secondary | ICD-10-CM

## 2018-08-08 LAB — LIPID PANEL
Cholesterol: 237 mg/dL — ABNORMAL HIGH (ref 0–200)
HDL: 56 mg/dL (ref >39.00)
LDL Cholesterol: 148 mg/dL — ABNORMAL HIGH (ref 0–99)
NonHDL: 181.23
Total CHOL/HDL Ratio: 4
Triglycerides: 166 mg/dL — ABNORMAL HIGH (ref 0.0–149.0)
VLDL: 33.2 mg/dL (ref 0.0–40.0)

## 2018-08-08 LAB — HEPATIC FUNCTION PANEL
ALT: 19 U/L (ref 0–35)
AST: 18 U/L (ref 0–37)
Albumin: 4.6 g/dL (ref 3.5–5.2)
Alkaline Phosphatase: 26 U/L — ABNORMAL LOW (ref 39–117)
Bilirubin, Direct: 0.1 mg/dL (ref 0.0–0.3)
Total Bilirubin: 0.4 mg/dL (ref 0.2–1.2)
Total Protein: 7.3 g/dL (ref 6.0–8.3)

## 2018-08-08 LAB — BASIC METABOLIC PANEL
BUN: 18 mg/dL (ref 6–23)
CO2: 30 mEq/L (ref 19–32)
Calcium: 10.4 mg/dL (ref 8.4–10.5)
Chloride: 100 mEq/L (ref 96–112)
Creatinine, Ser: 0.74 mg/dL (ref 0.40–1.20)
GFR: 79.36 mL/min (ref >60.00)
Glucose, Bld: 98 mg/dL (ref 70–99)
Potassium: 4 mEq/L (ref 3.5–5.1)
Sodium: 139 mEq/L (ref 135–145)

## 2018-08-08 LAB — CBC WITH DIFFERENTIAL/PLATELET
Basophils Absolute: 0.1 10*3/uL (ref 0.0–0.1)
Basophils Relative: 0.7 % (ref 0.0–3.0)
Eosinophils Absolute: 0.1 10*3/uL (ref 0.0–0.7)
Eosinophils Relative: 1.6 % (ref 0.0–5.0)
HCT: 39.4 % (ref 36.0–46.0)
Hemoglobin: 13.6 g/dL (ref 12.0–15.0)
Lymphocytes Relative: 49.3 % — ABNORMAL HIGH (ref 12.0–46.0)
Lymphs Abs: 3.4 10*3/uL (ref 0.7–4.0)
MCHC: 34.6 g/dL (ref 30.0–36.0)
MCV: 93.5 fl (ref 78.0–100.0)
Monocytes Absolute: 0.4 10*3/uL (ref 0.1–1.0)
Monocytes Relative: 5.1 % (ref 3.0–12.0)
Neutro Abs: 3 10*3/uL (ref 1.4–7.7)
Neutrophils Relative %: 43.3 % (ref 43.0–77.0)
Platelets: 190 10*3/uL (ref 150.0–400.0)
RBC: 4.22 Mil/uL (ref 3.87–5.11)
RDW: 13.5 % (ref 11.5–15.5)
WBC: 6.9 10*3/uL (ref 4.0–10.5)

## 2018-08-08 LAB — TSH: TSH: 0.44 u[IU]/mL (ref 0.35–4.50)

## 2018-08-08 MED ORDER — CHLORTHALIDONE 25 MG PO TABS: 25.0000 mg | ORAL_TABLET | Freq: Every day | ORAL | 3 refills | Status: DC

## 2018-08-08 MED ORDER — POTASSIUM CHLORIDE CRYS ER 10 MEQ PO TBCR: 10.0000 meq | EXTENDED_RELEASE_TABLET | Freq: Every day | ORAL | 3 refills | Status: DC

## 2018-08-08 MED ORDER — LOSARTAN POTASSIUM 50 MG PO TABS: ORAL_TABLET | ORAL | 3 refills | Status: DC

## 2018-08-08 MED ORDER — LEVOTHYROXINE SODIUM 150 MCG PO TABS: 150.0000 ug | ORAL_TABLET | Freq: Every day | ORAL | 3 refills | Status: DC

## 2018-08-08 MED ORDER — ALBUTEROL SULFATE HFA 108 (90 BASE) MCG/ACT IN AERS: 2.0000 | INHALATION_SPRAY | Freq: Two times a day (BID) | RESPIRATORY_TRACT | 3 refills | Status: DC

## 2018-08-08 NOTE — Progress Notes (Addendum)
Subjective:     Patient ID: Kimberly Stein, female   DOB: [birth year], 63 y.o.   MRN: [id number]  HPI Patient is seen for complete physical.  Chronic problems include history of hypertension, GERD, celiac disease, hypothyroidism, ongoing nicotine use, osteopenia, degenerative arthritis involving multiple joints, fibromyalgia, hyperlipidemia.  She needs refills of several medications.  Her blood pressures been stable.  She is compliant with all medications  Health maintenance discussed.  -Hepatitis C screen 2019- -Scheduled for low-dose CT lung cancer screening -Tetanus 2015 -Colonoscopy April 2019 with recommended 3-year follow-up -Mammogram May 2019 and she gets this yearly -Shingles vaccine 2015 -She has had previous hysterectomy and does not get Pap smears  Still smokes 1 pack/day.  She has plan to taper down 1 cigarette/day every couple of weeks  The 10-year ASCVD risk score Mikey Bussing DC Brooke Bonito., et al., 2013) is: 9.2%   Values used to calculate the score:     Age: 76 years     Sex: Female     Is Non-Hispanic African American: No     Diabetic: No     Tobacco smoker: Yes     Systolic Blood Pressure: 440 mmHg     Is BP treated: Yes     HDL Cholesterol: 56 mg/dL     Total Cholesterol: 237 mg/dL   Past Medical History:  Diagnosis Date  . Allergy    sinus infections  . Anemia   . Arthritis   . Celiac disease   . GERD (gastroesophageal reflux disease)   . Heart murmur   . Hiatal hernia   . Hyperlipidemia   . Hypertension   . Meniere's disease   . Neuromuscular disorder (HCC)    fibromyalgia  . Thyroid disease    hypothyroid  . 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 CARDIAC CATHETERIZATION N/A 02/25/2015   Procedure: Left Heart Cath and Coronary Angiography;  Surgeon: Peter M Martinique, MD;  Location: [facility];  Service: Cardiovascular;  Laterality: N/A;  . CERVICAL DISC SURGERY    . CHOLECYSTECTOMY    .  COLONOSCOPY    . DILATION AND CURETTAGE OF UTERUS    . HAND SURGERY     right hand; calcium deposit on wrist and under thumb and ganglion cyst and carpal tunnel  . lumbar radiofrequency nerve ablation     twice yearly  . MOUTH SURGERY  2016   couple times due to gum disease  . ROTATOR CUFF REPAIR  06/07/2015   left shoulder  . TOTAL ABDOMINAL HYSTERECTOMY    . UPPER GASTROINTESTINAL ENDOSCOPY      reports that she has been smoking cigarettes. She has a 46.00 pack-year smoking history. She has never used smokeless tobacco. She reports that she does not drink alcohol or use drugs. family history includes Breast cancer in her maternal aunt; Celiac disease in her mother and sister; Diabetes in her mother, sister, and sister; Heart disease in her mother; Meniere's disease in her sister; Prostate cancer in her father. Allergies  Allergen Reactions  . Crestor [Rosuvastatin] Other (See Comments)    Bilateral Leg pain  . Nortriptyline Rash  . Nortriptyline Hcl Rash    Red rash  . Vicodin [Hydrocodone-Acetaminophen] Itching     Review of Systems  Constitutional: Negative for activity change, appetite change, fatigue, fever and unexpected weight change.  HENT: Negative for ear pain, hearing loss, sore throat and trouble swallowing.   Eyes: Negative for visual  disturbance.  Respiratory: Negative for cough and shortness of breath.   Cardiovascular: Negative for chest pain and palpitations.  Gastrointestinal: Negative for abdominal pain, blood in stool, constipation, diarrhea, nausea and vomiting.  Endocrine: Negative for polydipsia and polyuria.  Genitourinary: Negative for dysuria and hematuria.  Musculoskeletal: Positive for arthralgias. Negative for back pain and myalgias.  Skin: Negative for rash.  Neurological: Negative for dizziness, syncope and headaches.  Hematological: Negative for adenopathy.  Psychiatric/Behavioral: Negative for confusion and dysphoric mood.       Objective:    Physical Exam Constitutional:      Appearance: She is well-developed.  HENT:     Head: Normocephalic and atraumatic.  Eyes:     Pupils: Pupils are equal, round, and reactive to light.  Neck:     Musculoskeletal: Normal range of motion and neck supple.     Thyroid: No thyromegaly.  Cardiovascular:     Rate and Rhythm: Normal rate and regular rhythm.     Heart sounds: Normal heart sounds. No murmur.  Pulmonary:     Effort: No respiratory distress.     Breath sounds: Normal breath sounds. No wheezing or rales.  Abdominal:     General: Bowel sounds are normal. There is no distension.     Palpations: Abdomen is soft. There is no mass.     Tenderness: There is no abdominal tenderness. There is no guarding or rebound.  Musculoskeletal: Normal range of motion.  Lymphadenopathy:     Cervical: No cervical adenopathy.  Skin:    Findings: No rash.  Neurological:     Mental Status: She is alert and oriented to person, place, and time.     Cranial Nerves: No cranial nerve deficit.     Deep Tendon Reflexes: Reflexes normal.  Psychiatric:        Behavior: Behavior normal.        Thought Content: Thought content normal.        Judgment: Judgment normal.        Assessment:     Physical exam.  Patient has ongoing nicotine use but is trying to quit.  Hypertension which is well controlled.  We discussed several health maintenance issues as follows    Plan:     -She declines flu vaccines -Low-dose CT lung cancer screening already scheduled -Discussed smoking cessation and she plans to try to quit on her own -Continue with yearly mammogram -Hepatitis C screen last year negative -Tetanus in 5 years -Discussed Shingrix vaccine and at this point she wishes to wait -Refills were given for all of her regular medications for 1 year  Eulas Post MD [facility]

## 2018-08-08 NOTE — Patient Instructions (Signed)
Preventive Care 40-64 Years, Female Preventive care refers to lifestyle choices and visits with your health care provider that can promote health and wellness. What does preventive care include?   A yearly physical exam. This is also called an annual well check.  Dental exams once or twice a year.  Routine eye exams. Ask your health care provider how often you should have your eyes checked.  Personal lifestyle choices, including: ? Daily care of your teeth and gums. ? Regular physical activity. ? Eating a healthy diet. ? Avoiding tobacco and drug use. ? Limiting alcohol use. ? Practicing safe sex. ? Taking low-dose aspirin daily starting at age 50. ? Taking vitamin and mineral supplements as recommended by your health care provider. What happens during an annual well check? The services and screenings done by your health care provider during your annual well check will depend on your age, overall health, lifestyle risk factors, and family history of disease. Counseling Your health care provider may ask you questions about your:  Alcohol use.  Tobacco use.  Drug use.  Emotional well-being.  Home and relationship well-being.  Sexual activity.  Eating habits.  Work and work environment.  Method of birth control.  Menstrual cycle.  Pregnancy history. Screening You may have the following tests or measurements:  Height, weight, and BMI.  Blood pressure.  Lipid and cholesterol levels. These may be checked every 5 years, or more frequently if you are over 50 years old.  Skin check.  Lung cancer screening. You may have this screening every year starting at age 55 if you have a 30-pack-year history of smoking and currently smoke or have quit within the past 15 years.  Colorectal cancer screening. All adults should have this screening starting at age 50 and continuing until age 75. Your health care provider may recommend screening at age 45. You will have tests every  1-10 years, depending on your results and the type of screening test. People at increased risk should start screening at an earlier age. Screening tests may include: ? Guaiac-based fecal occult blood testing. ? Fecal immunochemical test (FIT). ? Stool DNA test. ? Virtual colonoscopy. ? Sigmoidoscopy. During this test, a flexible tube with a tiny camera (sigmoidoscope) is used to examine your rectum and lower colon. The sigmoidoscope is inserted through your anus into your rectum and lower colon. ? Colonoscopy. During this test, a long, thin, flexible tube with a tiny camera (colonoscope) is used to examine your entire colon and rectum.  Hepatitis C blood test.  Hepatitis B blood test.  Sexually transmitted disease (STD) testing.  Diabetes screening. This is done by checking your blood sugar (glucose) after you have not eaten for a while (fasting). You may have this done every 1-3 years.  Mammogram. This may be done every 1-2 years. Talk to your health care provider about when you should start having regular mammograms. This may depend on whether you have a family history of breast cancer.  BRCA-related cancer screening. This may be done if you have a family history of breast, ovarian, tubal, or peritoneal cancers.  Pelvic exam and Pap test. This may be done every 3 years starting at age 21. Starting at age 30, this may be done every 5 years if you have a Pap test in combination with an HPV test.  Bone density scan. This is done to screen for osteoporosis. You may have this scan if you are at high risk for osteoporosis. Discuss your test results, treatment options,   and if necessary, the need for more tests with your health care provider. Vaccines Your health care provider may recommend certain vaccines, such as:  Influenza vaccine. This is recommended every year.  Tetanus, diphtheria, and acellular pertussis (Tdap, Td) vaccine. You may need a Td booster every 10 years.  Varicella  vaccine. You may need this if you have not been vaccinated.  Zoster vaccine. You may need this after age 38.  Measles, mumps, and rubella (MMR) vaccine. You may need at least one dose of MMR if you were born in 1957 or later. You may also need a second dose.  Pneumococcal 13-valent conjugate (PCV13) vaccine. You may need this if you have certain conditions and were not previously vaccinated.  Pneumococcal polysaccharide (PPSV23) vaccine. You may need one or two doses if you smoke cigarettes or if you have certain conditions.  Meningococcal vaccine. You may need this if you have certain conditions.  Hepatitis A vaccine. You may need this if you have certain conditions or if you travel or work in places where you may be exposed to hepatitis A.  Hepatitis B vaccine. You may need this if you have certain conditions or if you travel or work in places where you may be exposed to hepatitis B.  Haemophilus influenzae type b (Hib) vaccine. You may need this if you have certain conditions. Talk to your health care provider about which screenings and vaccines you need and how often you need them. This information is not intended to replace advice given to you by your health care provider. Make sure you discuss any questions you have with your health care provider. Document Released: 06/05/2015 Document Revised: 06/29/2017 Document Reviewed: 03/10/2015 Elsevier Interactive Patient Education  2019 [employer].

## 2018-08-09 MED ORDER — ATORVASTATIN CALCIUM 20 MG PO TABS: 20.0000 mg | ORAL_TABLET | Freq: Every day | ORAL | 2 refills | Status: DC

## 2018-08-09 NOTE — Addendum Note (Signed)
Addended by: Agnes Lawrence on: 08/09/2018 08:43 AM   Modules accepted: Orders

## 2018-08-14 ENCOUNTER — Ambulatory Visit

## 2018-08-15 ENCOUNTER — Inpatient Hospital Stay: Admission: RE | Admit: 2018-08-15 | Source: Ambulatory Visit

## 2018-08-20 ENCOUNTER — Other Ambulatory Visit: Payer: Self-pay | Admitting: Family Medicine

## 2018-08-22 ENCOUNTER — Other Ambulatory Visit: Payer: Self-pay | Admitting: Family Medicine

## 2018-08-30 ENCOUNTER — Encounter: Payer: Self-pay | Admitting: Family Medicine

## 2018-09-02 ENCOUNTER — Other Ambulatory Visit: Payer: Self-pay | Admitting: Cardiovascular Disease

## 2018-09-11 ENCOUNTER — Telehealth: Payer: Self-pay | Admitting: Cardiovascular Disease

## 2018-09-11 NOTE — Telephone Encounter (Signed)
LVM for pre reg

## 2018-09-11 NOTE — Telephone Encounter (Signed)
Smartphone/ my chart/ virtual consent/ pre reg completed

## 2018-09-12 ENCOUNTER — Encounter: Payer: Self-pay | Admitting: Cardiovascular Disease

## 2018-09-12 ENCOUNTER — Telehealth (INDEPENDENT_AMBULATORY_CARE_PROVIDER_SITE_OTHER): Admitting: Cardiovascular Disease

## 2018-09-12 VITALS — Ht 63.0 in

## 2018-09-12 DIAGNOSIS — I10 Essential (primary) hypertension: Secondary | ICD-10-CM

## 2018-09-12 DIAGNOSIS — E7800 Pure hypercholesterolemia, unspecified: Secondary | ICD-10-CM

## 2018-09-12 DIAGNOSIS — K900 Celiac disease: Secondary | ICD-10-CM

## 2018-09-12 DIAGNOSIS — Z720 Tobacco use: Secondary | ICD-10-CM

## 2018-09-12 DIAGNOSIS — E781 Pure hyperglyceridemia: Secondary | ICD-10-CM

## 2018-09-12 NOTE — Patient Instructions (Signed)
Medication Instructions:  Your physician recommends that you continue on your current medications as directed. Please refer to the Current Medication list given to you today.  If you need a refill on your cardiac medications before your next appointment, please call your pharmacy.   Lab work: NONE  Testing/Procedures: NONE  Follow-Up: At [facility], you and your health needs are our priority.  As part of our continuing mission to provide you with exceptional heart care, we have created designated Provider Care Teams.  These Care Teams include your primary Cardiologist (physician) and Advanced Practice Providers (APPs -  Physician Assistants and Nurse Practitioners) who all work together to provide you with the care you need, when you need it. You will need a follow up appointment in 12 months.  Please call our office 2 months in advance to schedule this appointment.  You may see Skeet Latch, MD or one of the following Advanced Practice Providers on your designated Care Team:   Kerin Ransom, PA-C Roby Lofts, [place] . Sande Rives, PA-C

## 2018-09-12 NOTE — Progress Notes (Signed)
Virtual Visit via Video Note   This visit type was conducted due to national recommendations for restrictions regarding the COVID-19 Pandemic (e.g. social distancing) in an effort to limit this patient's exposure and mitigate transmission in our community.  Due to her co-morbid illnesses, this patient is at least at moderate risk for complications without adequate follow up.  This format is felt to be most appropriate for this patient at this time.  All issues noted in this document were discussed and addressed.  A limited physical exam was performed with this format.  Please refer to the patient's chart for her consent to telehealth for [facility].   Evaluation Performed:  Follow-up visit  Date:  09/12/2018   ID:  Kimberly Stein, Kimberly Stein January 01, 1956, MRN [id number]  Patient Location: Home Provider Location: Office  PCP:  Eulas Post, MD  Cardiologist:  Skeet Latch, MD  Electrophysiologist:  None   Chief Complaint:  hyperlipidemia  History of Present Illness:    Kimberly Stein is a 63 y.o. female with hypertension, hyperlipidemia, hypertriglyceridemia, and tobacco abuse who presents for follow.  Kimberly Stein was seen on 01/2015 after she had an abnormal stress test.  She underwent LHC on 02/25/15, which revealed no coronary artery disease.  She followed up with Krisitin Alvstad 04/2015 due to myalgias on atorvastatin.  Given her lack of ASCVD she was not a candidate for PCSK9 inhibitors.  However, she was willing to retry rosuvastatin.  She developed myalgias and had to stop it.  Her triglycerides were over 700 so she was started on fenofibrate.    Kimberly Stein continues to work with Dr. Elease Hashimoto on her lipids. She had lipids checked 07/2016 and triglycerides were much better.  However, her LDL remained elevated.  He recommended starting atorvastatin and she picked up the prescription but didn't start it.  She was eating a lot of cheese and salami in order to avoid foods that would  exacerbate her Celiac disease.  She has been trying to cut back on her smoking.  Currently she is still smoking about 1 pack of cigarettes daily.  She is thinking about trying to quit.  Overall she has been feeling well.  She struggles with pain in her back and is due for a repeat lumbar radiofrequency ablation.  This makes it difficult for her to sleep at night.  She has not experienced any chest pain and her breathing has been stable.  She denies lower extremity edema, orthopnea, or PND.   The patient does not have symptoms concerning for COVID-19 infection (fever, chills, cough, or new shortness of breath).    Past Medical History:  Diagnosis Date  . Allergy    sinus infections  . Anemia   . Arthritis   . Celiac disease   . GERD (gastroesophageal reflux disease)   . Heart murmur   . Hiatal hernia   . Hyperlipidemia   . Hypertension   . Meniere's disease   . Neuromuscular disorder (HCC)    fibromyalgia  . Thyroid disease    hypothyroid  . 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 CARDIAC CATHETERIZATION N/A 02/25/2015   Procedure: Left Heart Cath and Coronary Angiography;  Surgeon: Peter M Martinique, MD;  Location: [place] [facility];  Service: Cardiovascular;  Laterality: N/A;  . CERVICAL DISC SURGERY    . CHOLECYSTECTOMY    . COLONOSCOPY    . DILATION AND CURETTAGE OF UTERUS    .  HAND SURGERY     right hand; calcium deposit on wrist and under thumb and ganglion cyst and carpal tunnel  . lumbar radiofrequency nerve ablation     twice yearly  . MOUTH SURGERY  2016   couple times due to gum disease  . ROTATOR CUFF REPAIR  06/07/2015   left shoulder  . TOTAL ABDOMINAL HYSTERECTOMY    . UPPER GASTROINTESTINAL ENDOSCOPY       Current Meds  Medication Sig  . albuterol (PROAIR HFA) 108 (90 Base) MCG/ACT inhaler Inhale 2 puffs into the lungs 2 (two) times daily.  . Calcium Carb-Cholecalciferol (CALCIUM 600 + D PO) Take by mouth  2 (two) times daily.  . chlorthalidone (HYGROTON) 25 MG tablet Take 1 tablet (25 mg total) by mouth daily.  . cyclobenzaprine (FLEXERIL) 10 MG tablet TAKE 1 TABLET(10 MG) BY MOUTH AT BEDTIME  . estradiol (ESTRACE) 1 MG tablet TAKE 1 TABLET BY MOUTH EVERY DAY  . fenofibrate (TRICOR) 145 MG tablet TAKE 1 TABLET(145 MG) BY MOUTH DAILY  . KLOR-CON M10 10 MEQ tablet TAKE 1 TABLET BY MOUTH DAILY  . levothyroxine (SYNTHROID, LEVOTHROID) 150 MCG tablet Take 1 tablet (150 mcg total) by mouth daily before breakfast.  . losartan (COZAAR) 50 MG tablet TAKE 1 TABLET(50 MG) BY MOUTH DAILY  . methocarbamol (ROBAXIN) 500 MG tablet Take 1 tablet (500 mg total) by mouth daily as needed for muscle spasms.  [name] omeprazole (PRILOSEC) 20 MG capsule TAKE 1 CAPSULE BY MOUTH TWICE DAILY BEFORE A MEAL  . PSYLLIUM HUSK PO Take by mouth. Take 3 pills daily  . UNABLE TO FIND daily. Med Name: hemp heart   . [DISCONTINUED] MAGNESIUM MALATE PO Take by mouth daily.   Current Facility-Administered Medications for the 09/12/18 encounter (Telemedicine) with Skeet Latch, MD  Medication  . 0.9 %  sodium chloride infusion     Allergies:   Crestor [rosuvastatin]; Lipitor [atorvastatin calcium]; Nortriptyline; Nortriptyline hcl; and Vicodin [hydrocodone-acetaminophen]   Social History   Tobacco Use  . Smoking status: Current Every Day Smoker    Packs/day: 1.00    Years: 46.00    Pack years: 46.00    Types: Cigarettes  . Smokeless tobacco: Never Used  Substance Use Topics  . Alcohol use: No    Alcohol/week: 0.0 standard drinks  . Drug use: No     Family Hx: The patient's family history includes Breast cancer in her maternal aunt; Celiac disease in her mother and sister; Diabetes in her mother, sister, and sister; Heart disease in her mother; Meniere's disease in her sister; Prostate cancer in her father. There is no history of Colon cancer, Esophageal cancer, Stomach cancer, Rectal cancer, Liver cancer, or Pancreatic  cancer.  ROS:   Please see the history of present illness.     All other systems reviewed and are negative.   Prior CV studies:   The following studies were reviewed today:  LHC 02/25/15:    Dominance: Right   Left Main  The vessel was injected is normal in caliber is angiographically normal.     Left Anterior Descending  The vessel was injected is normal in caliber . The vessel is tortuous. Minimal calcification in the proximal vessel.     Left Circumflex  The vessel was injected is normal in caliber is angiographically normal.     Right Coronary Artery  The vessel was injected is normal in caliber is angiographically normal. Minimal calcification in the proximal vessel.  Labs/Other Tests and Data Reviewed:    EKG:  No ECG reviewed.  Recent Labs: 08/08/2018: ALT 19; BUN 18; Creatinine, Ser 0.74; Hemoglobin 13.6; Platelets 190.0; Potassium 4.0; Sodium 139; TSH 0.44   Recent Lipid Panel Lab Results  Component Value Date/Time   CHOL 237 (H) 08/08/2018 09:46 AM   TRIG 166.0 (H) 08/08/2018 09:46 AM   HDL 56.00 08/08/2018 09:46 AM   CHOLHDL 4 08/08/2018 09:46 AM   LDLCALC 148 (H) 08/08/2018 09:46 AM   LDLDIRECT 168 (H) 02/19/2015 07:55 AM    Wt Readings from Last 3 Encounters:  08/08/18 151 lb 12.8 oz (68.9 kg)  07/25/18 151 lb (68.5 kg)  07/06/18 150 lb (68 kg)     Objective:    Ht 5' 3"  (1.6 m)   BMI 26.89 kg/m  GENERAL: Well-appearing.  No acute distress. HEENT: Pupils equal round.  Oral mucosa unremarkable NECK:  No jugular venous distention, no visible thyromegaly EXT:  No edema, no cyanosis no clubbing SKIN:  No rashes no nodules NEURO:  Speech fluent.  Cranial nerves grossly intact.  Moves all 4 extremities freely PSYCH:  Cognitively intact, oriented to person place and time   ASSESSMENT & PLAN:    # Hypertension:  Kimberly Stein is unable to check her blood pressure today.  However she reports that it has been generally well-controlled.   Continue losartan and chlorthalidone.  # Tobacco abuse: Kimberly Stein was encouraged to quit smoking.  We have previously tried Chantix and patches.  She wants to work on just slowly cutting back.  Dr. Elease Hashimoto has already scheduled her for screening chest CT.  This will be completed once the COVID-19 restrictions have been lifted.  # Hyperlipidemia: # Hypertriglyceridemia: Triglycerides are much better since adding fenofibrate.  She has tolerated rosuvastatin in the past.  Dr. Elease Hashimoto recommended atorvastatin which is very reasonable.  However she wants to work on her diet first.  She is scheduled to have repeat lipids 09/2018.  COVID-19 Education: The signs and symptoms of COVID-19 were discussed with the patient and how to seek care for testing (follow up with PCP or arrange E-visit).  The importance of social distancing was discussed today.   Time:   Today, I have spent 17 minutes with the patient with telehealth technology discussing the above problems.     Medication Adjustments/Labs and Tests Ordered: Current medicines are reviewed at length with the patient today.  Concerns regarding medicines are outlined above.   Tests Ordered: No orders of the defined types were placed in this encounter.   Medication Changes: No orders of the defined types were placed in this encounter.   Disposition:  Follow up in 1 year(s)  Signed, Skeet Latch, MD  09/12/2018 9:49 AM    [place]

## 2018-10-08 ENCOUNTER — Other Ambulatory Visit: Payer: Self-pay | Admitting: Acute Care

## 2018-10-08 DIAGNOSIS — Z122 Encounter for screening for malignant neoplasm of respiratory organs: Secondary | ICD-10-CM

## 2018-10-09 ENCOUNTER — Other Ambulatory Visit: Payer: Self-pay

## 2018-10-09 ENCOUNTER — Other Ambulatory Visit (INDEPENDENT_AMBULATORY_CARE_PROVIDER_SITE_OTHER)

## 2018-10-09 DIAGNOSIS — E785 Hyperlipidemia, unspecified: Secondary | ICD-10-CM

## 2018-10-09 LAB — HEPATIC FUNCTION PANEL
ALT: 20 U/L (ref 0–35)
AST: 25 U/L (ref 0–37)
Albumin: 4.7 g/dL (ref 3.5–5.2)
Alkaline Phosphatase: 28 U/L — ABNORMAL LOW (ref 39–117)
Bilirubin, Direct: 0.1 mg/dL (ref 0.0–0.3)
Total Bilirubin: 0.5 mg/dL (ref 0.2–1.2)
Total Protein: 7.5 g/dL (ref 6.0–8.3)

## 2018-10-09 LAB — LIPID PANEL
Cholesterol: 246 mg/dL — ABNORMAL HIGH (ref 0–200)
HDL: 50.9 mg/dL (ref >39.00)
LDL Cholesterol: 157 mg/dL — ABNORMAL HIGH (ref 0–99)
NonHDL: 194.85
Total CHOL/HDL Ratio: 5
Triglycerides: 191 mg/dL — ABNORMAL HIGH (ref 0.0–149.0)
VLDL: 38.2 mg/dL (ref 0.0–40.0)

## 2018-10-18 ENCOUNTER — Other Ambulatory Visit: Payer: Self-pay | Admitting: Rheumatology

## 2018-10-18 NOTE — Telephone Encounter (Signed)
Last Visit: 07/06/18 Next Visit: 01/04/19  Okay to refill per Dr. Estanislado Pandy

## 2018-11-04 ENCOUNTER — Other Ambulatory Visit: Payer: Self-pay | Admitting: Family Medicine

## 2018-12-26 NOTE — Progress Notes (Deleted)
Office Visit Note  Patient: Kimberly Stein             Date of Birth: 01-26-1956           MRN: [id number]             PCP: Eulas Post, MD Referring: Eulas Post, MD Visit Date: 01/09/2019 Occupation: @GUAROCC @  Subjective:  No chief complaint on file.   History of Present Illness: Kimberly Stein is a 63 y.o. female ***   Activities of Daily Living:  Patient reports morning stiffness for *** {minute/hour:19697}.   Patient {ACTIONS;DENIES/REPORTS:21021675::"Denies"} nocturnal pain.  Difficulty dressing/grooming: {ACTIONS;DENIES/REPORTS:21021675::"Denies"} Difficulty climbing stairs: {ACTIONS;DENIES/REPORTS:21021675::"Denies"} Difficulty getting out of chair: {ACTIONS;DENIES/REPORTS:21021675::"Denies"} Difficulty using hands for taps, buttons, cutlery, and/or writing: {ACTIONS;DENIES/REPORTS:21021675::"Denies"}  No Rheumatology ROS completed.   PMFS History:  Patient Active Problem List   Diagnosis Date Noted  . Other fatigue 06/13/2016  . Primary insomnia 06/13/2016  . Primary osteoarthritis of both hands 06/13/2016  . Trochanteric bursitis of both hips 06/13/2016  . DDD cervical spine status post fusion 06/13/2016  . Osteoarthritis of lumbar spine 06/13/2016  . Tendinopathy of right rotator cuff 06/13/2016  . Chest pain 02/25/2015  . Abnormal nuclear stress test 02/25/2015  . Hyperlipidemia 07/17/2013  . Osteopenia 01/28/2012  . 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 CELIAC SPRUE 09/29/2009  . Hypothyroidism 09/07/2009  . Meniere's disease 09/07/2009  . Essential hypertension 09/07/2009  . RUQ PAIN 09/07/2009  . Fibromyalgia 06/12/2009  . CERVICALGIA 06/03/2009  . ANEMIA-IRON DEFICIENCY 10/21/2008  . GERD 10/21/2008  . DIZZINESS 10/21/2008  . COLONIC POLYPS, HX OF 10/21/2008    Past Medical History:  Diagnosis Date  . Allergy    sinus infections  . Anemia   . Arthritis   . Celiac disease   . GERD (gastroesophageal reflux disease)   . Heart  murmur   . Hiatal hernia   . Hyperlipidemia   . Hypertension   . Meniere's disease   . Neuromuscular disorder (HCC)    fibromyalgia  . Thyroid disease    hypothyroid  . 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 Celiac disease Mother   . Diabetes Mother   . Heart disease Mother   . Prostate cancer Father   . Breast cancer Maternal Aunt   . Diabetes Sister   . Meniere's disease Sister   . Celiac disease Sister   . Diabetes Sister   . Colon cancer Neg Hx   . Esophageal cancer Neg Hx   . Stomach cancer Neg Hx   . Rectal cancer Neg Hx   . Liver cancer Neg Hx   . Pancreatic cancer Neg Hx    Past Surgical History:  Procedure Laterality Date  . CARDIAC CATHETERIZATION N/A 02/25/2015   Procedure: Left Heart Cath and Coronary Angiography;  Surgeon: Peter M Martinique, MD;  Location: [place] [facility];  Service: Cardiovascular;  Laterality: N/A;  . CERVICAL DISC SURGERY    . CHOLECYSTECTOMY    . COLONOSCOPY    . DILATION AND CURETTAGE OF UTERUS    . HAND SURGERY     right hand; calcium deposit on wrist and under thumb and ganglion cyst and carpal tunnel  . lumbar radiofrequency nerve ablation     twice yearly  . MOUTH SURGERY  2016   couple times due to gum disease  . ROTATOR CUFF REPAIR  06/07/2015   left shoulder  . TOTAL ABDOMINAL HYSTERECTOMY    .  UPPER GASTROINTESTINAL ENDOSCOPY     Social History   Social History Narrative  . Not on file   Immunization History  Administered Date(s) Administered  . Pneumococcal Conjugate-13 07/17/2013  . Tdap 06/26/2013  . Zoster 07/17/2013     Objective: Vital Signs: There were no vitals taken for this visit.   Physical Exam   Musculoskeletal Exam: ***  CDAI Exam: CDAI Score: - Patient Global: -; Provider Global: - Swollen: -; Tender: - Joint Exam   No joint exam has been documented for this visit   There is currently no information documented on the homunculus. Go to  the Rheumatology activity and complete the homunculus joint exam.  Investigation: No additional findings.  Imaging: No results found.  Recent Labs: Lab Results  Component Value Date   WBC 6.9 08/08/2018   HGB 13.6 08/08/2018   PLT 190.0 08/08/2018   NA 139 08/08/2018   K 4.0 08/08/2018   CL 100 08/08/2018   CO2 30 08/08/2018   GLUCOSE 98 08/08/2018   BUN 18 08/08/2018   CREATININE 0.74 08/08/2018   BILITOT 0.5 10/09/2018   ALKPHOS 28 (L) 10/09/2018   AST 25 10/09/2018   ALT 20 10/09/2018   PROT 7.5 10/09/2018   ALBUMIN 4.7 10/09/2018   CALCIUM 10.4 08/08/2018   GFRAA 85 12/12/2016    Speciality Comments: No specialty comments available.  Procedures:  No procedures performed Allergies: Crestor [rosuvastatin], Lipitor [atorvastatin calcium], Nortriptyline, Nortriptyline hcl, and Vicodin [hydrocodone-acetaminophen]   Assessment / Plan:     Visit Diagnoses: No diagnosis found.  Orders: No orders of the defined types were placed in this encounter.  No orders of the defined types were placed in this encounter.   Face-to-face time spent with patient was *** minutes. Greater than 50% of time was spent in counseling and coordination of care.  Follow-Up Instructions: No follow-ups on file.   Earnestine Mealing, CMA  Note - This record has been created using Editor, commissioning.  Chart creation errors have been sought, but may not always  have been located. Such creation errors do not reflect on  the standard of medical care.

## 2019-01-04 ENCOUNTER — Ambulatory Visit: Payer: Self-pay | Admitting: Physician Assistant

## 2019-01-09 ENCOUNTER — Ambulatory Visit: Admitting: Physician Assistant

## 2019-01-14 ENCOUNTER — Other Ambulatory Visit: Payer: Self-pay | Admitting: Rheumatology

## 2019-01-14 NOTE — Telephone Encounter (Signed)
Last Visit: 07/06/18 Next Visit: 02/11/19  Okay to refill per Dr. Estanislado Pandy

## 2019-01-22 ENCOUNTER — Other Ambulatory Visit: Payer: Self-pay | Admitting: Family Medicine

## 2019-01-23 ENCOUNTER — Telehealth: Payer: Self-pay | Admitting: *Deleted

## 2019-01-23 NOTE — Telephone Encounter (Signed)
Called patient to schedule CT LCS pt is not wanting to come for appt due to COVID restrictions. Will make office aware patient to call to schedule when ready.

## 2019-01-29 NOTE — Progress Notes (Deleted)
Office Visit Note  Patient: Kimberly Stein             Date of Birth: Dec 21, 1955           MRN: [id number]             PCP: Eulas Post, MD Referring: Eulas Post, MD Visit Date: 02/11/2019 Occupation: @GUAROCC @  Subjective:  No chief complaint on file.   History of Present Illness: Kimberly Stein is a 63 y.o. female ***   Activities of Daily Living:  Patient reports morning stiffness for *** {minute/hour:19697}.   Patient {ACTIONS;DENIES/REPORTS:21021675::"Denies"} nocturnal pain.  Difficulty dressing/grooming: {ACTIONS;DENIES/REPORTS:21021675::"Denies"} Difficulty climbing stairs: {ACTIONS;DENIES/REPORTS:21021675::"Denies"} Difficulty getting out of chair: {ACTIONS;DENIES/REPORTS:21021675::"Denies"} Difficulty using hands for taps, buttons, cutlery, and/or writing: {ACTIONS;DENIES/REPORTS:21021675::"Denies"}  No Rheumatology ROS completed.   PMFS History:  Patient Active Problem List   Diagnosis Date Noted  . Other fatigue 06/13/2016  . Primary insomnia 06/13/2016  . Primary osteoarthritis of both hands 06/13/2016  . Trochanteric bursitis of both hips 06/13/2016  . DDD cervical spine status post fusion 06/13/2016  . Osteoarthritis of lumbar spine 06/13/2016  . Tendinopathy of right rotator cuff 06/13/2016  . Chest pain 02/25/2015  . Abnormal nuclear stress test 02/25/2015  . Hyperlipidemia 07/17/2013  . Osteopenia 01/28/2012  . 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 CELIAC SPRUE 09/29/2009  . Hypothyroidism 09/07/2009  . Meniere's disease 09/07/2009  . Essential hypertension 09/07/2009  . RUQ PAIN 09/07/2009  . Fibromyalgia 06/12/2009  . CERVICALGIA 06/03/2009  . ANEMIA-IRON DEFICIENCY 10/21/2008  . GERD 10/21/2008  . DIZZINESS 10/21/2008  . COLONIC POLYPS, HX OF 10/21/2008    Past Medical History:  Diagnosis Date  . Allergy    sinus infections  . Anemia   . Arthritis   . Celiac disease   . GERD (gastroesophageal reflux disease)   . Heart  murmur   . Hiatal hernia   . Hyperlipidemia   . Hypertension   . Meniere's disease   . Neuromuscular disorder (HCC)    fibromyalgia  . Thyroid disease    hypothyroid  . 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 Celiac disease Mother   . Diabetes Mother   . Heart disease Mother   . Prostate cancer Father   . Breast cancer Maternal Aunt   . Diabetes Sister   . Meniere's disease Sister   . Celiac disease Sister   . Diabetes Sister   . Colon cancer Neg Hx   . Esophageal cancer Neg Hx   . Stomach cancer Neg Hx   . Rectal cancer Neg Hx   . Liver cancer Neg Hx   . Pancreatic cancer Neg Hx    Past Surgical History:  Procedure Laterality Date  . CARDIAC CATHETERIZATION N/A 02/25/2015   Procedure: Left Heart Cath and Coronary Angiography;  Surgeon: Peter M Martinique, MD;  Location: [place] [facility];  Service: Cardiovascular;  Laterality: N/A;  . CERVICAL DISC SURGERY    . CHOLECYSTECTOMY    . COLONOSCOPY    . DILATION AND CURETTAGE OF UTERUS    . HAND SURGERY     right hand; calcium deposit on wrist and under thumb and ganglion cyst and carpal tunnel  . lumbar radiofrequency nerve ablation     twice yearly  . MOUTH SURGERY  2016   couple times due to gum disease  . ROTATOR CUFF REPAIR  06/07/2015   left shoulder  . TOTAL ABDOMINAL HYSTERECTOMY    .  UPPER GASTROINTESTINAL ENDOSCOPY     Social History   Social History Narrative  . Not on file   Immunization History  Administered Date(s) Administered  . Pneumococcal Conjugate-13 07/17/2013  . Tdap 06/26/2013  . Zoster 07/17/2013     Objective: Vital Signs: There were no vitals taken for this visit.   Physical Exam   Musculoskeletal Exam: ***  CDAI Exam: CDAI Score: - Patient Global: -; Provider Global: - Swollen: -; Tender: - Joint Exam   No joint exam has been documented for this visit   There is currently no information documented on the homunculus. Go to  the Rheumatology activity and complete the homunculus joint exam.  Investigation: No additional findings.  Imaging: No results found.  Recent Labs: Lab Results  Component Value Date   WBC 6.9 08/08/2018   HGB 13.6 08/08/2018   PLT 190.0 08/08/2018   NA 139 08/08/2018   K 4.0 08/08/2018   CL 100 08/08/2018   CO2 30 08/08/2018   GLUCOSE 98 08/08/2018   BUN 18 08/08/2018   CREATININE 0.74 08/08/2018   BILITOT 0.5 10/09/2018   ALKPHOS 28 (L) 10/09/2018   AST 25 10/09/2018   ALT 20 10/09/2018   PROT 7.5 10/09/2018   ALBUMIN 4.7 10/09/2018   CALCIUM 10.4 08/08/2018   GFRAA 85 12/12/2016    Speciality Comments: No specialty comments available.  Procedures:  No procedures performed Allergies: Crestor [rosuvastatin], Lipitor [atorvastatin calcium], Nortriptyline, Nortriptyline hcl, and Vicodin [hydrocodone-acetaminophen]   Assessment / Plan:     Visit Diagnoses: No diagnosis found.  Orders: No orders of the defined types were placed in this encounter.  No orders of the defined types were placed in this encounter.   Face-to-face time spent with patient was *** minutes. Greater than 50% of time was spent in counseling and coordination of care.  Follow-Up Instructions: No follow-ups on file.   Earnestine Mealing, CMA  Note - This record has been created using Editor, commissioning.  Chart creation errors have been sought, but may not always  have been located. Such creation errors do not reflect on  the standard of medical care.

## 2019-02-11 ENCOUNTER — Encounter: Payer: Self-pay | Admitting: Physician Assistant

## 2019-02-11 ENCOUNTER — Other Ambulatory Visit: Payer: Self-pay

## 2019-02-11 ENCOUNTER — Telehealth (INDEPENDENT_AMBULATORY_CARE_PROVIDER_SITE_OTHER): Admitting: Rheumatology

## 2019-02-11 DIAGNOSIS — M19041 Primary osteoarthritis, right hand: Secondary | ICD-10-CM | POA: Diagnosis not present

## 2019-02-11 DIAGNOSIS — M67911 Unspecified disorder of synovium and tendon, right shoulder: Secondary | ICD-10-CM

## 2019-02-11 DIAGNOSIS — Z8669 Personal history of other diseases of the nervous system and sense organs: Secondary | ICD-10-CM

## 2019-02-11 DIAGNOSIS — Z8719 Personal history of other diseases of the digestive system: Secondary | ICD-10-CM

## 2019-02-11 DIAGNOSIS — M7062 Trochanteric bursitis, left hip: Secondary | ICD-10-CM

## 2019-02-11 DIAGNOSIS — M7061 Trochanteric bursitis, right hip: Secondary | ICD-10-CM | POA: Diagnosis not present

## 2019-02-11 DIAGNOSIS — M5136 Other intervertebral disc degeneration, lumbar region: Secondary | ICD-10-CM

## 2019-02-11 DIAGNOSIS — M797 Fibromyalgia: Secondary | ICD-10-CM | POA: Diagnosis not present

## 2019-02-11 DIAGNOSIS — M5030 Other cervical disc degeneration, unspecified cervical region: Secondary | ICD-10-CM

## 2019-02-11 DIAGNOSIS — M19042 Primary osteoarthritis, left hand: Secondary | ICD-10-CM

## 2019-02-11 DIAGNOSIS — Z8639 Personal history of other endocrine, nutritional and metabolic disease: Secondary | ICD-10-CM

## 2019-02-11 DIAGNOSIS — F5101 Primary insomnia: Secondary | ICD-10-CM

## 2019-02-11 DIAGNOSIS — Z8679 Personal history of other diseases of the circulatory system: Secondary | ICD-10-CM

## 2019-02-11 DIAGNOSIS — Z862 Personal history of diseases of the blood and blood-forming organs and certain disorders involving the immune mechanism: Secondary | ICD-10-CM

## 2019-02-11 DIAGNOSIS — R5383 Other fatigue: Secondary | ICD-10-CM

## 2019-02-11 DIAGNOSIS — M8589 Other specified disorders of bone density and structure, multiple sites: Secondary | ICD-10-CM

## 2019-02-11 NOTE — Progress Notes (Signed)
Virtual Visit via Telephone Note  I connected with Kimberly Stein on 02/11/19 at  3:20 PM EDT by telephone and verified that I am speaking with the correct person using two identifiers.  Location: Patient: Home  Provider: Clinic  This service was conducted via virtual visit.  The patient was located at home. I was located in my office.  Consent was obtained prior to the virtual visit and is aware of possible charges through their insurance for this visit.  The patient is an established patient.  Dr. Estanislado Pandy, MD conducted the virtual visit and Hazel Sams, PA-C acted as scribe during the service.  Office staff helped with scheduling follow up visits after the service was conducted.   I discussed the limitations, risks, security and privacy concerns of performing an evaluation and management service by telephone and the availability of in person appointments. I also discussed with the patient that there may be a patient responsible charge related to this service. The patient expressed understanding and agreed to proceed.  CC: lower back pain  History of Present Illness: Patient is a 63 year old female with a past medical history of fibromyalgia, osteoarthritis, and DDD.  She continues to have right trochanteric bursitis.  She has had cortisone injections in the past, which were ineffective.  She continues to have generalized muscle aches and muscle tenderness due to fibromyalgia.  She has chronic fatigue related to insomnia. She experiences nocturnal pain in her neck and lower back, which causes interrupted sleep at night.  She takes flexeril 10 mg po at bedtime, which helps with muscle spasms and insomnia.  She has also been taking magnesium malate which has been effective.  She discontinued robaxin due to finding it to be ineffective.   She continues to see Dr. Maryjean Ka on a regular basis.  She had a nerve ablation in the lumbar spine on 11/23/18, but she states her lower back pain has returned.    Review of Systems  Constitutional: Positive for malaise/fatigue. Negative for fever.  HENT:       +Mouth dryness-uses biotene products  Eyes: Negative for photophobia, pain, discharge and redness.  Respiratory: Negative for cough, shortness of breath and wheezing.   Cardiovascular: Negative for chest pain and palpitations.  Gastrointestinal: Negative for blood in stool, constipation and diarrhea.  Genitourinary: Negative for dysuria.  Musculoskeletal: Positive for back pain, joint pain, myalgias and neck pain.       +Morning stiffness   Skin: Negative for rash.  Neurological: Negative for dizziness and headaches.  Psychiatric/Behavioral: Negative for depression. The patient has insomnia. The patient is not nervous/anxious.       Observations/Objective: Physical Exam  Constitutional: She is oriented to person, place, and time.  Neurological: She is alert and oriented to person, place, and time.  Psychiatric: Mood, memory, affect and judgment normal.   Patient reports morning stiffness for 45  minutes.   Patient reports nocturnal pain.  Difficulty dressing/grooming: Denies Difficulty climbing stairs: Denies Difficulty getting out of chair: Denies Difficulty using hands for taps, buttons, cutlery, and/or writing: Denies    Assessment and Plan: Visit Diagnoses: Fibromyalgia - She has generalized muscle aches and muscle tenderness due to fibromyalgia. She has intermittent right trochanteric bursitis. She declined a cortisone injection.  She was encouraged to perform stretching exercises. She has chronic fatigue related to insomnia.  She has interrupted sleep at night due to discomfort in her neck and lower back.  She sees Dr. Maryjean Ka on a regular basis.  She had a spinal nerve ablation on 11/23/18, but she states the pain has returned. She states flexeril 10 mg po at bedtime is very effective, but she discontinued Robaxin due to it being ineffective.  She has been taking magnesium  malate at bedtime, which has resolved the muscle cramps she was experiencing.  She was encouraged to stay active and exercise on a regular basis.  She will follow up in 6 months.  Trochanteric bursitis of both hips: She has intermittent trochanteric bursitis of the right hip.  She had a cortisone injection in the past performed by Dr. Durward Fortes, which was ineffective.  She was encouraged to perform stretching exercises.   Primary osteoarthritis of both hands: She has chronic osteoarthritis changes in both hands. She has intermittent pain in both hands.  No joint swelling currently.  Joint protection and muscle strengthening were discussed.   Tendinopathy of right rotator cuff: She has no discomfort at this time.   DDD (degenerative disc disease), cervical - s/p fusion: She chronic neck pain.    DDD (degenerative disc disease), lumbar: Chronic pain.  She is followed by Dr. Maryjean Ka.  She had a nerve ablation performed on 11/23/18.  She states the pain has returned. She experiences nocturnal pain. She takes flexeril 10 mg po at bedtime, which she states is very effective and helps her sleep at night. She did not notice any improvement when taking Robaxin, so she discontinued.   Primary insomnia: She experiences nocturnal pain, which causes interrupted sleep at night.  She takes flexeril 10 mg po at bedtime. She does not need a refill at this time.   Other fatigue: Chronic but stable.   Osteopenia of multiple sites: She takes a calcium and vitamin D supplement.    Other medical conditions are listed as follows:   History of hypothyroidism  History of hyperlipidemia  History of gastroesophageal reflux (GERD)  History of anemia  History of hypertension  History of Meniere's disease    Follow Up Instructions: She will follow up in 6 months.   I discussed the assessment and treatment plan with the patient. The patient was provided an opportunity to ask questions and all were  answered. The patient agreed with the plan and demonstrated an understanding of the instructions.   The patient was advised to call back or seek an in-person evaluation if the symptoms worsen or if the condition fails to improve as anticipated.  I provided 25 minutes of non-face-to-face time during this encounter.   Bo Merino, MD   Scribed by-  Hazel Sams PA-C

## 2019-02-14 ENCOUNTER — Other Ambulatory Visit: Payer: Self-pay | Admitting: Family Medicine

## 2019-04-14 ENCOUNTER — Other Ambulatory Visit: Payer: Self-pay | Admitting: Rheumatology

## 2019-04-15 ENCOUNTER — Telehealth: Payer: Self-pay | Admitting: Cardiovascular Disease

## 2019-04-15 NOTE — Telephone Encounter (Signed)
New message  Patient c/o Palpitations:  High priority if patient c/o lightheadedness, shortness of breath, or chest pain  1) How long have you had palpitations/irregular HR/ Afib? Are you having the symptoms now? No   2) Are you currently experiencing lightheadedness, SOB or CP? Gone during the day, happens during the night  3) Do you have a history of afib (atrial fibrillation) or irregular heart rhythm? No  4) Have you checked your BP or HR? (document readings if available): No  5) Are you experiencing any other symptoms? No

## 2019-04-15 NOTE — Telephone Encounter (Signed)
Scheduled virtual visit tomorrow with Arnold Long DNP

## 2019-04-15 NOTE — Telephone Encounter (Signed)
Last Visit: 02/11/2019 telemedicine  Next Visit: message sent to the front desk to schedule.   Last fill: 01/14/2019 (90d)  Okay to refill per Dr. Estanislado Pandy.

## 2019-04-15 NOTE — Telephone Encounter (Signed)
Returned call to patient of Dr. Oval Linsey who reports palpitations mostly at night. She does not feel palps as much during the day. She only eats once daily - she drinks a frappucino in AM and she notices palps after eating a meal around 4pm. She does not have a way to check BP/HR at home but reports that her pulse is not fast. She describes occasional skipping beats. She does not have chest pain, SOB, lightheadedness/dizzines. She reports more burping and uncomfortable feeling in top of stomach. She takes PPI and has GERD/celiac disease. She reports drinking water can help her symptoms. Advised that her symptoms sound more GI related  She tried to make appt with GI - cannot see PA there until 12/10  She would like Dr. Oval Linsey to review her concerns - message routed

## 2019-04-15 NOTE — Progress Notes (Signed)
Virtual Visit via Telephone Note   This visit type was conducted due to national recommendations for restrictions regarding the COVID-19 Pandemic (e.g. social distancing) in an effort to limit this patient's exposure and mitigate transmission in our community.  Due to her co-morbid illnesses, this patient is at least at moderate risk for complications without adequate follow up.  This format is felt to be most appropriate for this patient at this time.  The patient did not have access to video technology/had technical difficulties with video requiring transitioning to audio format only (telephone).  All issues noted in this document were discussed and addressed.  No physical exam could be performed with this format.  Please refer to the patient's chart for her  consent to telehealth for [facility].   Date:  04/16/2019   ID:  Kimberly Stein, DOB 1955/06/24, MRN [id number]  Patient Location: Home Provider Location: Home  PCP:  Eulas Post, MD  Cardiologist:  Skeet Latch, MD  Electrophysiologist:  None   Evaluation Performed:  Follow-Up Visit  Chief Complaint:  Nocturnal Palpitations   History of Present Illness:    Kimberly Stein is a 63 y.o. female with we are following for ongoing assessment and management of hypertension, palpitations, hyperlipidemia, with other history to include hypertriglyceridemia, celiac disease, GERD and tobacco abuse.  The patient had a left heart catheterization dated 02/25/2015 which did not reveal any coronary artery disease.  She does have some myalgias related to atorvastatin therapy.  Because of her lack of ASCVD she was not found to be a candidate for PCSK9 inhibition.  She is working with Dr.Burchette lipid management.   She was last seen by Dr. Oval Linsey on 09/12/2018 and complained of chronic lower back pain.  She was due to follow-up with orthopedics to have a lumbar radiofrequency ablation.  She unfortunately continues to smoke.  She  called our office on 04/15/2019 with complaints of worsening palpitations which she normally feels at nighttime.  She does drink a Frappuccino in the morning, but does not have any caffeine later throughout the day.  She was added to my schedule today to discuss her symptoms.  She is describing a lot of burping and chest pressure substernally.  She has also some extra palpitations.  She only eats 1 meal a day, and may eat some snacks early in the day.  She denies fatigue, dyspnea on exertion.  The patient does not have symptoms concerning for COVID-19 infection (fever, chills, cough, or new shortness of breath).    Past Medical History:  Diagnosis Date  . Allergy    sinus infections  . Anemia   . Arthritis   . Celiac disease   . GERD (gastroesophageal reflux disease)   . Heart murmur   . Hiatal hernia   . Hyperlipidemia   . Hypertension   . Meniere's disease   . Neuromuscular disorder (HCC)    fibromyalgia  . Thyroid disease    hypothyroid  . 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 CARDIAC CATHETERIZATION N/A 02/25/2015   Procedure: Left Heart Cath and Coronary Angiography;  Surgeon: Peter M Martinique, MD;  Location: [place] [facility];  Service: Cardiovascular;  Laterality: N/A;  . CERVICAL DISC SURGERY    . CHOLECYSTECTOMY    . COLONOSCOPY    . DILATION AND CURETTAGE OF UTERUS    . HAND SURGERY     right hand; calcium deposit on wrist and under  thumb and ganglion cyst and carpal tunnel  . lumbar radiofrequency nerve ablation     twice yearly  . MOUTH SURGERY  2016   couple times due to gum disease  . ROTATOR CUFF REPAIR  06/07/2015   left shoulder  . TOTAL ABDOMINAL HYSTERECTOMY    . UPPER GASTROINTESTINAL ENDOSCOPY       Current Meds  Medication Sig  . albuterol (VENTOLIN HFA) 108 (90 Base) MCG/ACT inhaler INHALE 2 PUFFS INTO THE LUNGS TWICE DAILY  . Calcium Carb-Cholecalciferol (CALCIUM 600 + D PO) Take by mouth 2  (two) times daily.  . chlorthalidone (HYGROTON) 25 MG tablet Take 1 tablet (25 mg total) by mouth daily.  . cyclobenzaprine (FLEXERIL) 10 MG tablet TAKE 1 TABLET BY MOUTH EVERY NIGHT AT BEDTIME  . estradiol (ESTRACE) 1 MG tablet TAKE 1 TABLET BY MOUTH EVERY DAY  . fenofibrate (TRICOR) 145 MG tablet TAKE 1 TABLET(145 MG) BY MOUTH DAILY  . KLOR-CON M10 10 MEQ tablet TAKE 1 TABLET BY MOUTH DAILY  . levothyroxine (SYNTHROID, LEVOTHROID) 150 MCG tablet Take 1 tablet (150 mcg total) by mouth daily before breakfast.  . losartan (COZAAR) 50 MG tablet TAKE 1 TABLET(50 MG) BY MOUTH DAILY  . omeprazole (PRILOSEC) 20 MG capsule TAKE 1 CAPSULE BY MOUTH TWICE DAILY BEFORE A MEAL  . PSYLLIUM HUSK PO Take by mouth. Take 3 pills daily   Current Facility-Administered Medications for the 04/16/19 encounter (Telemedicine) with Lendon Colonel, NP  Medication  . 0.9 %  sodium chloride infusion     Allergies:   Crestor [rosuvastatin], Lipitor [atorvastatin calcium], Nortriptyline, Nortriptyline hcl, and Vicodin [hydrocodone-acetaminophen]   Social History   Tobacco Use  . Smoking status: Current Every Day Smoker    Packs/day: 1.00    Years: 46.00    Pack years: 46.00    Types: Cigarettes  . Smokeless tobacco: Never Used  Substance Use Topics  . Alcohol use: No    Alcohol/week: 0.0 standard drinks  . Drug use: No     Family Hx: The patient's family history includes Breast cancer in her maternal aunt; Celiac disease in her mother and sister; Diabetes in her mother, sister, and sister; Heart disease in her mother; Meniere's disease in her sister; Prostate cancer in her father. There is no history of Colon cancer, Esophageal cancer, Stomach cancer, Rectal cancer, Liver cancer, or Pancreatic cancer.  ROS:   Please see the history of present illness.    All other systems reviewed and are negative.   Prior CV studies:   The following studies were reviewed today [facility] 02/25/2015 1. No significant CAD  2. Normal LV EDP.   Labs/Other Tests and Data Reviewed:    EKG:  No ECG reviewed.  Recent Labs: 08/08/2018: BUN 18; Creatinine, Ser 0.74; Hemoglobin 13.6; Platelets 190.0; Potassium 4.0; Sodium 139; TSH 0.44 10/09/2018: ALT 20   Recent Lipid Panel Lab Results  Component Value Date/Time   CHOL 246 (H) 10/09/2018 07:58 AM   TRIG 191.0 (H) 10/09/2018 07:58 AM   HDL 50.90 10/09/2018 07:58 AM   CHOLHDL 5 10/09/2018 07:58 AM   LDLCALC 157 (H) 10/09/2018 07:58 AM   LDLDIRECT 168 (H) 02/19/2015 07:55 AM    Wt Readings from Last 3 Encounters:  04/16/19 150 lb (68 kg)  08/08/18 151 lb 12.8 oz (68.9 kg)  07/25/18 151 lb (68.5 kg)     Objective:    Vital Signs:  Ht 5' 3"  (1.6 m)   Wt 150 lb (68 kg)  BMI 26.57 kg/m    VITAL SIGNS:  reviewed  ASSESSMENT & PLAN:    1. Palpitations: She is on PPI which may cause some decrease in Mg+. She had been on magnesium in the past which was helpful but she stopped taking it.  She will restart the magnesium 250 mg daily,as she has this bottle now. I think the palpitations are benign and have given her reassurance about this.  She verbalizes understanding.   2. Chest Pressure: Given normal cardiac cath in 2016 doubt cardiac etiology. She has significant hx of GI issues to include hiatal hernia. I have asked her to see her GI provider for recommendations. May need EDG to allow better evaluation of esophagus and excessive burping.  She can take Gas X or Mylanta for symptomatic relief.   3. Hypertension: Currently controlled except when she is anxious. Should continue on losartan 50 mg daily as directed.   COVID-19 Education: The signs and symptoms of COVID-19 were discussed with the patient and how to seek care for testing (follow up with PCP or arrange E-visit).  The importance of social distancing was discussed today.  Time:   Today, I have spent 15  minutes with the patient with telehealth technology discussing the above problems.      Medication Adjustments/Labs and Tests Ordered: Current medicines are reviewed at length with the patient today.  Concerns regarding medicines are outlined above.   Tests Ordered: No orders of the defined types were placed in this encounter.   Medication Changes: No orders of the defined types were placed in this encounter.   Disposition:  Follow up 6 months   Signed, Phill Myron. West Pugh, ANP, AACC  04/16/2019 8:22 AM    [facility]

## 2019-04-16 ENCOUNTER — Encounter: Payer: Self-pay | Admitting: Adult Health

## 2019-04-16 ENCOUNTER — Telehealth (INDEPENDENT_AMBULATORY_CARE_PROVIDER_SITE_OTHER): Admitting: Adult Health

## 2019-04-16 VITALS — Ht 63.0 in | Wt 150.0 lb

## 2019-04-16 DIAGNOSIS — R0789 Other chest pain: Secondary | ICD-10-CM

## 2019-04-16 DIAGNOSIS — I10 Essential (primary) hypertension: Secondary | ICD-10-CM

## 2019-04-16 DIAGNOSIS — R002 Palpitations: Secondary | ICD-10-CM

## 2019-04-16 NOTE — Patient Instructions (Signed)
Medication Instructions:  Continue current medications  *If you need a refill on your cardiac medications before your next appointment, please call your pharmacy*  Lab Work: None Ordered  Testing/Procedures: None Ordered  Follow-Up: At [facility], you and your health needs are our priority.  As part of our continuing mission to provide you with exceptional heart care, we have created designated Provider Care Teams.  These Care Teams include your primary Cardiologist (physician) and Advanced Practice Providers (APPs -  Physician Assistants and Nurse Practitioners) who all work together to provide you with the care you need, when you need it.  Your next appointment:   6 month(s)  The format for your next appointment:   In Person  Provider:   Skeet Latch, MD

## 2019-04-17 ENCOUNTER — Ambulatory Visit (INDEPENDENT_AMBULATORY_CARE_PROVIDER_SITE_OTHER): Admitting: Gastroenterology

## 2019-04-17 ENCOUNTER — Other Ambulatory Visit: Payer: Self-pay | Admitting: Gastroenterology

## 2019-04-17 ENCOUNTER — Encounter: Payer: Self-pay | Admitting: Gastroenterology

## 2019-04-17 VITALS — BP 120/76 | HR 76 | Temp 98.1°F | Ht 62.0 in | Wt 146.1 lb

## 2019-04-17 DIAGNOSIS — K219 Gastro-esophageal reflux disease without esophagitis: Secondary | ICD-10-CM

## 2019-04-17 DIAGNOSIS — Z1159 Encounter for screening for other viral diseases: Secondary | ICD-10-CM | POA: Insufficient documentation

## 2019-04-17 DIAGNOSIS — R142 Eructation: Secondary | ICD-10-CM | POA: Insufficient documentation

## 2019-04-17 DIAGNOSIS — R0789 Other chest pain: Secondary | ICD-10-CM

## 2019-04-17 MED ORDER — OMEPRAZOLE 40 MG PO CPDR: 40.0000 mg | DELAYED_RELEASE_CAPSULE | Freq: Every day | ORAL | 2 refills | Status: DC

## 2019-04-17 NOTE — Progress Notes (Addendum)
04/17/2019 Kimberly Stein [id number] July 19, 1955   HISTORY OF PRESENT ILLNESS: This is a pleasant 63 year old female who is a patient of Dr. Lynne Leader.  She presents here today at the request of cardiology for evaluation regarding belching, atypical chest discomfort, and actually what she describes as fluttering in her chest.  She had a visit with cardiology yesterday and they do not think this is cardiac related and they think that the fluttering is likely benign.  They suggested GI evaluation with EGD to rule out GI source.  She is taking over-the-counter omeprazole 40 mg daily.  Says she takes 2 of them at one time, but she wakes up very early in the morning and takes it and then does not eat breakfast.  She denies any overt sensation of heartburn or reflux.  Denies any dysphagia.  Describes a lot of belching.  Seems anxious.    Her last EGD was in 2012 at which time it was normal except for scalloping in her duodenum due to celiac disease.  Since that time she has been on a completely gluten-free diet.   Past Medical History:  Diagnosis Date  . Allergy    sinus infections  . Anemia   . Arthritis   . Celiac disease   . Fibromyalgia   . GERD (gastroesophageal reflux disease)   . Heart murmur   . Hiatal hernia   . Hyperlipidemia   . Hypertension   . Meniere's disease   . Neuromuscular disorder (HCC)    fibromyalgia  . Thyroid disease    hypothyroid  . 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 CARDIAC CATHETERIZATION N/A 02/25/2015   Procedure: Left Heart Cath and Coronary Angiography;  Surgeon: Peter M Martinique, MD;  Location: [place] [facility];  Service: Cardiovascular;  Laterality: N/A;  . CERVICAL DISC SURGERY    . CHOLECYSTECTOMY    . COLONOSCOPY    . DILATION AND CURETTAGE OF UTERUS    . HAND SURGERY Right    right hand; calcium deposit on wrist and under thumb and ganglion cyst and carpal tunnel  . lumbar  radiofrequency nerve ablation     twice yearly  . MOUTH SURGERY  2016   couple times due to gum disease  . ROTATOR CUFF REPAIR Left 06/07/2015   left shoulder  . TOTAL ABDOMINAL HYSTERECTOMY    . UPPER GASTROINTESTINAL ENDOSCOPY      reports that she has been smoking cigarettes. She has a 46.00 pack-year smoking history. She has never used smokeless tobacco. She reports that she does not drink alcohol or use drugs. family history includes Breast cancer in her maternal aunt; Celiac disease in her mother and sister; Diabetes in her mother, sister, and sister; Heart disease in her mother; Meniere's disease in her sister; Prostate cancer in her father. Allergies  Allergen Reactions  . Crestor [Rosuvastatin] Other (See Comments)    Bilateral Leg pain  . Lipitor [Atorvastatin Calcium]     Muscle aches  . Nortriptyline Rash  . Nortriptyline Hcl Rash    Red rash  . Vicodin [Hydrocodone-Acetaminophen] Itching      Outpatient Encounter Medications as of 04/17/2019  Medication Sig  . albuterol (VENTOLIN HFA) 108 (90 Base) MCG/ACT inhaler INHALE 2 PUFFS INTO THE LUNGS TWICE DAILY  . Calcium Carb-Cholecalciferol (CALCIUM 600 + D PO) Take by mouth 2 (two) times daily.  . chlorthalidone (HYGROTON) 25 MG tablet Take 1 tablet (25 mg total)  by mouth daily.  . cyclobenzaprine (FLEXERIL) 10 MG tablet TAKE 1 TABLET BY MOUTH EVERY NIGHT AT BEDTIME  . estradiol (ESTRACE) 1 MG tablet TAKE 1 TABLET BY MOUTH EVERY DAY  . fenofibrate (TRICOR) 145 MG tablet TAKE 1 TABLET(145 MG) BY MOUTH DAILY  . KLOR-CON M10 10 MEQ tablet TAKE 1 TABLET BY MOUTH DAILY  . levothyroxine (SYNTHROID, LEVOTHROID) 150 MCG tablet Take 1 tablet (150 mcg total) by mouth daily before breakfast.  . losartan (COZAAR) 50 MG tablet TAKE 1 TABLET(50 MG) BY MOUTH DAILY  . omeprazole (PRILOSEC) 20 MG capsule TAKE 1 CAPSULE BY MOUTH TWICE DAILY BEFORE A MEAL  . PSYLLIUM HUSK PO Take by mouth. Take 3 pills daily   Facility-Administered  Encounter Medications as of 04/17/2019  Medication  . 0.9 %  sodium chloride infusion     REVIEW OF SYSTEMS  : All other systems reviewed and negative except where noted in the History of Present Illness.   PHYSICAL EXAM: BP 120/76 (BP Location: Left Arm, Patient Position: Sitting, Cuff Size: Normal)   Pulse 76   Temp 98.1 F (36.7 C)   Ht 5' 2"  (1.575 m) Comment: height measured without shoes  Wt 146 lb 2 oz (66.3 kg)   BMI 26.73 kg/m  General: Well developed white female in no acute distress Head: Normocephalic and atraumatic Eyes:  Sclerae anicteric, conjunctiva pink. Ears: Normal auditory acuity Lungs: Clear throughout to auscultation; no increased WOB. Heart: Regular rate and rhythm; no M/R/G. Abdomen: Soft, non-distended.  BS present.  Non-tender. Musculoskeletal: Symmetrical with no gross deformities  Skin: No lesions on visible extremities Extremities: No edema  Neurological: Alert oriented x 4, grossly non-focal. Psychological:  Alert and cooperative. Normal mood and affect  ASSESSMENT AND PLAN: *Atypical chest pain, GERD, belching:  Also complaining of "fluttering" in her chest.  Cardio does not think cardiac issue and fluttering is likely benign.  I honestly think that this may be anxiety.  She is not taking her PPI correctly so will have her begin taking it 30-60 minutes before dinner in the evening--40 mg daily (takes two OTC at one time, does not eat breakfast in the morning).  Will schedule for EGD with Dr. Fuller Plan as well.  **The risks, benefits, and alternatives to EGD were discussed with the patient and she consents to proceed.   CC:  Eulas Post, MD

## 2019-04-17 NOTE — Progress Notes (Signed)
Reviewed and agree with management plan.  Nimsi Males T. Amour Cutrone, MD FACG [facility]  

## 2019-04-17 NOTE — Patient Instructions (Signed)
We have sent the following medications to your pharmacy for you to pick up at your convenience: Omeprazole   You have been scheduled for an endoscopy. Please follow written instructions given to you at your visit today. If you use inhalers (even only as needed), please bring them with you on the day of your procedure.    If you are age 63 or younger, your body mass index should be between 19-25. Your Body mass index is 26.73 kg/m. If this is out of the aformentioned range listed, please consider follow up with your Primary Care Provider.   Thank you for choosing me and [facility].  Alonza Bogus -PA

## 2019-04-26 ENCOUNTER — Other Ambulatory Visit: Payer: Self-pay | Admitting: Gastroenterology

## 2019-04-26 LAB — SARS CORONAVIRUS 2 (TAT 6-24 HRS): SARS Coronavirus 2: NEGATIVE

## 2019-04-29 ENCOUNTER — Telehealth: Payer: Self-pay | Admitting: Gastroenterology

## 2019-04-29 ENCOUNTER — Ambulatory Visit (INDEPENDENT_AMBULATORY_CARE_PROVIDER_SITE_OTHER)

## 2019-04-29 DIAGNOSIS — Z1159 Encounter for screening for other viral diseases: Secondary | ICD-10-CM

## 2019-04-29 NOTE — Telephone Encounter (Signed)
Looks like she was taking 2 OTC omeprazole 34m tablets so Kimberly Bogus PA-C sent in omeprazole 475mso she would only have to take one daily. I have left her a detailed message on her cell # and told her to call me back if this is not correct.

## 2019-04-30 ENCOUNTER — Ambulatory Visit (AMBULATORY_SURGERY_CENTER): Admitting: Gastroenterology

## 2019-04-30 ENCOUNTER — Other Ambulatory Visit: Payer: Self-pay

## 2019-04-30 ENCOUNTER — Encounter: Payer: Self-pay | Admitting: Gastroenterology

## 2019-04-30 VITALS — BP 123/48 | HR 56 | Temp 98.0°F | Resp 11 | Ht 63.0 in | Wt 146.1 lb

## 2019-04-30 DIAGNOSIS — K3189 Other diseases of stomach and duodenum: Secondary | ICD-10-CM

## 2019-04-30 DIAGNOSIS — K900 Celiac disease: Secondary | ICD-10-CM | POA: Diagnosis present

## 2019-04-30 DIAGNOSIS — R0789 Other chest pain: Secondary | ICD-10-CM | POA: Diagnosis not present

## 2019-04-30 MED ORDER — SODIUM CHLORIDE 0.9 % IV SOLN: 500.0000 mL | Freq: Once | INTRAVENOUS | Status: DC

## 2019-04-30 NOTE — Progress Notes (Signed)
VS- CS Temp-JB

## 2019-04-30 NOTE — Progress Notes (Signed)
PT taken to PACU. Monitors in place. VSS. Report given to RN. 

## 2019-04-30 NOTE — Op Note (Signed)
[facility] [facility] Patient Name: Kimberly Stein Procedure Date: 04/30/2019 8:55 AM MRN: [id number] Endoscopist: Ladene Artist , MD Age: 63 Referring MD:  Date of Birth: March 10, 1956 Gender: Female Account #: [id number] Procedure:                Upper GI endoscopy Indications:              Follow-up of celiac disease, Unexplained chest pain Medicines:                Monitored Anesthesia Care Procedure:                Pre-Anesthesia Assessment:                           - Prior to the procedure, a History and Physical                            was performed, and patient medications and                            allergies were reviewed. The patient's tolerance of                            previous anesthesia was also reviewed. The risks                            and benefits of the procedure and the sedation                            options and risks were discussed with the patient.                            All questions were answered, and informed consent                            was obtained. Prior Anticoagulants: The patient has                            taken no previous anticoagulant or antiplatelet                            agents. ASA Grade Assessment: II - A patient with                            mild systemic disease. After reviewing the risks                            and benefits, the patient was deemed in                            satisfactory condition to undergo the procedure.                           After obtaining informed consent, the endoscope was  passed under direct vision. Throughout the                            procedure, the patient's blood pressure, pulse, and                            oxygen saturations were monitored continuously. The                            Endoscope was introduced through the mouth, and                            advanced to the second part of duodenum. The upper                            GI  endoscopy was accomplished without difficulty.                            The patient tolerated the procedure well. Scope In: Scope Out: Findings:                 The esophagus was normal.                           The stomach was normal.                           The examined duodenum was normal. Biopsies for                            histology were taken with a cold forceps for                            evaluation of celiac disease.                           The cardia and gastric fundus were normal on                            retroflexion. Complications:            No immediate complications. Estimated Blood Loss:     Estimated blood loss: none. Impression:               - Normal esophagus.                           - Normal stomach.                           - Normal examined duodenum. Biopsied. Recommendation:           - Patient has a contact number available for                            emergencies. The signs and symptoms of potential  delayed complications were discussed with the                            patient. Return to normal activities tomorrow.                            Written discharge instructions were provided to the                            patient.                           - Resume previous diet.                           - Continue present medications.                           - Await pathology results.                           - Multiple PACs observed on telemetry, patient                            complained of fluttering sensation in chest Ladene Artist, MD 04/30/2019 9:11:07 AM This report has been signed electronically.

## 2019-04-30 NOTE — Progress Notes (Signed)
Called to room to assist during endoscopic procedure.  Patient ID and intended procedure confirmed with present staff. Received instructions for my participation in the procedure from the performing physician.  

## 2019-04-30 NOTE — Patient Instructions (Signed)
Thank you for allowing [place] to care for you today.  Await pathology results for celiac follow-up  Resume previous diet and medications today.  Return to your normal activities tomorrow.  Follow up with your primary care MD/ Cardiologist as needed for premature heart beats.       YOU HAD AN ENDOSCOPIC PROCEDURE TODAY AT [facility]:   Refer to the procedure report that was given to you for any specific questions about what was found during the examination.  If the procedure report does not answer your questions, please call your gastroenterologist to clarify.  If you requested that your care partner not be given the details of your procedure findings, then the procedure report has been included in a sealed envelope for you to review at your convenience later.  YOU SHOULD EXPECT: Some feelings of bloating in the abdomen. Passage of more gas than usual.  Walking can help get rid of the air that was put into your GI tract during the procedure and reduce the bloating. If you had a lower endoscopy (such as a colonoscopy or flexible sigmoidoscopy) you may notice spotting of blood in your stool or on the toilet paper. If you underwent a bowel prep for your procedure, you may not have a normal bowel movement for a few days.  Please Note:  You might notice some irritation and congestion in your nose or some drainage.  This is from the oxygen used during your procedure.  There is no need for concern and it should clear up in a day or so.  SYMPTOMS TO REPORT IMMEDIATELY:     Following upper endoscopy (EGD)  Vomiting of blood or coffee ground material  New chest pain or pain under the shoulder blades  Painful or persistently difficult swallowing  New shortness of breath  Fever of 100F or higher  Black, tarry-looking stools  For urgent or emergent issues, a gastroenterologist can be reached at any hour by calling [phone].   DIET:  We do recommend a small meal at first,  but then you may proceed to your regular diet.  Drink plenty of fluids but you should avoid alcoholic beverages for 24 hours.  ACTIVITY:  You should plan to take it easy for the rest of today and you should NOT DRIVE or use heavy machinery until tomorrow (because of the sedation medicines used during the test).    FOLLOW UP: Our staff will call the number listed on your records 48-72 hours following your procedure to check on you and address any questions or concerns that you may have regarding the information given to you following your procedure. If we do not reach you, we will leave a message.  We will attempt to reach you two times.  During this call, we will ask if you have developed any symptoms of COVID 19. If you develop any symptoms (ie: fever, flu-like symptoms, shortness of breath, cough etc.) before then, please call [phone].  If you test positive for Covid 19 in the 2 weeks post procedure, please call and report this information to [place].    If any biopsies were taken you will be contacted by phone or by letter within the next 1-3 weeks.  Please call us at [phone] if you have not heard about the biopsies in 3 weeks.    SIGNATURES/CONFIDENTIALITY: You and/or your care partner have signed paperwork which will be entered into your electronic medical record.  These signatures attest to the fact that  that the information above on your After Visit Summary has been reviewed and is understood.  Full responsibility of the confidentiality of this discharge information lies with you and/or your care-partner.

## 2019-05-02 ENCOUNTER — Telehealth: Payer: Self-pay

## 2019-05-02 NOTE — Telephone Encounter (Signed)
  Follow up Call-  Call back number 04/30/2019 09/13/2017  Post procedure Call Back phone  # [phone]  Permission to leave phone message Yes Yes  Some recent data might be hidden     Patient questions:  Do you have a fever, pain , or abdominal swelling? No. Pain Score  0 *  Have you tolerated food without any problems? Yes.    Have you been able to return to your normal activities? Yes.    Do you have any questions about your discharge instructions: Diet   No. Medications  No. Follow up visit  No.  Do you have questions or concerns about your Care? No.  Actions: * If pain score is 4 or above: No action needed, pain <4. 1. Have you developed a fever since your procedure? no  2.   Have you had an respiratory symptoms (SOB or cough) since your procedure? no  3.   Have you tested positive for COVID 19 since your procedure *no 4.   Have you had any family members/close contacts diagnosed with the COVID 19 since your procedure?  no   If yes to any of these questions please route to Joylene John, RN and Alphonsa Gin, Therapist, sports.

## 2019-05-08 ENCOUNTER — Other Ambulatory Visit: Payer: Self-pay

## 2019-05-08 DIAGNOSIS — K900 Celiac disease: Secondary | ICD-10-CM

## 2019-06-12 ENCOUNTER — Other Ambulatory Visit (INDEPENDENT_AMBULATORY_CARE_PROVIDER_SITE_OTHER)

## 2019-06-12 DIAGNOSIS — K900 Celiac disease: Secondary | ICD-10-CM | POA: Diagnosis not present

## 2019-06-12 LAB — IGA: IgA: 151 mg/dL (ref 68–378)

## 2019-06-13 LAB — TISSUE TRANSGLUTAMINASE, IGA: (tTG) Ab, IgA: 1 U/mL

## 2019-07-10 ENCOUNTER — Other Ambulatory Visit: Payer: Self-pay | Admitting: Rheumatology

## 2019-07-10 NOTE — Telephone Encounter (Signed)
Last Visit: 02/11/2019 telemedicine  Next Visit: 08/01/19  Okay to refill per Dr. Estanislado Pandy

## 2019-07-23 ENCOUNTER — Ambulatory Visit: Admitting: Family Medicine

## 2019-07-25 NOTE — Progress Notes (Deleted)
Office Visit Note  Patient: Kimberly Stein             Date of Birth: 09/26/1955           MRN: [id number]             PCP: Eulas Post, MD Referring: Eulas Post, MD Visit Date: 08/01/2019 Occupation: @GUAROCC @  Subjective:  No chief complaint on file.   History of Present Illness: Kimberly Stein is a 64 y.o. female ***   Activities of Daily Living:  Patient reports morning stiffness for *** {minute/hour:19697}.   Patient {ACTIONS;DENIES/REPORTS:21021675::"Denies"} nocturnal pain.  Difficulty dressing/grooming: {ACTIONS;DENIES/REPORTS:21021675::"Denies"} Difficulty climbing stairs: {ACTIONS;DENIES/REPORTS:21021675::"Denies"} Difficulty getting out of chair: {ACTIONS;DENIES/REPORTS:21021675::"Denies"} Difficulty using hands for taps, buttons, cutlery, and/or writing: {ACTIONS;DENIES/REPORTS:21021675::"Denies"}  No Rheumatology ROS completed.   PMFS History:  Patient Active Problem List   Diagnosis Date Noted  . Belching 04/17/2019  . Screening for viral disease 04/17/2019  . Other fatigue 06/13/2016  . Primary insomnia 06/13/2016  . Primary osteoarthritis of both hands 06/13/2016  . Trochanteric bursitis of both hips 06/13/2016  . DDD cervical spine status post fusion 06/13/2016  . Osteoarthritis of lumbar spine 06/13/2016  . Tendinopathy of right rotator cuff 06/13/2016  . Chest pain, atypical 02/25/2015  . Abnormal nuclear stress test 02/25/2015  . Hyperlipidemia 07/17/2013  . Osteopenia 01/28/2012  . 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 CELIAC SPRUE 09/29/2009  . Hypothyroidism 09/07/2009  . Meniere's disease 09/07/2009  . Essential hypertension 09/07/2009  . RUQ PAIN 09/07/2009  . Fibromyalgia 06/12/2009  . CERVICALGIA 06/03/2009  . ANEMIA-IRON DEFICIENCY 10/21/2008  . Gastroesophageal reflux disease 10/21/2008  . DIZZINESS 10/21/2008  . COLONIC POLYPS, HX OF 10/21/2008    Past Medical History:  Diagnosis Date  . Allergy    sinus infections    . Anemia   . Arthritis   . Celiac disease   . Fibromyalgia   . GERD (gastroesophageal reflux disease)   . Heart murmur   . Hiatal hernia   . Hyperlipidemia   . Hypertension   . Meniere's disease   . Neuromuscular disorder (HCC)    fibromyalgia  . Thyroid disease    hypothyroid  . 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 Celiac disease Mother   . Diabetes Mother   . Heart disease Mother   . Prostate cancer Father   . Breast cancer Maternal Aunt   . Diabetes Sister   . Meniere's disease Sister   . Celiac disease Sister   . Diabetes Sister   . Colon cancer Neg Hx   . Esophageal cancer Neg Hx   . Stomach cancer Neg Hx   . Rectal cancer Neg Hx   . Liver cancer Neg Hx   . Pancreatic cancer Neg Hx    Past Surgical History:  Procedure Laterality Date  . CARDIAC CATHETERIZATION N/A 02/25/2015   Procedure: Left Heart Cath and Coronary Angiography;  Surgeon: Peter M Martinique, MD;  Location: [facility];  Service: Cardiovascular;  Laterality: N/A;  . CERVICAL DISC SURGERY    . CHOLECYSTECTOMY    . COLONOSCOPY    . DILATION AND CURETTAGE OF UTERUS    . HAND SURGERY Right    right hand; calcium deposit on wrist and under thumb and ganglion cyst and carpal tunnel  . lumbar radiofrequency nerve ablation     twice yearly  . MOUTH SURGERY  2016   couple times due  to gum disease  . ROTATOR CUFF REPAIR Left 06/07/2015   left shoulder  . TOTAL ABDOMINAL HYSTERECTOMY    . UPPER GASTROINTESTINAL ENDOSCOPY     Social History   Social History Narrative  . Not on file   Immunization History  Administered Date(s) Administered  . Pneumococcal Conjugate-13 07/17/2013  . Tdap 06/26/2013  . Zoster 07/17/2013     Objective: Vital Signs: There were no vitals taken for this visit.   Physical Exam   Musculoskeletal Exam: ***  CDAI Exam: CDAI Score: -- Patient Global: --; Provider Global: -- Swollen: --; Tender: -- Joint  Exam 08/01/2019   No joint exam has been documented for this visit   There is currently no information documented on the homunculus. Go to the Rheumatology activity and complete the homunculus joint exam.  Investigation: No additional findings.  Imaging: No results found.  Recent Labs: Lab Results  Component Value Date   WBC 6.9 08/08/2018   HGB 13.6 08/08/2018   PLT 190.0 08/08/2018   NA 139 08/08/2018   K 4.0 08/08/2018   CL 100 08/08/2018   CO2 30 08/08/2018   GLUCOSE 98 08/08/2018   BUN 18 08/08/2018   CREATININE 0.74 08/08/2018   BILITOT 0.5 10/09/2018   ALKPHOS 28 (L) 10/09/2018   AST 25 10/09/2018   ALT 20 10/09/2018   PROT 7.5 10/09/2018   ALBUMIN 4.7 10/09/2018   CALCIUM 10.4 08/08/2018   GFRAA 85 12/12/2016    Speciality Comments: No specialty comments available.  Procedures:  No procedures performed Allergies: Crestor [rosuvastatin], Lipitor [atorvastatin calcium], Nortriptyline, Nortriptyline hcl, and Vicodin [hydrocodone-acetaminophen]   Assessment / Plan:     Visit Diagnoses: No diagnosis found.  Orders: No orders of the defined types were placed in this encounter.  No orders of the defined types were placed in this encounter.   Face-to-face time spent with patient was *** minutes. Greater than 50% of time was spent in counseling and coordination of care.  Follow-Up Instructions: No follow-ups on file.   Earnestine Mealing, CMA  Note - This record has been created using Editor, commissioning.  Chart creation errors have been sought, but may not always  have been located. Such creation errors do not reflect on  the standard of medical care.

## 2019-08-01 ENCOUNTER — Ambulatory Visit: Admitting: Rheumatology

## 2019-08-04 ENCOUNTER — Other Ambulatory Visit: Payer: Self-pay | Admitting: Family Medicine

## 2019-08-13 ENCOUNTER — Other Ambulatory Visit: Payer: Self-pay | Admitting: Family Medicine

## 2019-08-19 ENCOUNTER — Other Ambulatory Visit: Payer: Self-pay | Admitting: Family Medicine

## 2019-08-19 DIAGNOSIS — Z1231 Encounter for screening mammogram for malignant neoplasm of breast: Secondary | ICD-10-CM

## 2019-08-20 ENCOUNTER — Other Ambulatory Visit: Payer: Self-pay

## 2019-08-21 ENCOUNTER — Ambulatory Visit (INDEPENDENT_AMBULATORY_CARE_PROVIDER_SITE_OTHER): Admitting: Family Medicine

## 2019-08-21 ENCOUNTER — Encounter: Payer: Self-pay | Admitting: Family Medicine

## 2019-08-21 VITALS — BP 122/76 | HR 65 | Temp 98.7°F | Ht 61.5 in | Wt 151.9 lb

## 2019-08-21 DIAGNOSIS — Z0000 Encounter for general adult medical examination without abnormal findings: Secondary | ICD-10-CM | POA: Diagnosis not present

## 2019-08-21 LAB — CBC WITH DIFFERENTIAL/PLATELET
Basophils Absolute: 0 10*3/uL (ref 0.0–0.1)
Basophils Relative: 0.6 % (ref 0.0–3.0)
Eosinophils Absolute: 0.1 10*3/uL (ref 0.0–0.7)
Eosinophils Relative: 0.8 % (ref 0.0–5.0)
HCT: 38.9 % (ref 36.0–46.0)
Hemoglobin: 13.4 g/dL (ref 12.0–15.0)
Lymphocytes Relative: 46.2 % — ABNORMAL HIGH (ref 12.0–46.0)
Lymphs Abs: 3.7 10*3/uL (ref 0.7–4.0)
MCHC: 34.4 g/dL (ref 30.0–36.0)
MCV: 95.7 fl (ref 78.0–100.0)
Monocytes Absolute: 0.4 10*3/uL (ref 0.1–1.0)
Monocytes Relative: 4.7 % (ref 3.0–12.0)
Neutro Abs: 3.8 10*3/uL (ref 1.4–7.7)
Neutrophils Relative %: 47.7 % (ref 43.0–77.0)
Platelets: 199 10*3/uL (ref 150.0–400.0)
RBC: 4.07 Mil/uL (ref 3.87–5.11)
RDW: 13.7 % (ref 11.5–15.5)
WBC: 8 10*3/uL (ref 4.0–10.5)

## 2019-08-21 LAB — BASIC METABOLIC PANEL
BUN: 17 mg/dL (ref 6–23)
CO2: 32 mEq/L (ref 19–32)
Calcium: 9.8 mg/dL (ref 8.4–10.5)
Chloride: 100 mEq/L (ref 96–112)
Creatinine, Ser: 0.77 mg/dL (ref 0.40–1.20)
GFR: 75.55 mL/min (ref >60.00)
Glucose, Bld: 87 mg/dL (ref 70–99)
Potassium: 4.2 mEq/L (ref 3.5–5.1)
Sodium: 140 mEq/L (ref 135–145)

## 2019-08-21 LAB — LIPID PANEL
Cholesterol: 237 mg/dL — ABNORMAL HIGH (ref 0–200)
HDL: 62.3 mg/dL (ref >39.00)
LDL Cholesterol: 146 mg/dL — ABNORMAL HIGH (ref 0–99)
NonHDL: 175.1
Total CHOL/HDL Ratio: 4
Triglycerides: 145 mg/dL (ref 0.0–149.0)
VLDL: 29 mg/dL (ref 0.0–40.0)

## 2019-08-21 LAB — HEPATIC FUNCTION PANEL
ALT: 14 U/L (ref 0–35)
AST: 16 U/L (ref 0–37)
Albumin: 4.5 g/dL (ref 3.5–5.2)
Alkaline Phosphatase: 26 U/L — ABNORMAL LOW (ref 39–117)
Bilirubin, Direct: 0.1 mg/dL (ref 0.0–0.3)
Total Bilirubin: 0.4 mg/dL (ref 0.2–1.2)
Total Protein: 7.1 g/dL (ref 6.0–8.3)

## 2019-08-21 LAB — TSH: TSH: 1.52 u[IU]/mL (ref 0.35–4.50)

## 2019-08-21 MED ORDER — LOSARTAN POTASSIUM 50 MG PO TABS: ORAL_TABLET | ORAL | 3 refills | Status: DC

## 2019-08-21 NOTE — Patient Instructions (Signed)

## 2019-08-21 NOTE — Progress Notes (Signed)
Subjective:     Patient ID: Kimberly Stein, female   DOB: 07-02-1955, 64 y.o.   MRN: [id number]  HPI   Kimberly Stein is seen for physical exam.  She has several chronic problems including history of hypertension, celiac disease, hypothyroidism, Mnire's disease, degenerative arthritis involving multiple joints.  She is followed by rheumatology.  She also has fibromyalgia and has ongoing muscle aches related to that.  She generally does fairly well with regard to her celiac disease with gluten avoidance    She continues to see a rheumatologist regularly.  She had hysterectomy years ago and does not get any Pap smears.  We looked at the following health maintenance issues for review  -She declines flu vaccinations -Previous hepatitis C screen 2019 - -tetanus due 2025.  Colonoscopy due April 2022. -Repeat mammogram has been scheduled for next month -She was scheduled for low-dose CT lung cancer screening last year but this was right before the pandemic and she never followed through with that.  She would like to still consider this. -She had previous Zostavax but declines Shingrix at this time  Past Medical History:  Diagnosis Date  . Allergy    sinus infections  . Anemia   . Arthritis   . Celiac disease   . Fibromyalgia   . GERD (gastroesophageal reflux disease)   . Heart murmur   . Hiatal hernia   . Hyperlipidemia   . Hypertension   . Meniere's disease   . Neuromuscular disorder (HCC)    fibromyalgia  . Thyroid disease    hypothyroid  . 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 CARDIAC CATHETERIZATION N/A 02/25/2015   Procedure: Left Heart Cath and Coronary Angiography;  Surgeon: Peter M Martinique, MD;  Location: [facility];  Service: Cardiovascular;  Laterality: N/A;  . CERVICAL DISC SURGERY    . CHOLECYSTECTOMY    . COLONOSCOPY    . DILATION AND CURETTAGE OF UTERUS    . HAND SURGERY Right    right hand; calcium deposit  on wrist and under thumb and ganglion cyst and carpal tunnel  . lumbar radiofrequency nerve ablation     twice yearly  . MOUTH SURGERY  2016   couple times due to gum disease  . ROTATOR CUFF REPAIR Left 06/07/2015   left shoulder  . TOTAL ABDOMINAL HYSTERECTOMY    . UPPER GASTROINTESTINAL ENDOSCOPY      reports that she has been smoking cigarettes. She has a 46.00 pack-year smoking history. She has never used smokeless tobacco. She reports that she does not drink alcohol or use drugs. family history includes Breast cancer in her maternal aunt; Celiac disease in her mother and sister; Diabetes in her mother, sister, and sister; Heart disease in her mother; Meniere's disease in her sister; Prostate cancer in her father. Allergies  Allergen Reactions  . Crestor [Rosuvastatin] Other (See Comments)    Bilateral Leg pain  . Lipitor [Atorvastatin Calcium]     Muscle aches  . Nortriptyline Rash  . Nortriptyline Hcl Rash    Red rash  . Vicodin [Hydrocodone-Acetaminophen] Itching       Review of Systems  Constitutional: Negative for activity change, appetite change, fatigue, fever and unexpected weight change.  HENT: Negative for ear pain, hearing loss, sore throat and trouble swallowing.   Eyes: Negative for visual disturbance.  Respiratory: Negative for cough and shortness of breath.   Cardiovascular: Negative for chest pain and palpitations.  Gastrointestinal:  Negative for abdominal pain, blood in stool, constipation and diarrhea.  Endocrine: Negative for polydipsia and polyuria.  Genitourinary: Negative for dysuria and hematuria.  Musculoskeletal: Positive for back pain. Negative for arthralgias and myalgias.  Skin: Negative for rash.  Neurological: Negative for dizziness, syncope and headaches.  Hematological: Negative for adenopathy.  Psychiatric/Behavioral: Negative for confusion and dysphoric mood.       Objective:   Physical Exam Constitutional:      Appearance: She is  well-developed.  HENT:     Head: Normocephalic and atraumatic.  Eyes:     Pupils: Pupils are equal, round, and reactive to light.  Neck:     Thyroid: No thyromegaly.  Cardiovascular:     Rate and Rhythm: Normal rate and regular rhythm.     Heart sounds: Normal heart sounds. No murmur.  Pulmonary:     Effort: No respiratory distress.     Breath sounds: Normal breath sounds. No wheezing or rales.  Abdominal:     General: Bowel sounds are normal. There is no distension.     Palpations: Abdomen is soft. There is no mass.     Tenderness: There is no abdominal tenderness. There is no guarding or rebound.  Musculoskeletal:        General: Normal range of motion.     Cervical back: Normal range of motion and neck supple.     Right lower leg: No edema.     Left lower leg: No edema.  Lymphadenopathy:     Cervical: No cervical adenopathy.  Skin:    Findings: No rash.  Neurological:     Mental Status: She is alert and oriented to person, place, and time.     Cranial Nerves: No cranial nerve deficit.     Deep Tendon Reflexes: Reflexes normal.  Psychiatric:        Behavior: Behavior normal.        Thought Content: Thought content normal.        Judgment: Judgment normal.        Assessment:     Physical exam.  Seline has chronic problems as above which appear to be stable.  She is still on oral estrogen without clear ongoing benefit.  Her previous DEXA scan revealed good bone density with no osteopenia    Plan:     -We recommend she discontinue estrogen particularly in view of her increasing age and ongoing nicotine use which places her at high risk for clotting issues  -Obtain follow-up screening labs  -Discussed Shingrix vaccine and she declines  -Referral back to low-dose CT lung cancer screening program which she does have interest in  Eulas Post MD [facility]

## 2019-08-22 ENCOUNTER — Other Ambulatory Visit: Payer: Self-pay

## 2019-08-22 DIAGNOSIS — E785 Hyperlipidemia, unspecified: Secondary | ICD-10-CM

## 2019-08-22 MED ORDER — EZETIMIBE 10 MG PO TABS: 10.0000 mg | ORAL_TABLET | Freq: Every day | ORAL | 2 refills | Status: DC

## 2019-08-22 NOTE — Progress Notes (Signed)
Office Visit Note  Patient: Kimberly Stein             Date of Birth: 19-Feb-1956           MRN: [id number]             PCP: Eulas Post, MD Referring: Eulas Post, MD Visit Date: 08/27/2019 Occupation: @GUAROCC @  Subjective:  Fatigue   History of Present Illness: Kimberly Stein is a 64 y.o. female with history of fibromyalgia, DDD, and osteoarthritis.  She denies any joint pain or joint swelling at this time. She has intermittent flares of fibromyalgia but overall her discomfort has been tolerable. She has ongoing left trochanteric bursitis.  She takes flexeril 10 mg po at bedtime for muscle spasms and insomnia.  She has difficulty sleeping without flexeril. She has chronic fatigue which has been stable.    Activities of Daily Living:  Patient reports morning stiffness for 2 hours.   Patient Reports nocturnal pain.  Difficulty dressing/grooming: Denies Difficulty climbing stairs: Denies Difficulty getting out of chair: Denies Difficulty using hands for taps, buttons, cutlery, and/or writing: Reports  Review of Systems  Constitutional: Positive for fatigue.  HENT: Positive for mouth dryness. Negative for mouth sores and nose dryness.   Eyes: Negative for pain, visual disturbance and dryness.  Respiratory: Negative for cough, hemoptysis, shortness of breath and difficulty breathing.   Cardiovascular: Negative for chest pain, palpitations, hypertension and swelling in legs/feet.  Gastrointestinal: Negative for blood in stool, constipation and diarrhea.  Endocrine: Negative for increased urination.  Genitourinary: Negative for difficulty urinating and painful urination.  Musculoskeletal: Positive for arthralgias, joint pain, muscle weakness, morning stiffness and muscle tenderness. Negative for joint swelling, myalgias and myalgias.  Skin: Negative for color change, pallor, rash, hair loss, nodules/bumps, skin tightness, ulcers and sensitivity to sunlight.    Allergic/Immunologic: Negative for susceptible to infections.  Neurological: Negative for dizziness, numbness and headaches.  Hematological: Negative for bruising/bleeding tendency and swollen glands.  Psychiatric/Behavioral: Positive for sleep disturbance. Negative for depressed mood. The patient is not nervous/anxious.     PMFS History:  Patient Active Problem List   Diagnosis Date Noted  . Belching 04/17/2019  . Screening for viral disease 04/17/2019  . Other fatigue 06/13/2016  . Primary insomnia 06/13/2016  . Primary osteoarthritis of both hands 06/13/2016  . Trochanteric bursitis of both hips 06/13/2016  . DDD cervical spine status post fusion 06/13/2016  . Osteoarthritis of lumbar spine 06/13/2016  . Tendinopathy of right rotator cuff 06/13/2016  . Chest pain, atypical 02/25/2015  . Abnormal nuclear stress test 02/25/2015  . Hyperlipidemia 07/17/2013  . Osteopenia 01/28/2012  . 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 CELIAC SPRUE 09/29/2009  . Hypothyroidism 09/07/2009  . Meniere's disease 09/07/2009  . Essential hypertension 09/07/2009  . RUQ PAIN 09/07/2009  . Fibromyalgia 06/12/2009  . CERVICALGIA 06/03/2009  . ANEMIA-IRON DEFICIENCY 10/21/2008  . Gastroesophageal reflux disease 10/21/2008  . DIZZINESS 10/21/2008  . COLONIC POLYPS, HX OF 10/21/2008    Past Medical History:  Diagnosis Date  . Allergy    sinus infections  . Anemia   . Arthritis   . Celiac disease   . Fibromyalgia   . GERD (gastroesophageal reflux disease)   . Heart murmur   . Hiatal hernia   . Hyperlipidemia   . Hypertension   . Meniere's disease   . Neuromuscular disorder (HCC)    fibromyalgia  . Thyroid disease    hypothyroid  . 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 Celiac disease Mother   . Diabetes Mother   . Heart disease Mother   . Prostate cancer Father   . Breast cancer Maternal Aunt   . Diabetes Sister   . Meniere's disease  Sister   . Celiac disease Sister   . Diabetes Sister   . Colon cancer Neg Hx   . Esophageal cancer Neg Hx   . Stomach cancer Neg Hx   . Rectal cancer Neg Hx   . Liver cancer Neg Hx   . Pancreatic cancer Neg Hx    Past Surgical History:  Procedure Laterality Date  . CARDIAC CATHETERIZATION N/A 02/25/2015   Procedure: Left Heart Cath and Coronary Angiography;  Surgeon: Peter M Martinique, MD;  Location: [facility];  Service: Cardiovascular;  Laterality: N/A;  . CERVICAL DISC SURGERY    . CHOLECYSTECTOMY    . COLONOSCOPY    . DILATION AND CURETTAGE OF UTERUS    . HAND SURGERY Right    right hand; calcium deposit on wrist and under thumb and ganglion cyst and carpal tunnel  . lumbar radiofrequency nerve ablation     twice yearly  . MOUTH SURGERY  2016   couple times due to gum disease  . ROTATOR CUFF REPAIR Left 06/07/2015   left shoulder  . TOTAL ABDOMINAL HYSTERECTOMY    . UPPER GASTROINTESTINAL ENDOSCOPY     Social History   Social History Narrative  . Not on file   Immunization History  Administered Date(s) Administered  . Pneumococcal Conjugate-13 07/17/2013  . Tdap 06/26/2013  . Zoster 07/17/2013     Objective: Vital Signs: BP 135/71 (BP Location: Left Arm, Patient Position: Sitting, Cuff Size: Normal)   Pulse 67   Resp 18   Ht 5' 1"  (1.549 m)   Wt 154 lb 12.8 oz (70.2 kg)   BMI 29.25 kg/m    Physical Exam Vitals and nursing note reviewed.  Constitutional:      Appearance: She is well-developed.  HENT:     Head: Normocephalic and atraumatic.  Eyes:     Conjunctiva/sclera: Conjunctivae normal.  Pulmonary:     Effort: Pulmonary effort is normal.     Breath sounds: Normal breath sounds.  Abdominal:     General: Bowel sounds are normal.     Palpations: Abdomen is soft.  Musculoskeletal:     Cervical back: Normal range of motion.  Lymphadenopathy:     Cervical: No cervical adenopathy.  Skin:    General: Skin is warm and dry.     Capillary Refill:  Capillary refill takes less than 2 seconds.  Neurological:     Mental Status: She is alert and oriented to person, place, and time.  Psychiatric:        Behavior: Behavior normal.      Musculoskeletal Exam: Fibromyalgia tender points noted. C-spine, thoracic spine, and lumbar spine good ROM.  Shoulder joints, elbow joints, wrist joints, MCPs, PIPs, and DIPs good ROM with no synovitis.  PIP, DIP, and CMC joint thickening consistent with osteoarthritis of both hands.  Hip joints, knee joints, ankle joints, MTPs, PIPs, and DIPs good ROM with no synovitis.  No warmth or effusion of knee joints.  No tenderness or swelling of ankle joints.  Tenderness over the left trochanteric bursa.   CDAI Exam: CDAI Score: -- Patient Global: --; Provider Global: -- Swollen: --; Tender: -- Joint Exam 08/27/2019   No joint exam  has been documented for this visit   There is currently no information documented on the homunculus. Go to the Rheumatology activity and complete the homunculus joint exam.  Investigation: No additional findings.  Imaging: No results found.  Recent Labs: Lab Results  Component Value Date   WBC 8.0 08/21/2019   HGB 13.4 08/21/2019   PLT 199.0 08/21/2019   NA 140 08/21/2019   K 4.2 08/21/2019   CL 100 08/21/2019   CO2 32 08/21/2019   GLUCOSE 87 08/21/2019   BUN 17 08/21/2019   CREATININE 0.77 08/21/2019   BILITOT 0.4 08/21/2019   ALKPHOS 26 (L) 08/21/2019   AST 16 08/21/2019   ALT 14 08/21/2019   PROT 7.1 08/21/2019   ALBUMIN 4.5 08/21/2019   CALCIUM 9.8 08/21/2019   GFRAA 85 12/12/2016    Speciality Comments: No specialty comments available.  Procedures:  No procedures performed Allergies: Crestor [rosuvastatin], Lipitor [atorvastatin calcium], Nsaids, Nortriptyline, Nortriptyline hcl, and Vicodin [hydrocodone-acetaminophen]   Assessment / Plan:     Visit Diagnoses: Fibromyalgia: She has intermittent generalized myalgias and muscle tenderness due to  fibromyalgia.  Overall her discomfort has been tolerable.  She takes Flexeril 10 mg by mouth at bedtime for muscle spasms and insomnia.  We discussed trying to reduce the dose of Flexeril to 5 mg by mouth at bedtime as needed.  She does not need a refill at this time.  She continues to have difficulty sleeping at night due to nocturnal pain.  We discussed the importance of regular exercise and good sleep hygiene.  She will follow-up in the office in 6 months.  Trochanteric bursitis of both hips: She has tenderness over the left trochanteric bursa on exam.  No tenderness to palpation over the right trochanteric bursa.  We discussed the importance of performing stretching exercises on a daily basis.  She was given a handout of exercises to perform.  Primary osteoarthritis of both hands: She has PIP, DIP, and CMC joint thickening consistent with osteoarthritis of both hands.  No tenderness or synovitis was noted.  She has complete fist formation bilaterally.  Joint protection and muscle strengthening were discussed.  Tendinopathy of right rotator cuff: She has good range of motion of the right shoulder joint on exam.  DDD (degenerative disc disease), cervical - s/p fusion: She has good range of motion on exam with no discomfort at this time.  She has no symptoms of radiculopathy.  DDD (degenerative disc disease), lumbar - She is followed by Dr. Maryjean Ka.  She had a nerve ablation performed on 11/23/18.  Her lower back pain has been tolerable recently.  Primary insomnia - She takes flexeril 10 mg po at bedtime.  She was encouraged to try to reduce the dose of Flexeril to 5 mg by mouth at bedtime as needed.  We discussed the importance of good sleep hygiene.  Other fatigue: She continues to have chronic fatigue which has been stable.  Discussed importance of regular exercise.  Osteopenia of multiple sites: DEXA on 07/19/2018 right femoral neck BMD 0.814 with T score -0.3.  Statistically significant increase  in all areas scanned.  Other medical conditions are listed as follows:   History of hyperlipidemia  History of Meniere's disease  History of hypertension  History of hypothyroidism  History of gastroesophageal reflux (GERD)  History of anemia  Orders: No orders of the defined types were placed in this encounter.  No orders of the defined types were placed in this encounter.     Follow-Up  Instructions: Return in about 6 months (around 02/26/2020) for Fibromyalgia, Osteoarthritis, DDD.   Ofilia Neas, PA-C   I examined and evaluated the patient with Hazel Sams PA.  Patient continues to have some generalized pain and discomfort.  She states the Flexeril helps her with insomnia and generalized pain.  Reducing the dose of Flexeril was emphasized.  Also using Flexeril on as needed basis was discussed.  The plan of care was discussed as noted above.  Bo Merino, MD  Note - This record has been created using Editor, commissioning.  Chart creation errors have been sought, but may not always  have been located. Such creation errors do not reflect on  the standard of medical care.

## 2019-08-26 ENCOUNTER — Telehealth: Payer: Self-pay | Admitting: Family Medicine

## 2019-08-26 MED ORDER — POTASSIUM CHLORIDE CRYS ER 10 MEQ PO TBCR: 10.0000 meq | EXTENDED_RELEASE_TABLET | Freq: Every day | ORAL | 3 refills | Status: DC

## 2019-08-26 MED ORDER — CHLORTHALIDONE 25 MG PO TABS: 25.0000 mg | ORAL_TABLET | Freq: Every day | ORAL | 3 refills | Status: DC

## 2019-08-26 NOTE — Telephone Encounter (Signed)
Refill has been sent in

## 2019-08-26 NOTE — Telephone Encounter (Signed)
Medication Refill: Chlorthalidone  Pharmacy: [facility]: [phone]   Pt states that the pharmacy has been faxing over a refill request for this but they have not received a request back. She states that [facility] gave her 4 pills of Chlorthalidone to get her through so she will need to fax extra. She also said that they gave her 3 pills of her Losartan before she was able to take her physical so she is going to be 3 pills short. Pt states she is upset b/c she told the CMA about needing her medication when she was here last week for her CPE and was told that she would fax all her mediation.

## 2019-08-27 ENCOUNTER — Encounter: Payer: Self-pay | Admitting: Rheumatology

## 2019-08-27 ENCOUNTER — Other Ambulatory Visit: Payer: Self-pay

## 2019-08-27 ENCOUNTER — Ambulatory Visit (INDEPENDENT_AMBULATORY_CARE_PROVIDER_SITE_OTHER): Admitting: Rheumatology

## 2019-08-27 VITALS — BP 135/71 | HR 67 | Resp 18 | Ht 61.0 in | Wt 154.8 lb

## 2019-08-27 DIAGNOSIS — Z8639 Personal history of other endocrine, nutritional and metabolic disease: Secondary | ICD-10-CM

## 2019-08-27 DIAGNOSIS — Z8719 Personal history of other diseases of the digestive system: Secondary | ICD-10-CM

## 2019-08-27 DIAGNOSIS — Z862 Personal history of diseases of the blood and blood-forming organs and certain disorders involving the immune mechanism: Secondary | ICD-10-CM

## 2019-08-27 DIAGNOSIS — Z8679 Personal history of other diseases of the circulatory system: Secondary | ICD-10-CM

## 2019-08-27 DIAGNOSIS — Z8669 Personal history of other diseases of the nervous system and sense organs: Secondary | ICD-10-CM

## 2019-08-27 DIAGNOSIS — F5101 Primary insomnia: Secondary | ICD-10-CM

## 2019-08-27 DIAGNOSIS — M5030 Other cervical disc degeneration, unspecified cervical region: Secondary | ICD-10-CM

## 2019-08-27 DIAGNOSIS — M19041 Primary osteoarthritis, right hand: Secondary | ICD-10-CM

## 2019-08-27 DIAGNOSIS — M51369 Other intervertebral disc degeneration, lumbar region without mention of lumbar back pain or lower extremity pain: Secondary | ICD-10-CM

## 2019-08-27 DIAGNOSIS — M797 Fibromyalgia: Secondary | ICD-10-CM

## 2019-08-27 DIAGNOSIS — M5136 Other intervertebral disc degeneration, lumbar region: Secondary | ICD-10-CM

## 2019-08-27 DIAGNOSIS — M7061 Trochanteric bursitis, right hip: Secondary | ICD-10-CM | POA: Diagnosis not present

## 2019-08-27 DIAGNOSIS — R5383 Other fatigue: Secondary | ICD-10-CM

## 2019-08-27 DIAGNOSIS — M8589 Other specified disorders of bone density and structure, multiple sites: Secondary | ICD-10-CM

## 2019-08-27 DIAGNOSIS — M7062 Trochanteric bursitis, left hip: Secondary | ICD-10-CM

## 2019-08-27 DIAGNOSIS — M67911 Unspecified disorder of synovium and tendon, right shoulder: Secondary | ICD-10-CM | POA: Diagnosis not present

## 2019-08-27 DIAGNOSIS — M19042 Primary osteoarthritis, left hand: Secondary | ICD-10-CM

## 2019-08-27 NOTE — Patient Instructions (Signed)
Hip Bursitis Rehab Ask your health care provider which exercises are safe for you. Do exercises exactly as told by your health care provider and adjust them as directed. It is normal to feel mild stretching, pulling, tightness, or discomfort as you do these exercises. Stop right away if you feel sudden pain or your pain gets worse. Do not begin these exercises until told by your health care provider. Stretching exercise This exercise warms up your muscles and joints and improves the movement and flexibility of your hip. This exercise also helps to relieve pain and stiffness. Iliotibial band stretch An iliotibial band is a strong band of muscle tissue that runs from the outer side of your hip to the outer side of your thigh and knee. 1. Lie on your side with your left / right leg in the top position. 2. Bend your left / right knee and grab your ankle. Stretch out your bottom arm to help you balance. 3. Slowly bring your knee back so your thigh is behind your body. 4. Slowly lower your knee toward the floor until you feel a gentle stretch on the outside of your left / right thigh. If you do not feel a stretch and your knee will not fall farther, place the heel of your other foot on top of your knee and pull your knee down toward the floor with your foot. 5. Hold this position for __________ seconds. 6. Slowly return to the starting position. Repeat __________ times. Complete this exercise __________ times a day. Strengthening exercises These exercises build strength and endurance in your hip and pelvis. Endurance is the ability to use your muscles for a long time, even after they get tired. Bridge This exercise strengthens the muscles that move your thigh backward (hip extensors). 1. Lie on your back on a firm surface with your knees bent and your feet flat on the floor. 2. Tighten your buttocks muscles and lift your buttocks off the floor until your trunk is level with your thighs. ? Do not arch  your back. ? You should feel the muscles working in your buttocks and the back of your thighs. If you do not feel these muscles, slide your feet 1-2 inches (2.5-5 cm) farther away from your buttocks. ? If this exercise is too easy, try doing it with your arms crossed over your chest. 3. Hold this position for __________ seconds. 4. Slowly lower your hips to the starting position. 5. Let your muscles relax completely after each repetition. Repeat __________ times. Complete this exercise __________ times a day. Squats This exercise strengthens the muscles in front of your thigh and knee (quadriceps). 1. Stand in front of a table, with your feet and knees pointing straight ahead. You may rest your hands on the table for balance but not for support. 2. Slowly bend your knees and lower your hips like you are going to sit in a chair. ? Keep your weight over your heels, not over your toes. ? Keep your lower legs upright so they are parallel with the table legs. ? Do not let your hips go lower than your knees. ? Do not bend lower than told by your health care provider. ? If your hip pain increases, do not bend as low. 3. Hold the squat position for __________ seconds. 4. Slowly push with your legs to return to standing. Do not use your hands to pull yourself to standing. Repeat __________ times. Complete this exercise __________ times a day. Hip hike 1. Stand  sideways on a bottom step. Stand on your left / right leg with your other foot unsupported next to the step. You can hold on to the railing or wall for balance if needed. 2. Keep your knees straight and your torso square. Then lift your left / right hip up toward the ceiling. 3. Hold this position for __________ seconds. 4. Slowly let your left / right hip lower toward the floor, past the starting position. Your foot should get closer to the floor. Do not lean or bend your knees. Repeat __________ times. Complete this exercise __________ times a  day. Single leg stand 1. Without shoes, stand near a railing or in a doorway. You may hold on to the railing or door frame as needed for balance. 2. Squeeze your left / right buttock muscles, then lift up your other foot. ? Do not let your left / right hip push out to the side. ? It is helpful to stand in front of a mirror for this exercise so you can watch your hip. 3. Hold this position for __________ seconds. Repeat __________ times. Complete this exercise __________ times a day. This information is not intended to replace advice given to you by your health care provider. Make sure you discuss any questions you have with your health care provider. Document Revised: 09/03/2018 Document Reviewed: 09/03/2018 Elsevier Patient Education  [place].

## 2019-08-30 ENCOUNTER — Other Ambulatory Visit: Payer: Self-pay | Admitting: Cardiovascular Disease

## 2019-09-05 ENCOUNTER — Other Ambulatory Visit: Payer: Self-pay

## 2019-09-05 ENCOUNTER — Ambulatory Visit
Admission: RE | Admit: 2019-09-05 | Discharge: 2019-09-05 | Disposition: A | Discharge status: Home / Self Care | Source: Ambulatory Visit | Attending: Family Medicine | Admitting: Family Medicine

## 2019-09-05 DIAGNOSIS — Z1231 Encounter for screening mammogram for malignant neoplasm of breast: Secondary | ICD-10-CM

## 2019-09-26 NOTE — Progress Notes (Signed)
Cardiology Clinic Note   Patient Name: Kimberly Stein Date of Encounter: 09/27/2019  Primary Care Provider:  Eulas Post, MD Primary Cardiologist:  Skeet Latch, MD  Patient Profile    Kimberly Stein 64 year old female presents today for follow-up evaluation of her hypertension, palpitations, and hyperlipidemia.  Past Medical History    Past Medical History:  Diagnosis Date  . Allergy    sinus infections  . Anemia   . Arthritis   . Celiac disease   . Fibromyalgia   . GERD (gastroesophageal reflux disease)   . Heart murmur   . Hiatal hernia   . Hyperlipidemia   . Hypertension   . Meniere's disease   . Neuromuscular disorder (HCC)    fibromyalgia  . Thyroid disease    hypothyroid  . 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 CARDIAC CATHETERIZATION N/A 02/25/2015   Procedure: Left Heart Cath and Coronary Angiography;  Surgeon: Peter M Martinique, MD;  Location: [facility];  Service: Cardiovascular;  Laterality: N/A;  . CERVICAL DISC SURGERY    . CHOLECYSTECTOMY    . COLONOSCOPY    . DILATION AND CURETTAGE OF UTERUS    . HAND SURGERY Right    right hand; calcium deposit on wrist and under thumb and ganglion cyst and carpal tunnel  . lumbar radiofrequency nerve ablation     twice yearly  . MOUTH SURGERY  2016   couple times due to gum disease  . ROTATOR CUFF REPAIR Left 06/07/2015   left shoulder  . TOTAL ABDOMINAL HYSTERECTOMY    . UPPER GASTROINTESTINAL ENDOSCOPY      Allergies  Allergies  Allergen Reactions  . Crestor [Rosuvastatin] Other (See Comments)    Bilateral Leg pain  . Lipitor [Atorvastatin Calcium]     Muscle aches  . Nsaids   . Nortriptyline Rash  . Nortriptyline Hcl Rash    Red rash  . Vicodin [Hydrocodone-Acetaminophen] Itching    History of Present Illness    Kimberly Stein has past medical history of hypertension, palpitations, HLD, celiac's disease, fibromyalgia,  GERD, and tobacco abuse.  She underwent cardiac catheterization 10/16 which showed normal coronary arteries.  She has known fibromyalgia related to atorvastatin therapy.  Due to her lack of ASCVD she is not a candidate for PCSK9 inhibition.  She has been working with her PCP on lipid management.  She was seen by Dr. Oval Linsey on 09/12/2018 and indicated that she had chronic lower back pain.  She was receiving treatment from orthopedics in the form of lumbar radiofrequency ablation.  She continues to smoke.  She contacted the office on 04/15/2019 with complaints of worsening palpitations in the evening.  She indicated that she did drink cappuccino in the morning but did not have caffeine later in the day.  She was last seen by Jory Sims on 04/16/2019.  During that time she described episodes of belching and chest pressure substernally.  She also noted extra palpitations.  She was only eating 1 meal per day along with snacks.  She denied fatigue, and dyspnea with exertion.  She was started on magnesium for her palpitations and her chest pressure was felt to be related to GI causes.  She was asked to see her GI provider for recommendations and Gas-X/Mylanta was recommended for gas relief.  She presents the clinic today for follow-up evaluation and states she has been doing well. Since starting magnesium after seen Jory Sims, DNP  she has had no recurrence of palpitations. She underwent a work-up with GI after that visit as well which was unremarkable. She states that she continues to have trouble with her bowels and has to watch her diet fairly closely due to her celiac's disease. She is also working closely with her rheumatologist for her fibromyalgia. She states that she occasionally has cramping under her bilateral breasts. This is relieved by taking mustard. Her recent lab work is all WDL. I will have her follow-up with Dr. Oval Linsey in 6 months.  Today she denies chest pain, shortness of breath,  lower extremity edema, fatigue, palpitations, melena, hematuria, hemoptysis, diaphoresis, weakness, presyncope, syncope, orthopnea, and PND.   Home Medications    Prior to Admission medications   Medication Sig Start Date End Date Taking? Authorizing Provider  albuterol (VENTOLIN HFA) 108 (90 Base) MCG/ACT inhaler Inhale 2 puffs into the lungs 2 (two) times daily. NEEDS OFFICE VISIT FOR FURTHER REFILLS 08/05/19   Eulas Post, MD  Calcium Carb-Cholecalciferol (CALCIUM 600 + D PO) Take by mouth 2 (two) times daily.    [provider]  chlorthalidone (HYGROTON) 25 MG tablet Take 1 tablet (25 mg total) by mouth daily. 08/26/19   Burchette, Alinda Sierras, MD  cyclobenzaprine (FLEXERIL) 10 MG tablet TAKE 1 TABLET BY MOUTH EVERY NIGHT AT BEDTIME 07/10/19   Bo Merino, MD  fenofibrate (TRICOR) 145 MG tablet TAKE 1 TABLET(145 MG) BY MOUTH DAILY 08/30/19   Skeet Latch, MD  levothyroxine (SYNTHROID) 150 MCG tablet Take 1 tablet (150 mcg total) by mouth daily before breakfast. NEEDS VISIT FOR FURTHER REFILLS 08/13/19   Eulas Post, MD  losartan (COZAAR) 50 MG tablet TAKE 1 TABLET(50 MG) BY MOUTH DAILY 08/21/19   Burchette, Alinda Sierras, MD  omeprazole (PRILOSEC) 40 MG capsule TAKE 1 CAPSULE BY MOUTH EVERY DAY 30-60 MINUTES BEFORE DINNER 04/17/19   Zehr, Janett Billow D, PA-C  potassium chloride (KLOR-CON M10) 10 MEQ tablet Take 1 tablet (10 mEq total) by mouth daily. 08/26/19   Burchette, Alinda Sierras, MD  PSYLLIUM HUSK PO Take by mouth. Take 3 pills daily    [provider]    Family History    Family History  Problem Relation Age of Onset  . Celiac disease Mother   . Diabetes Mother   . Heart disease Mother   . Prostate cancer Father   . Breast cancer Maternal Aunt   . Diabetes Sister   . Meniere's disease Sister   . Celiac disease Sister   . Diabetes Sister   . Colon cancer Neg Hx   . Esophageal cancer Neg Hx   . Stomach cancer Neg Hx   . Rectal cancer Neg Hx   . Liver cancer  Neg Hx   . Pancreatic cancer Neg Hx    She indicated that her mother is deceased. She indicated that her father is deceased. She indicated that all of her three sisters are alive. She indicated that the status of her maternal aunt is unknown. She indicated that the status of her neg hx is unknown.  Social History    Social History   Socioeconomic History  . Marital status: Married    Spouse name: Not on file  . Number of children: 0  . Years of education: Not on file  . Highest education level: Not on file  Occupational History  . Occupation: Housewife  Tobacco Use  . Smoking status: Current Every Day Smoker    Packs/day: 1.00    Years:  46.00    Pack years: 46.00    Types: Cigarettes  . Smokeless tobacco: Never Used  Substance and Sexual Activity  . Alcohol use: No    Alcohol/week: 0.0 standard drinks  . Drug use: No  . Sexual activity: Not on file  Other Topics Concern  . Not on file  Social History Narrative  . Not on file   Social Determinants of Health   Financial Resource Strain:   . Difficulty of Paying Living Expenses:   Food Insecurity:   . Worried About Charity fundraiser in the Last Year:   . Arboriculturist in the Last Year:   Transportation Needs:   . Film/video editor (Medical):   [name] Lack of Transportation (Non-Medical):   Physical Activity:   . Days of Exercise per Week:   . Minutes of Exercise per Session:   Stress:   . Feeling of Stress :   Social Connections:   . Frequency of Communication with Friends and Family:   . Frequency of Social Gatherings with Friends and Family:   . Attends Religious Services:   . Active Member of Clubs or Organizations:   . Attends Archivist Meetings:   [name] Marital Status:   Intimate Partner Violence:   . Fear of Current or Ex-Partner:   . Emotionally Abused:   [name] Physically Abused:   . Sexually Abused:      Review of Systems    General:  No chills, fever, night sweats or weight changes.    Cardiovascular:  No chest pain, dyspnea on exertion, edema, orthopnea, palpitations, paroxysmal nocturnal dyspnea. Dermatological: No rash, lesions/masses Respiratory: No cough, dyspnea Urologic: No hematuria, dysuria Abdominal:   No nausea, vomiting, diarrhea, bright red blood per rectum, melena, or hematemesis Neurologic:  No visual changes, wkns, changes in mental status. All other systems reviewed and are otherwise negative except as noted above.  Physical Exam    VS:  BP 122/88   Pulse 68   Temp 98.1 F (36.7 C)   Ht 5' 3"  (1.6 m)   Wt 153 lb 3.2 oz (69.5 kg)   SpO2 99%   BMI 27.14 kg/m  , BMI Body mass index is 27.14 kg/m. GEN: Well nourished, well developed, in no acute distress. HEENT: normal. Neck: Supple, no JVD, carotid bruits, or masses. Cardiac: RRR, no murmurs, rubs, or gallops. No clubbing, cyanosis, edema.  Radials/DP/PT 2+ and equal bilaterally.  Respiratory:  Respirations regular and unlabored, clear to auscultation bilaterally. GI: Soft, nontender, nondistended, BS + x 4. MS: no deformity or atrophy. Skin: warm and dry, no rash. Neuro:  Strength and sensation are intact. Psych: Normal affect.  Accessory Clinical Findings    ECG personally reviewed by me today-normal sinus rhythm rightward axis deviation 68 bpm- No acute changes  EKG 08/23/2017 Sinus bradycardia 56 bpm  Cardiac catheterization 02/25/2015 No significant CAD 2. Normal LV EDP.  Plan: risk factor modification.  Assessment & Plan   1.  Palpitations-no recent episodes of palpitations. Last episode 3 months ago. EKG today shows normal sinus rhythm rightward axis deviation 68 bpm. Continue current medical therapy Avoid triggers caffeine, chocolate, EtOH etc. Heart healthy low-sodium diet Increase physical activity as tolerated  Essential hypertension-BP today 122/88.  Well-controlled at home Continue chlorthalidone, losartan Heart healthy low-sodium diet-salty 6 given Increase physical  activity as tolerated  Hyperlipidemia-08/21/2019: Cholesterol 237; HDL 62.30; LDL Cholesterol 146; Triglycerides 145.0; VLDL 29.0 not a candidate for PCSK9 inhibitor due to lack of  ASCVD.  Continue fenofibrate Continue psyllium husk Heart healthy low-sodium high-fiber diet-discussed increasing fiber with each meal increase physical activity as tolerated Followed by PCP  Follow-up with Dr. Oval Linsey in 6 months.    Jossie Ng. Ceniyah Thorp NP-C    09/27/2019, 9:35 AM [address] [place] [address] Office [phone] Fax [phone]

## 2019-09-27 ENCOUNTER — Encounter: Payer: Self-pay | Admitting: General Practice

## 2019-09-27 ENCOUNTER — Other Ambulatory Visit: Payer: Self-pay

## 2019-09-27 ENCOUNTER — Ambulatory Visit (INDEPENDENT_AMBULATORY_CARE_PROVIDER_SITE_OTHER): Admitting: General Practice

## 2019-09-27 VITALS — BP 122/88 | HR 68 | Temp 98.1°F | Ht 63.0 in | Wt 153.2 lb

## 2019-09-27 DIAGNOSIS — R002 Palpitations: Secondary | ICD-10-CM

## 2019-09-27 DIAGNOSIS — I10 Essential (primary) hypertension: Secondary | ICD-10-CM | POA: Diagnosis not present

## 2019-09-27 DIAGNOSIS — E7800 Pure hypercholesterolemia, unspecified: Secondary | ICD-10-CM

## 2019-09-27 NOTE — Patient Instructions (Signed)
Medication Instructions:  Your Physician recommend you continue on your current medication as directed.    *If you need a refill on your cardiac medications before your next appointment, please call your pharmacy*   Lab Work: None  Testing/Procedures: None   Follow-Up: At [facility], you and your health needs are our priority.  As part of our continuing mission to provide you with exceptional heart care, we have created designated Provider Care Teams.  These Care Teams include your primary Cardiologist (physician) and Advanced Practice Providers (APPs -  Physician Assistants and Nurse Practitioners) who all work together to provide you with the care you need, when you need it.  We recommend signing up for the patient portal called "MyChart".  Sign up information is provided on this After Visit Summary.  MyChart is used to connect with patients for Virtual Visits (Telemedicine).  Patients are able to view lab/test results, encounter notes, upcoming appointments, etc.  Non-urgent messages can be sent to your provider as well.   To learn more about what you can do with MyChart, go to [email or web address].    Your next appointment:   6 month(s)  The format for your next appointment:   In Person  Provider:   Skeet Latch, MD

## 2019-09-28 ENCOUNTER — Other Ambulatory Visit: Payer: Self-pay | Admitting: Family Medicine

## 2019-10-15 ENCOUNTER — Other Ambulatory Visit: Payer: Self-pay | Admitting: *Deleted

## 2019-10-15 MED ORDER — CYCLOBENZAPRINE HCL 10 MG PO TABS: 10.0000 mg | ORAL_TABLET | Freq: Every day | ORAL | 0 refills | Status: DC

## 2019-10-15 NOTE — Telephone Encounter (Signed)
Refill request received via fax  Last Visit: 09/06/2019 Next Visit: 02/27/2020  Okay to refill Cyclobenzaprine?

## 2019-10-21 ENCOUNTER — Other Ambulatory Visit: Payer: Self-pay | Admitting: Gastroenterology

## 2019-11-10 ENCOUNTER — Other Ambulatory Visit: Payer: Self-pay | Admitting: Family Medicine

## 2019-11-19 ENCOUNTER — Other Ambulatory Visit: Payer: Self-pay | Admitting: Family Medicine

## 2019-11-20 ENCOUNTER — Other Ambulatory Visit

## 2019-11-26 ENCOUNTER — Encounter: Payer: Self-pay | Admitting: Orthopaedic Surgery

## 2019-11-26 ENCOUNTER — Other Ambulatory Visit: Payer: Self-pay

## 2019-11-26 ENCOUNTER — Telehealth: Payer: Self-pay

## 2019-11-26 ENCOUNTER — Ambulatory Visit (INDEPENDENT_AMBULATORY_CARE_PROVIDER_SITE_OTHER)

## 2019-11-26 ENCOUNTER — Ambulatory Visit (INDEPENDENT_AMBULATORY_CARE_PROVIDER_SITE_OTHER): Admitting: Orthopaedic Surgery

## 2019-11-26 VITALS — Ht 63.0 in | Wt 150.0 lb

## 2019-11-26 DIAGNOSIS — G8929 Other chronic pain: Secondary | ICD-10-CM | POA: Diagnosis not present

## 2019-11-26 DIAGNOSIS — M545 Low back pain, unspecified: Secondary | ICD-10-CM | POA: Insufficient documentation

## 2019-11-26 DIAGNOSIS — M7541 Impingement syndrome of right shoulder: Secondary | ICD-10-CM | POA: Diagnosis not present

## 2019-11-26 DIAGNOSIS — M47816 Spondylosis without myelopathy or radiculopathy, lumbar region: Secondary | ICD-10-CM | POA: Diagnosis not present

## 2019-11-26 MED ORDER — OXYCODONE HCL 5 MG PO CAPS: 5.0000 mg | ORAL_CAPSULE | Freq: Four times a day (QID) | ORAL | 0 refills | Status: DC | PRN

## 2019-11-26 NOTE — Progress Notes (Signed)
Office Visit Note   Patient: Kimberly Stein           Date of Birth: Jul 15, 1955           MRN: [id number] Visit Date: 11/26/2019              Requested by: Eulas Post, MD [place],  [place] [address] PCP: Eulas Post, MD   Assessment & Plan: Visit Diagnoses:  1. Acute midline low back pain, unspecified whether sciatica present   2. Spondylosis of lumbar region without myelopathy or radiculopathy   3. Chronic midline low back pain without sciatica   4. Impingement syndrome of right shoulder     Plan:  #1: At this time we are going to send her to physical therapy for modalities #2: She will call Dr. Maryjean Ka to see if he has ability to give her some relief with facet injections #3: She should make an appointment see Dr. Ronnald Ramp about possible surgical intervention in the future #4: oxyIR for pain #5: Voltaren gel for the lumbar spine and right shoulder  Follow-Up Instructions: Return in about 3 weeks (around 12/17/2019) for follow up.   Orders:  Orders Placed This Encounter  Procedures  . XR Lumbar Spine 2-3 Views   No orders of the defined types were placed in this encounter.     Procedures: No procedures performed   Clinical Data: No additional findings.   Subjective: Chief Complaint  Patient presents with  . Lower Back - Pain   HPI Patient states that she strained her lower back picking up a box of tomatoes on Friday July 2nd. She said that the next morning her back was painful, but has worsened since. She cannot sit comfortably and cannot sleep. Her pain is located in her mid lower back. No pain radiates down her legs. No numbness, tingling, or weakness in her her legs. She states that she is not taking anything for pain.    Review of Systems  Constitutional: Negative for fatigue.  HENT: Negative for ear pain.   Eyes: Negative for pain.  Respiratory: Negative for shortness of breath.   Cardiovascular: Negative for leg  swelling.  Gastrointestinal: Negative for constipation and diarrhea.  Endocrine: Negative for cold intolerance and heat intolerance.  Genitourinary: Negative for difficulty urinating.  Musculoskeletal: Negative for joint swelling.  Skin: Negative for rash.  Allergic/Immunologic: Positive for food allergies.  Neurological: Negative for weakness.  Hematological: Does not bruise/bleed easily.  Psychiatric/Behavioral: Positive for sleep disturbance.     Objective: Vital Signs: Ht 5' 3"  (1.6 m)   Wt 150 lb (68 kg)   BMI 26.57 kg/m   Physical Exam Constitutional:      Appearance: She is well-developed.  Eyes:     Pupils: Pupils are equal, round, and reactive to light.  Pulmonary:     Effort: Pulmonary effort is normal.  Skin:    General: Skin is warm and dry.  Neurological:     Mental Status: She is alert and oriented to person, place, and time.  Psychiatric:        Behavior: Behavior normal.     Ortho Exam  Exam today reveals negative straight leg raising bilaterally.  She has excellent strength in the lower extremities.  Deep tendon reflexes 2+ at the knee absent at the ankle bilateral symmetric.  Good motion of her hips.  Sensation is intact to light touch bilaterally in the lower extremities and equal.  Right shoulder reveals decreased  range of motion to only about 90 degrees of abduction and forward flexion before she elicits pain.  External rotation with the arm at 90 degrees of abduction to about 80 degrees in forward flexion or internal rotation to around 60 to 70 degrees but is able to reach behind her back.  Specialty Comments:  No specialty comments available.  Imaging: XR Lumbar Spine 2-3 Views  Result Date: 11/26/2019 2 view x-ray lumbar spine reveals degenerative changes in the facets that L4-5 and S1.  There is also marked to space narrowing at L5-S1 with possible very minimal retrolisthesis of L2 5 on S1.  Some foraminal narrowing also at that area.  She is  maintaining her lordotic curve.  Again significant facet changes diffusely about the lower lumbar spine.    PMFS History: Current Outpatient Medications  Medication Sig Dispense Refill  . albuterol (VENTOLIN HFA) 108 (90 Base) MCG/ACT inhaler INHALE 2 PUFFS INTO THE LUNGS TWICE DAILY 18 g 3  . Calcium Carb-Cholecalciferol (CALCIUM 600 + D PO) Take by mouth 2 (two) times daily.    . chlorthalidone (HYGROTON) 25 MG tablet Take 1 tablet (25 mg total) by mouth daily. 90 tablet 3  . cyclobenzaprine (FLEXERIL) 10 MG tablet Take 1 tablet (10 mg total) by mouth at bedtime. 90 tablet 0  . ezetimibe (ZETIA) 10 MG tablet TAKE 1 TABLET(10 MG) BY MOUTH DAILY 30 tablet 2  . fenofibrate (TRICOR) 145 MG tablet TAKE 1 TABLET(145 MG) BY MOUTH DAILY 90 tablet 1  . levothyroxine (SYNTHROID) 150 MCG tablet TAKE 1 TABLET(150 MCG) BY MOUTH DAILY BEFORE BREAKFAST 90 tablet 0  . losartan (COZAAR) 50 MG tablet TAKE 1 TABLET(50 MG) BY MOUTH DAILY 90 tablet 3  . omeprazole (PRILOSEC) 40 MG capsule TAKE 1 CAPSULE BY MOUTH EVERY DAY 30-60 MINUTES BEFORE DINNER 90 capsule 1  . potassium chloride (KLOR-CON M10) 10 MEQ tablet Take 1 tablet (10 mEq total) by mouth daily. 90 tablet 3  . PSYLLIUM HUSK PO Take by mouth. Take 3 pills daily     No current facility-administered medications for this visit.    Patient Active Problem List   Diagnosis Date Noted  . Low back pain 11/26/2019  . Impingement syndrome of right shoulder 11/26/2019  . Belching 04/17/2019  . Screening for viral disease 04/17/2019  . Other fatigue 06/13/2016  . Primary insomnia 06/13/2016  . Primary osteoarthritis of both hands 06/13/2016  . Trochanteric bursitis of both hips 06/13/2016  . DDD cervical spine status post fusion 06/13/2016  . Facet arthritis, degenerative, lumbar spine 06/13/2016  . Tendinopathy of right rotator cuff 06/13/2016  . Chest pain, atypical 02/25/2015  . Abnormal nuclear stress test 02/25/2015  . Hyperlipidemia 07/17/2013    . Osteopenia 01/28/2012  . 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 CELIAC SPRUE 09/29/2009  . Hypothyroidism 09/07/2009  . Meniere's disease 09/07/2009  . Essential hypertension 09/07/2009  . RUQ PAIN 09/07/2009  . Fibromyalgia 06/12/2009  . CERVICALGIA 06/03/2009  . ANEMIA-IRON DEFICIENCY 10/21/2008  . Gastroesophageal reflux disease 10/21/2008  . DIZZINESS 10/21/2008  . COLONIC POLYPS, HX OF 10/21/2008   Past Medical History:  Diagnosis Date  . Allergy    sinus infections  . Anemia   . Arthritis   . Celiac disease   . Fibromyalgia   . GERD (gastroesophageal reflux disease)   . Heart murmur   . Hiatal hernia   . Hyperlipidemia   . Hypertension   . Meniere's disease   . Neuromuscular disorder ([place])  fibromyalgia  . Thyroid disease    hypothyroid  . 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 Celiac disease Mother   . Diabetes Mother   . Heart disease Mother   . Prostate cancer Father   . Breast cancer Maternal Aunt   . Diabetes Sister   . Meniere's disease Sister   . Celiac disease Sister   . Diabetes Sister   . Colon cancer Neg Hx   . Esophageal cancer Neg Hx   . Stomach cancer Neg Hx   . Rectal cancer Neg Hx   . Liver cancer Neg Hx   . Pancreatic cancer Neg Hx     Past Surgical History:  Procedure Laterality Date  . CARDIAC CATHETERIZATION N/A 02/25/2015   Procedure: Left Heart Cath and Coronary Angiography;  Surgeon: Peter M Martinique, MD;  Location: [facility];  Service: Cardiovascular;  Laterality: N/A;  . CERVICAL DISC SURGERY    . CHOLECYSTECTOMY    . COLONOSCOPY    . DILATION AND CURETTAGE OF UTERUS    . HAND SURGERY Right    right hand; calcium deposit on wrist and under thumb and ganglion cyst and carpal tunnel  . lumbar radiofrequency nerve ablation     twice yearly  . MOUTH SURGERY  2016   couple times due to gum disease  . ROTATOR CUFF REPAIR Left 06/07/2015   left shoulder  . TOTAL  ABDOMINAL HYSTERECTOMY    . UPPER GASTROINTESTINAL ENDOSCOPY     Social History   Occupational History  . Occupation: Housewife  Tobacco Use  . Smoking status: Current Every Day Smoker    Packs/day: 1.00    Years: 46.00    Pack years: 46.00    Types: Cigarettes  . Smokeless tobacco: Never Used  Vaping Use  . Vaping Use: Never used  Substance and Sexual Activity  . Alcohol use: No    Alcohol/week: 0.0 standard drinks  . Drug use: No  . Sexual activity: Not on file

## 2019-11-26 NOTE — Telephone Encounter (Signed)
Spoke with patient and worked in.

## 2019-11-26 NOTE — Telephone Encounter (Signed)
Patient called wanting to know if she could be worked in for today.  Stated that she sprained her back on Friday, 11/22/2019 and that she is not able to sleep or sit.  Cb# [phone].  Please advise.  Thank you.

## 2019-11-29 ENCOUNTER — Other Ambulatory Visit: Payer: Self-pay

## 2019-11-29 DIAGNOSIS — M545 Low back pain, unspecified: Secondary | ICD-10-CM

## 2019-11-29 DIAGNOSIS — G8929 Other chronic pain: Secondary | ICD-10-CM

## 2019-12-05 ENCOUNTER — Ambulatory Visit: Admitting: Physical Therapy

## 2019-12-11 ENCOUNTER — Ambulatory Visit: Admitting: Physical Therapy

## 2020-01-13 ENCOUNTER — Other Ambulatory Visit: Payer: Self-pay | Admitting: Rheumatology

## 2020-01-13 NOTE — Telephone Encounter (Signed)
Last Visit: 09/06/2019 Next Visit: 02/27/2020  Last Fill 10/15/2019  Okay to refill Cyclobenzaprine?

## 2020-01-13 NOTE — Telephone Encounter (Signed)
I called and discussed dosing with the patient.  She is in agreement to decrease Flexeril dose to 5 mg p.o. nightly.  Also advised her to use it only on a as needed basis.

## 2020-01-31 ENCOUNTER — Other Ambulatory Visit: Payer: Self-pay

## 2020-01-31 ENCOUNTER — Encounter: Payer: Self-pay | Admitting: Family Medicine

## 2020-01-31 ENCOUNTER — Telehealth (INDEPENDENT_AMBULATORY_CARE_PROVIDER_SITE_OTHER): Admitting: Family Medicine

## 2020-01-31 VITALS — Temp 96.8°F

## 2020-01-31 DIAGNOSIS — J019 Acute sinusitis, unspecified: Secondary | ICD-10-CM

## 2020-01-31 MED ORDER — CEPHALEXIN 500 MG PO CAPS
500.0000 mg | ORAL_CAPSULE | Freq: Three times a day (TID) | ORAL | 0 refills | Status: DC
Start: 2020-01-31 — End: 2020-02-07

## 2020-01-31 NOTE — Progress Notes (Signed)
   Subjective:    Patient ID: Kimberly Stein, female    DOB: 1955-07-14, 64 y.o.   MRN: [id number]  HPI Virtual Visit via Telephone Note  I connected with the patient on 01/31/20 at  9:30 AM EDT by telephone and verified that I am speaking with the correct person using two identifiers.   I discussed the limitations, risks, security and privacy concerns of performing an evaluation and management service by telephone and the availability of in person appointments. I also discussed with the patient that there may be a patient responsible charge related to this service. The patient expressed understanding and agreed to proceed.  Location patient: home Location provider: work or home office Participants present for the call: patient, provider Patient did not have a visit in the prior 7 days to address this/these issue(s).   History of Present Illness: Here for one month of sinus pressure, PND, and blowing green mucus from the nose. No fever or cough or SOB. No NVD. Using Afrin at times.    Observations/Objective: Patient sounds cheerful and well on the phone. I do not appreciate any SOB. Speech and thought processing are grossly intact. Patient reported vitals:  Assessment and Plan: Sinusitis, treat with Keflex. Add Mucinex BID.  Alysia Penna, MD   Follow Up Instructions:     [phone] [phone] 11-20 [id number] 21-30 I did not refer this patient for an OV in the next 24 hours for this/these issue(s).  I discussed the assessment and treatment plan with the patient. The patient was provided an opportunity to ask questions and all were answered. The patient agreed with the plan and demonstrated an understanding of the instructions.   The patient was advised to call back or seek an in-person evaluation if the symptoms worsen or if the condition fails to improve as anticipated.  I provided 14 minutes of non-face-to-face time during this encounter.   Alysia Penna, MD    Review of  Systems     Objective:   Physical Exam        Assessment & Plan:

## 2020-02-06 ENCOUNTER — Telehealth: Payer: Self-pay | Admitting: Family Medicine

## 2020-02-06 ENCOUNTER — Other Ambulatory Visit: Payer: Self-pay | Admitting: Family Medicine

## 2020-02-06 NOTE — Telephone Encounter (Signed)
Please advise 

## 2020-02-06 NOTE — Telephone Encounter (Signed)
Please advise, last OV 01/31/20.

## 2020-02-06 NOTE — Telephone Encounter (Signed)
Pt stated she had a sinus infection and saw Dr. Sarajane Jews on 9/10 and he px cephalexin. When she started taking it the next day she felt so sick and decided to stop and retry after her trip. She is back and still having the same issue she is wondering if something else can be px?   [facility] [place], [place] - [address] AT [address] OF [address] Phone:  [phone]  Fax:  [phone]

## 2020-02-07 MED ORDER — AMOXICILLIN-POT CLAVULANATE 875-125 MG PO TABS: 1.0000 | ORAL_TABLET | Freq: Two times a day (BID) | ORAL | 0 refills | Status: DC

## 2020-02-07 NOTE — Telephone Encounter (Signed)
Patient notified of update  and verbalized understanding. Pt denies Penicillin allergy. Rx sent to preferred pharmacy.

## 2020-02-07 NOTE — Addendum Note (Signed)
Addended by: Gwenyth Ober R on: 02/07/2020 08:45 AM   Modules accepted: Orders

## 2020-02-07 NOTE — Telephone Encounter (Signed)
Confirm no penicillin allergy.  If none, may send in Augmentin 875 mg po bid for 7 days.  If not better after that, recommend follow up.

## 2020-02-11 ENCOUNTER — Encounter: Payer: Self-pay | Admitting: Acute Care

## 2020-02-12 ENCOUNTER — Other Ambulatory Visit: Payer: Self-pay | Admitting: Rheumatology

## 2020-02-12 NOTE — Telephone Encounter (Signed)
Last Visit: 09/06/2019 Next Visit: 02/27/2020  Last Fill: 01/13/2020  Okay to refill Cyclobenzaprine?

## 2020-02-13 NOTE — Progress Notes (Deleted)
Office Visit Note  Patient: Kimberly Stein             Date of Birth: 1956/01/31           MRN: [id number]             PCP: Eulas Post, MD Referring: Eulas Post, MD Visit Date: 02/27/2020 Occupation: @GUAROCC @  Subjective:  No chief complaint on file.   History of Present Illness: Kimberly Stein is a 64 y.o. female ***   Activities of Daily Living:  Patient reports morning stiffness for *** {minute/hour:19697}.   Patient {ACTIONS;DENIES/REPORTS:21021675::"Denies"} nocturnal pain.  Difficulty dressing/grooming: {ACTIONS;DENIES/REPORTS:21021675::"Denies"} Difficulty climbing stairs: {ACTIONS;DENIES/REPORTS:21021675::"Denies"} Difficulty getting out of chair: {ACTIONS;DENIES/REPORTS:21021675::"Denies"} Difficulty using hands for taps, buttons, cutlery, and/or writing: {ACTIONS;DENIES/REPORTS:21021675::"Denies"}  No Rheumatology ROS completed.   PMFS History:  Patient Active Problem List   Diagnosis Date Noted  . Low back pain 11/26/2019  . Impingement syndrome of right shoulder 11/26/2019  . Belching 04/17/2019  . Screening for viral disease 04/17/2019  . Other fatigue 06/13/2016  . Primary insomnia 06/13/2016  . Primary osteoarthritis of both hands 06/13/2016  . Trochanteric bursitis of both hips 06/13/2016  . DDD cervical spine status post fusion 06/13/2016  . Facet arthritis, degenerative, lumbar spine 06/13/2016  . Tendinopathy of right rotator cuff 06/13/2016  . Chest pain, atypical 02/25/2015  . Abnormal nuclear stress test 02/25/2015  . Hyperlipidemia 07/17/2013  . Osteopenia 01/28/2012  . 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 CELIAC SPRUE 09/29/2009  . Hypothyroidism 09/07/2009  . Meniere's disease 09/07/2009  . Essential hypertension 09/07/2009  . RUQ PAIN 09/07/2009  . Fibromyalgia 06/12/2009  . CERVICALGIA 06/03/2009  . ANEMIA-IRON DEFICIENCY 10/21/2008  . Gastroesophageal reflux disease 10/21/2008  . DIZZINESS 10/21/2008  . COLONIC POLYPS,  HX OF 10/21/2008    Past Medical History:  Diagnosis Date  . Allergy    sinus infections  . Anemia   . Arthritis   . Celiac disease   . Fibromyalgia   . GERD (gastroesophageal reflux disease)   . Heart murmur   . Hiatal hernia   . Hyperlipidemia   . Hypertension   . Meniere's disease   . Neuromuscular disorder (HCC)    fibromyalgia  . Thyroid disease    hypothyroid  . 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 Celiac disease Mother   . Diabetes Mother   . Heart disease Mother   . Prostate cancer Father   . Breast cancer Maternal Aunt   . Diabetes Sister   . Meniere's disease Sister   . Celiac disease Sister   . Diabetes Sister   . Colon cancer Neg Hx   . Esophageal cancer Neg Hx   . Stomach cancer Neg Hx   . Rectal cancer Neg Hx   . Liver cancer Neg Hx   . Pancreatic cancer Neg Hx    Past Surgical History:  Procedure Laterality Date  . CARDIAC CATHETERIZATION N/A 02/25/2015   Procedure: Left Heart Cath and Coronary Angiography;  Surgeon: Peter M Martinique, MD;  Location: [facility];  Service: Cardiovascular;  Laterality: N/A;  . CERVICAL DISC SURGERY    . CHOLECYSTECTOMY    . COLONOSCOPY    . DILATION AND CURETTAGE OF UTERUS    . HAND SURGERY Right    right hand; calcium deposit on wrist and under thumb and ganglion cyst and carpal tunnel  . lumbar radiofrequency nerve ablation  twice yearly  . MOUTH SURGERY  2016   couple times due to gum disease  . ROTATOR CUFF REPAIR Left 06/07/2015   left shoulder  . TOTAL ABDOMINAL HYSTERECTOMY    . UPPER GASTROINTESTINAL ENDOSCOPY     Social History   Social History Narrative  . Not on file   Immunization History  Administered Date(s) Administered  . Pneumococcal Conjugate-13 07/17/2013  . Tdap 06/26/2013  . Zoster 07/17/2013     Objective: Vital Signs: There were no vitals taken for this visit.   Physical Exam   Musculoskeletal Exam: ***  CDAI  Exam: CDAI Score: -- Patient Global: --; Provider Global: -- Swollen: --; Tender: -- Joint Exam 02/27/2020   No joint exam has been documented for this visit   There is currently no information documented on the homunculus. Go to the Rheumatology activity and complete the homunculus joint exam.  Investigation: No additional findings.  Imaging: No results found.  Recent Labs: Lab Results  Component Value Date   WBC 8.0 08/21/2019   HGB 13.4 08/21/2019   PLT 199.0 08/21/2019   NA 140 08/21/2019   K 4.2 08/21/2019   CL 100 08/21/2019   CO2 32 08/21/2019   GLUCOSE 87 08/21/2019   BUN 17 08/21/2019   CREATININE 0.77 08/21/2019   BILITOT 0.4 08/21/2019   ALKPHOS 26 (L) 08/21/2019   AST 16 08/21/2019   ALT 14 08/21/2019   PROT 7.1 08/21/2019   ALBUMIN 4.5 08/21/2019   CALCIUM 9.8 08/21/2019   GFRAA 85 12/12/2016    Speciality Comments: No specialty comments available.  Procedures:  No procedures performed Allergies: Crestor [rosuvastatin], Lipitor [atorvastatin calcium], Nsaids, Nortriptyline, Nortriptyline hcl, and Vicodin [hydrocodone-acetaminophen]   Assessment / Plan:     Visit Diagnoses: No diagnosis found.  Orders: No orders of the defined types were placed in this encounter.  No orders of the defined types were placed in this encounter.   Face-to-face time spent with patient was *** minutes. Greater than 50% of time was spent in counseling and coordination of care.  Follow-Up Instructions: No follow-ups on file.   Earnestine Mealing, CMA  Note - This record has been created using Editor, commissioning.  Chart creation errors have been sought, but may not always  have been located. Such creation errors do not reflect on  the standard of medical care.

## 2020-02-21 ENCOUNTER — Other Ambulatory Visit: Payer: Self-pay | Admitting: Cardiovascular Disease

## 2020-02-25 ENCOUNTER — Telehealth: Payer: Self-pay | Admitting: Cardiovascular Disease

## 2020-02-25 NOTE — Telephone Encounter (Signed)
lvm for patient to return call to get appointment scheduled with Oval Linsey from recall list

## 2020-02-27 ENCOUNTER — Ambulatory Visit: Admitting: Rheumatology

## 2020-03-04 NOTE — Progress Notes (Deleted)
Office Visit Note  Patient: Kimberly Stein             Date of Birth: Apr 02, 1956           MRN: [id number]             PCP: Eulas Post, MD Referring: Eulas Post, MD Visit Date: 03/17/2020 Occupation: @GUAROCC @  Subjective:  No chief complaint on file.   History of Present Illness: Kimberly Stein is a 64 y.o. female ***   Activities of Daily Living:  Patient reports morning stiffness for *** {minute/hour:19697}.   Patient {ACTIONS;DENIES/REPORTS:21021675::"Denies"} nocturnal pain.  Difficulty dressing/grooming: {ACTIONS;DENIES/REPORTS:21021675::"Denies"} Difficulty climbing stairs: {ACTIONS;DENIES/REPORTS:21021675::"Denies"} Difficulty getting out of chair: {ACTIONS;DENIES/REPORTS:21021675::"Denies"} Difficulty using hands for taps, buttons, cutlery, and/or writing: {ACTIONS;DENIES/REPORTS:21021675::"Denies"}  No Rheumatology ROS completed.   PMFS History:  Patient Active Problem List   Diagnosis Date Noted  . Low back pain 11/26/2019  . Impingement syndrome of right shoulder 11/26/2019  . Belching 04/17/2019  . Screening for viral disease 04/17/2019  . Other fatigue 06/13/2016  . Primary insomnia 06/13/2016  . Primary osteoarthritis of both hands 06/13/2016  . Trochanteric bursitis of both hips 06/13/2016  . DDD cervical spine status post fusion 06/13/2016  . Facet arthritis, degenerative, lumbar spine 06/13/2016  . Tendinopathy of right rotator cuff 06/13/2016  . Chest pain, atypical 02/25/2015  . Abnormal nuclear stress test 02/25/2015  . Hyperlipidemia 07/17/2013  . Osteopenia 01/28/2012  . 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 CELIAC SPRUE 09/29/2009  . Hypothyroidism 09/07/2009  . Meniere's disease 09/07/2009  . Essential hypertension 09/07/2009  . RUQ PAIN 09/07/2009  . Fibromyalgia 06/12/2009  . CERVICALGIA 06/03/2009  . ANEMIA-IRON DEFICIENCY 10/21/2008  . Gastroesophageal reflux disease 10/21/2008  . DIZZINESS 10/21/2008  . COLONIC POLYPS,  HX OF 10/21/2008    Past Medical History:  Diagnosis Date  . Allergy    sinus infections  . Anemia   . Arthritis   . Celiac disease   . Fibromyalgia   . GERD (gastroesophageal reflux disease)   . Heart murmur   . Hiatal hernia   . Hyperlipidemia   . Hypertension   . Meniere's disease   . Neuromuscular disorder (HCC)    fibromyalgia  . Thyroid disease    hypothyroid  . 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 Celiac disease Mother   . Diabetes Mother   . Heart disease Mother   . Prostate cancer Father   . Breast cancer Maternal Aunt   . Diabetes Sister   . Meniere's disease Sister   . Celiac disease Sister   . Diabetes Sister   . Colon cancer Neg Hx   . Esophageal cancer Neg Hx   . Stomach cancer Neg Hx   . Rectal cancer Neg Hx   . Liver cancer Neg Hx   . Pancreatic cancer Neg Hx    Past Surgical History:  Procedure Laterality Date  . CARDIAC CATHETERIZATION N/A 02/25/2015   Procedure: Left Heart Cath and Coronary Angiography;  Surgeon: Peter M Martinique, MD;  Location: [facility];  Service: Cardiovascular;  Laterality: N/A;  . CERVICAL DISC SURGERY    . CHOLECYSTECTOMY    . COLONOSCOPY    . DILATION AND CURETTAGE OF UTERUS    . HAND SURGERY Right    right hand; calcium deposit on wrist and under thumb and ganglion cyst and carpal tunnel  . lumbar radiofrequency nerve ablation  twice yearly  . MOUTH SURGERY  2016   couple times due to gum disease  . ROTATOR CUFF REPAIR Left 06/07/2015   left shoulder  . TOTAL ABDOMINAL HYSTERECTOMY    . UPPER GASTROINTESTINAL ENDOSCOPY     Social History   Social History Narrative  . Not on file   Immunization History  Administered Date(s) Administered  . Pneumococcal Conjugate-13 07/17/2013  . Tdap 06/26/2013  . Zoster 07/17/2013     Objective: Vital Signs: There were no vitals taken for this visit.   Physical Exam   Musculoskeletal Exam: ***  CDAI  Exam: CDAI Score: -- Patient Global: --; Provider Global: -- Swollen: --; Tender: -- Joint Exam 03/17/2020   No joint exam has been documented for this visit   There is currently no information documented on the homunculus. Go to the Rheumatology activity and complete the homunculus joint exam.  Investigation: No additional findings.  Imaging: No results found.  Recent Labs: Lab Results  Component Value Date   WBC 8.0 08/21/2019   HGB 13.4 08/21/2019   PLT 199.0 08/21/2019   NA 140 08/21/2019   K 4.2 08/21/2019   CL 100 08/21/2019   CO2 32 08/21/2019   GLUCOSE 87 08/21/2019   BUN 17 08/21/2019   CREATININE 0.77 08/21/2019   BILITOT 0.4 08/21/2019   ALKPHOS 26 (L) 08/21/2019   AST 16 08/21/2019   ALT 14 08/21/2019   PROT 7.1 08/21/2019   ALBUMIN 4.5 08/21/2019   CALCIUM 9.8 08/21/2019   GFRAA 85 12/12/2016    Speciality Comments: No specialty comments available.  Procedures:  No procedures performed Allergies: Crestor [rosuvastatin], Lipitor [atorvastatin calcium], Nsaids, Nortriptyline, Nortriptyline hcl, and Vicodin [hydrocodone-acetaminophen]   Assessment / Plan:     Visit Diagnoses: No diagnosis found.  Orders: No orders of the defined types were placed in this encounter.  No orders of the defined types were placed in this encounter.   Face-to-face time spent with patient was *** minutes. Greater than 50% of time was spent in counseling and coordination of care.  Follow-Up Instructions: No follow-ups on file.   Earnestine Mealing, CMA  Note - This record has been created using Editor, commissioning.  Chart creation errors have been sought, but may not always  have been located. Such creation errors do not reflect on  the standard of medical care.

## 2020-03-11 ENCOUNTER — Other Ambulatory Visit: Payer: Self-pay | Admitting: Rheumatology

## 2020-03-12 NOTE — Telephone Encounter (Signed)
Last Visit: 09/06/2019 Next Visit: 03/17/2020  Last Fill: 02/12/2020  Okay to refill Cyclobenzaprine?

## 2020-03-16 ENCOUNTER — Telehealth: Payer: Self-pay | Admitting: Rheumatology

## 2020-03-16 NOTE — Telephone Encounter (Signed)
Patient left a message stating she wants to cancel appointment for tomorrow 10/26, and reschedule to a virtual appointment. Is it okay to do a virtual phone appointment/ If so, where can it be scheduled? Please advise.

## 2020-03-16 NOTE — Telephone Encounter (Signed)
It is okay to schedule virtual visit next available.

## 2020-03-17 ENCOUNTER — Ambulatory Visit: Admitting: Rheumatology

## 2020-03-17 DIAGNOSIS — M67911 Unspecified disorder of synovium and tendon, right shoulder: Secondary | ICD-10-CM

## 2020-03-17 DIAGNOSIS — M797 Fibromyalgia: Secondary | ICD-10-CM

## 2020-03-17 DIAGNOSIS — Z8639 Personal history of other endocrine, nutritional and metabolic disease: Secondary | ICD-10-CM

## 2020-03-17 DIAGNOSIS — M8589 Other specified disorders of bone density and structure, multiple sites: Secondary | ICD-10-CM

## 2020-03-17 DIAGNOSIS — F5101 Primary insomnia: Secondary | ICD-10-CM

## 2020-03-17 DIAGNOSIS — R5383 Other fatigue: Secondary | ICD-10-CM

## 2020-03-17 DIAGNOSIS — Z8719 Personal history of other diseases of the digestive system: Secondary | ICD-10-CM

## 2020-03-17 DIAGNOSIS — Z862 Personal history of diseases of the blood and blood-forming organs and certain disorders involving the immune mechanism: Secondary | ICD-10-CM

## 2020-03-17 DIAGNOSIS — Z8669 Personal history of other diseases of the nervous system and sense organs: Secondary | ICD-10-CM

## 2020-03-17 DIAGNOSIS — M5030 Other cervical disc degeneration, unspecified cervical region: Secondary | ICD-10-CM

## 2020-03-17 DIAGNOSIS — Z8679 Personal history of other diseases of the circulatory system: Secondary | ICD-10-CM

## 2020-03-17 DIAGNOSIS — M19042 Primary osteoarthritis, left hand: Secondary | ICD-10-CM

## 2020-03-17 DIAGNOSIS — M7062 Trochanteric bursitis, left hip: Secondary | ICD-10-CM

## 2020-03-17 DIAGNOSIS — M5136 Other intervertebral disc degeneration, lumbar region: Secondary | ICD-10-CM

## 2020-03-24 ENCOUNTER — Encounter: Payer: Self-pay | Admitting: Rheumatology

## 2020-03-24 ENCOUNTER — Telehealth (INDEPENDENT_AMBULATORY_CARE_PROVIDER_SITE_OTHER): Admitting: Rheumatology

## 2020-03-24 ENCOUNTER — Other Ambulatory Visit: Payer: Self-pay

## 2020-03-24 VITALS — Ht 63.0 in

## 2020-03-24 DIAGNOSIS — M67911 Unspecified disorder of synovium and tendon, right shoulder: Secondary | ICD-10-CM | POA: Diagnosis not present

## 2020-03-24 DIAGNOSIS — M5136 Other intervertebral disc degeneration, lumbar region: Secondary | ICD-10-CM

## 2020-03-24 DIAGNOSIS — Z8639 Personal history of other endocrine, nutritional and metabolic disease: Secondary | ICD-10-CM

## 2020-03-24 DIAGNOSIS — Z8669 Personal history of other diseases of the nervous system and sense organs: Secondary | ICD-10-CM

## 2020-03-24 DIAGNOSIS — M8589 Other specified disorders of bone density and structure, multiple sites: Secondary | ICD-10-CM

## 2020-03-24 DIAGNOSIS — R5383 Other fatigue: Secondary | ICD-10-CM

## 2020-03-24 DIAGNOSIS — Z8719 Personal history of other diseases of the digestive system: Secondary | ICD-10-CM

## 2020-03-24 DIAGNOSIS — Z8679 Personal history of other diseases of the circulatory system: Secondary | ICD-10-CM

## 2020-03-24 DIAGNOSIS — M797 Fibromyalgia: Secondary | ICD-10-CM

## 2020-03-24 DIAGNOSIS — M19041 Primary osteoarthritis, right hand: Secondary | ICD-10-CM | POA: Diagnosis not present

## 2020-03-24 DIAGNOSIS — F5101 Primary insomnia: Secondary | ICD-10-CM

## 2020-03-24 DIAGNOSIS — M19042 Primary osteoarthritis, left hand: Secondary | ICD-10-CM

## 2020-03-24 DIAGNOSIS — M7061 Trochanteric bursitis, right hip: Secondary | ICD-10-CM | POA: Diagnosis not present

## 2020-03-24 DIAGNOSIS — M7062 Trochanteric bursitis, left hip: Secondary | ICD-10-CM

## 2020-03-24 DIAGNOSIS — M5030 Other cervical disc degeneration, unspecified cervical region: Secondary | ICD-10-CM

## 2020-03-24 NOTE — Progress Notes (Signed)
Virtual Visit via Telephone Note  I connected with Kimberly Stein on 03/24/20 at  4:00 PM EDT by telephone and verified that I am speaking with the correct person using two identifiers.  Location: Patient: Home  Provider: Clinic  This service was conducted via virtual visit.   The patient was located at home. I was located in my office.  Consent was obtained prior to the virtual visit and is aware of possible charges through their insurance for this visit.  The patient is an established patient.  Dr. Estanislado Pandy, MD conducted the virtual visit and Hazel Sams, PA-C acted as scribe during the service.  Office staff helped with scheduling follow up visits after the service was conducted.   I discussed the limitations, risks, security and privacy concerns of performing an evaluation and management service by telephone and the availability of in person appointments. I also discussed with the patient that there may be a patient responsible charge related to this service. The patient expressed understanding and agreed to proceed.  CC: Chronic low back pain History of Present Illness: Patient is a 64 year old female with a past medical history of fibromyalgia, osteoarthritis, and DDD.  She denies any new concerns but has ongoing generalized pain due to fibromyalgia.  She had a flare recently which lasted 2-3 days.  She continues to have chronic lower back pain.  Her last spinal nerve ablation was about 9 months ago prior to Dr. Maryjean Ka moving.  She is awaiting an appointment with the new provider at the practice.  She has reduced the dose of flexeril from 10 mg to 5 mg at bedtime for muscle spasms and insomnia. She continues to have difficulty sleeping at night and has occasional nocturnal pain. She has tried melatonin in the past which was not helpful. She has ongoing pain in her right shoulder joint. She states her left shoulder joint pain has improved.   Review of Systems  Constitutional: Positive for  malaise/fatigue. Negative for fever.  HENT: Negative for congestion.   Eyes: Negative for photophobia, pain, discharge and redness.  Respiratory: Negative for cough, shortness of breath and wheezing.   Cardiovascular: Negative for chest pain, palpitations and leg swelling.  Gastrointestinal: Negative for blood in stool, constipation and diarrhea.  Genitourinary: Negative for dysuria and frequency.  Musculoskeletal: Positive for back pain, joint pain and myalgias. Negative for neck pain.  Skin: Negative for rash.  Neurological: Negative for dizziness and headaches.  Endo/Heme/Allergies: Does not bruise/bleed easily.  Psychiatric/Behavioral: Negative for depression. The patient has insomnia. The patient is not nervous/anxious.       Observations/Objective: Physical Exam Neurological:     Mental Status: She is alert and oriented to person, place, and time.  Psychiatric:        Mood and Affect: Mood and affect normal.        Cognition and Memory: Memory normal.        Judgment: Judgment normal.    Patient reports morning stiffness for 1 hour.   Patient denies nocturnal pain.  Difficulty dressing/grooming: Denies Difficulty climbing stairs: Denies Difficulty getting out of chair: Denies Difficulty using hands for taps, buttons, cutlery, and/or writing: Denies   Assessment and Plan: Visit Diagnoses: Fibromyalgia:  She continues to experience intermittent myalgias and muscle tenderness due to underlying fibromyalgia.  She had a flare recently which lasted about 2 to 3 days and resolved on its own.  Her discomfort due to chicken bursitis has improved recently.  She has been experiencing  occasional discomfort in her right shoulder joint but it has been mild.  She has ongoing fatigue secondary to insomnia.  She has a lot of difficulty sleeping at night due to nocturnal pain.  She has tried melatonin in the past which did not improve her insomnia.  We discussed the importance of regular  exercise and good sleep hygiene.  She plans on continuing to take Flexeril 5 mg a mouth at bedtime as needed for muscle spasms and insomnia.  She will follow-up in the office in 6 months.  Trochanteric bursitis of both hips: Her discomfort has improved.   Primary osteoarthritis of both hands:  She experiences occasional discomfort and stiffness in both hands.  She has not noticed any joint swelling.  Tendinopathy of right rotator cuff: She has intermittent discomfort in her right shoulder joint.   DDD (degenerative disc disease), cervical - s/p fusion:  Chronic pain.  DDD (degenerative disc disease), lumbar - She was previously followed by Dr. Maryjean Ka and would have spinal nerve ablations every 6 months.  Her last ablation was 9 months ago.  She will be establishing care with a new provider at the practice to discuss scheduling a spinal nerve ablation.  Primary insomnia - She has interrupted sleep at night due to nocturnal pain.  She takes Flexeril 5 mg a mouth at bedtime to help with muscle spasms and insomnia.  We discussed the importance of good sleep hygiene.  Other fatigue: Chronic and secondary to insomnia.   Osteopenia of multiple sites: DEXA on 07/19/2018 right femoral neck BMD 0.814 with T score -0.3.  Statistically significant increase in all areas scanned.  Other medical conditions are listed as follows:   History of hyperlipidemia  History of Meniere's disease  History of hypertension  History of hypothyroidism  History of gastroesophageal reflux (GERD)  History of anemia  Follow Up Instructions: She will follow up in 6 months   I discussed the assessment and treatment plan with the patient. The patient was provided an opportunity to ask questions and all were answered. The patient agreed with the plan and demonstrated an understanding of the instructions.   The patient was advised to call back or seek an in-person evaluation if the symptoms worsen or if  the condition fails to improve as anticipated.  I provided 25 minutes of non-face-to-face time during this encounter.  I reviewed the scribed note and attest to the accuracy of the scribed documentation.  Bo Merino, MD  Scribed by Ofilia Neas, PA-C

## 2020-03-27 ENCOUNTER — Other Ambulatory Visit: Payer: Self-pay | Admitting: Family Medicine

## 2020-04-09 ENCOUNTER — Other Ambulatory Visit: Payer: Self-pay | Admitting: Physician Assistant

## 2020-04-09 NOTE — Telephone Encounter (Signed)
Last Visit: 03/24/2020 (telemedicine) Next Visit: due April 2022. Message sent to the front to schedule.   Okay to refill Cyclobenzaprine?

## 2020-04-09 NOTE — Telephone Encounter (Signed)
Please schedule patient for a follow up visit. Patient due April 2022. Thanks!

## 2020-04-19 ENCOUNTER — Other Ambulatory Visit: Payer: Self-pay | Admitting: Gastroenterology

## 2020-04-23 ENCOUNTER — Other Ambulatory Visit: Payer: Self-pay

## 2020-04-23 ENCOUNTER — Ambulatory Visit (INDEPENDENT_AMBULATORY_CARE_PROVIDER_SITE_OTHER): Admitting: Cardiovascular Disease

## 2020-04-23 ENCOUNTER — Encounter: Payer: Self-pay | Admitting: Cardiovascular Disease

## 2020-04-23 VITALS — BP 122/74 | HR 86 | Temp 98.7°F | Ht 63.0 in | Wt 152.8 lb

## 2020-04-23 DIAGNOSIS — I10 Essential (primary) hypertension: Secondary | ICD-10-CM | POA: Diagnosis not present

## 2020-04-23 DIAGNOSIS — E7800 Pure hypercholesterolemia, unspecified: Secondary | ICD-10-CM

## 2020-04-23 NOTE — Progress Notes (Signed)
Cardiology Office Note   Evaluation Performed:  Follow-up visit  Date:  04/23/2020   ID:  Kimberly, Stein May 22, 1956, MRN [id number]  PCP:  Eulas Post, MD  Cardiologist:  Skeet Latch, MD  Electrophysiologist:  None   Chief Complaint:  hyperlipidemia  History of Present Illness:    Kimberly Stein is a 64 y.o. female with hypertension, hyperlipidemia, hypertriglyceridemia, and tobacco abuse who presents for follow.  [name] was seen on 01/2015 after she had an abnormal stress test.  She underwent LHC on 02/25/15, which revealed no coronary artery disease.  She followed up with Kimberly Stein 04/2015 due to myalgias on atorvastatin.  Given her lack of ASCVD she was not a candidate for PCSK9 inhibitors.  However, she was willing to retry rosuvastatin.  She developed myalgias and had to stop it.  Her triglycerides were over 700 so she was started on fenofibrate.    [name] continues to work with Dr. Elease Hashimoto on her lipids. She had lipids checked 07/2016 and triglycerides were much better.  However, her LDL remained elevated.  He recommended starting atorvastatin and she picked up the prescription but didn't start it.  She was eating a lot of cheese and salami in order to avoid foods that would exacerbate her Celiac disease.  Lately she has been doing well.  She struggles with pain in her shoulder and lower back.  She walks a couple times per day for 1 to 1.5 miles.  She can't walk as fast due to pain.  She has no chest pain or shortness of breath.  She denies LE edema, orthopnea or PND.  She continues to smoke 1 ppd and isn't ready to quit.  She thinks that she is too anxious.  She is frustrated that she can't get flexeril anymore after she turns 65.  She tried Zetia and has myalgias similar to statins.  She has stopped eating red meat and limited cheese.     Past Medical History:  Diagnosis Date  . Allergy    sinus infections  . Anemia   . Arthritis   . Celiac  disease   . Fibromyalgia   . GERD (gastroesophageal reflux disease)   . Heart murmur   . Hiatal hernia   . Hyperlipidemia   . Hypertension   . Meniere's disease   . Neuromuscular disorder (HCC)    fibromyalgia  . Thyroid disease    hypothyroid  . 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 CARDIAC CATHETERIZATION N/A 02/25/2015   Procedure: Left Heart Cath and Coronary Angiography;  Surgeon: Peter M Martinique, MD;  Location: [facility];  Service: Cardiovascular;  Laterality: N/A;  . CERVICAL DISC SURGERY    . CHOLECYSTECTOMY    . COLONOSCOPY    . DILATION AND CURETTAGE OF UTERUS    . HAND SURGERY Right    right hand; calcium deposit on wrist and under thumb and ganglion cyst and carpal tunnel  . lumbar radiofrequency nerve ablation     twice yearly  . MOUTH SURGERY  2016   couple times due to gum disease  . ROTATOR CUFF REPAIR Left 06/07/2015   left shoulder  . TOTAL ABDOMINAL HYSTERECTOMY    . UPPER GASTROINTESTINAL ENDOSCOPY       Current Meds  Medication Sig  . albuterol (VENTOLIN HFA) 108 (90 Base) MCG/ACT inhaler INHALE 2 PUFFS INTO THE LUNGS TWICE DAILY  . Calcium Carb-Cholecalciferol (CALCIUM  600 + D PO) Take by mouth 2 (two) times daily.  . chlorthalidone (HYGROTON) 25 MG tablet Take 1 tablet (25 mg total) by mouth daily.  . cyclobenzaprine (FLEXERIL) 5 MG tablet TAKE 1 TABLET(5 MG) BY MOUTH AT BEDTIME AS NEEDED FOR MUSCLE SPASMS  . fenofibrate (TRICOR) 145 MG tablet TAKE 1 TABLET(145 MG) BY MOUTH DAILY  . levothyroxine (SYNTHROID) 150 MCG tablet TAKE 1 TABLET(150 MCG) BY MOUTH DAILY BEFORE BREAKFAST  . losartan (COZAAR) 50 MG tablet TAKE 1 TABLET(50 MG) BY MOUTH DAILY  . omeprazole (PRILOSEC) 40 MG capsule TAKE 1 CAPSULE BY MOUTH EVERY DAY 30 TO 60 MINUTES BEFORE DINNER  . potassium chloride (KLOR-CON M10) 10 MEQ tablet Take 1 tablet (10 mEq total) by mouth daily.  . PSYLLIUM HUSK PO Take by mouth. Take 3 pills  daily  . [DISCONTINUED] amoxicillin-clavulanate (AUGMENTIN) 875-125 MG tablet Take 1 tablet by mouth 2 (two) times daily.  . [DISCONTINUED] ezetimibe (ZETIA) 10 MG tablet TAKE 1 TABLET(10 MG) BY MOUTH DAILY  . [DISCONTINUED] oxycodone (OXY-IR) 5 MG capsule Take 1 capsule (5 mg total) by mouth every 6 (six) hours as needed.     Allergies:   Crestor [rosuvastatin], Lipitor [atorvastatin calcium], Nsaids, Nortriptyline, Nortriptyline hcl, and Vicodin [hydrocodone-acetaminophen]   Social History   Tobacco Use  . Smoking status: Current Every Day Smoker    Packs/day: 1.00    Years: 46.00    Pack years: 46.00    Types: Cigarettes  . Smokeless tobacco: Never Used  Vaping Use  . Vaping Use: Never used  Substance Use Topics  . Alcohol use: No    Alcohol/week: 0.0 standard drinks  . Drug use: No     Family Hx: The patient's family history includes Breast cancer in her maternal aunt; Celiac disease in her mother and sister; Diabetes in her mother, sister, and sister; Heart disease in her mother; Meniere's disease in her sister; Prostate cancer in her father. There is no history of Colon cancer, Esophageal cancer, Stomach cancer, Rectal cancer, Liver cancer, or Pancreatic cancer.  ROS:   Please see the history of present illness.     All other systems reviewed and are negative.   Prior CV studies:   The following studies were reviewed today:  LHC 02/25/15:    Dominance: Right   Left Main  The vessel was injected is normal in caliber is angiographically normal.     Left Anterior Descending  The vessel was injected is normal in caliber . The vessel is tortuous. Minimal calcification in the proximal vessel.     Left Circumflex  The vessel was injected is normal in caliber is angiographically normal.     Right Coronary Artery  The vessel was injected is normal in caliber is angiographically normal. Minimal calcification in the proximal vessel.       Labs/Other Tests and  Data Reviewed:    EKG:  No ECG reviewed.  Recent Labs: 08/21/2019: ALT 14; BUN 17; Creatinine, Ser 0.77; Hemoglobin 13.4; Platelets 199.0; Potassium 4.2; Sodium 140; TSH 1.52   Recent Lipid Panel Lab Results  Component Value Date/Time   CHOL 237 (H) 08/21/2019 07:46 AM   TRIG 145.0 08/21/2019 07:46 AM   HDL 62.30 08/21/2019 07:46 AM   CHOLHDL 4 08/21/2019 07:46 AM   LDLCALC 146 (H) 08/21/2019 07:46 AM   LDLDIRECT 168 (H) 02/19/2015 07:55 AM    Wt Readings from Last 3 Encounters:  04/23/20 152 lb 12.8 oz (69.3 kg)  11/26/19 150 lb (68  kg)  09/27/19 153 lb 3.2 oz (69.5 kg)     Objective:    VS:  BP 122/74   Pulse 86   Temp 98.7 F (37.1 C)   Ht 5' 3"  (1.6 m)   Wt 152 lb 12.8 oz (69.3 kg)   SpO2 99%   BMI 27.07 kg/m  , BMI Body mass index is 27.07 kg/m. GENERAL:  Well appearing HEENT: Pupils equal round and reactive, fundi not visualized, oral mucosa unremarkable NECK:  No jugular venous distention, waveform within normal limits, carotid upstroke brisk and symmetric, no bruits LUNGS:  Clear to auscultation bilaterally HEART:  RRR.  PMI not displaced or sustained,S1 and S2 within normal limits, no S3, no S4, no clicks, no rubs, no murmurs ABD:  Flat, positive bowel sounds normal in frequency in pitch, no bruits, no rebound, no guarding, no midline pulsatile mass, no hepatomegaly, no splenomegaly EXT:  2 plus pulses throughout, no edema, no cyanosis no clubbing SKIN:  No rashes no nodules NEURO:  Cranial nerves II through XII grossly intact, motor grossly intact throughout PSYCH:  Cognitively intact, oriented to person place and time   ASSESSMENT & PLAN:    # Hypertension:   Blood pressure was initially elevated but better on repeat.  Continue chlorthalidone and losartan.  # Tobacco abuse: Ms. Lindvall was encouraged to quit smoking.  We have previously tried Chantix and patches.  If she gets a screening chest CT this will also allow [place] to look for coronary  calcification.  # Hyperlipidemia: # Hypertriglyceridemia: Triglycerides are much better since adding fenofibrate.  LDL remains poorly controlled.  She had no CAD on her cath in 2016.  She is going to get lipids rechecked with her PCP in a month or 2.  She has really changed her diet and wants to see what happens.  She has been intolerant of both statins and Zetia.  If she has coronary calcification on either screening chest CT or calcium score she could be a Candidate for PCSK9 inhibitor.   Medication Adjustments/Labs and Tests Ordered: Current medicines are reviewed at length with the patient today.  Concerns regarding medicines are outlined above.   Tests Ordered: No orders of the defined types were placed in this encounter.   Medication Changes: No orders of the defined types were placed in this encounter.   Disposition:  Follow up in 1 year(s)  Signed, Skeet Latch, MD  04/23/2020 8:30 AM    [place]

## 2020-04-23 NOTE — Patient Instructions (Signed)
Medication Instructions:  Your physician recommends that you continue on your current medications as directed. Please refer to the Current Medication list given to you today.  *If you need a refill on your cardiac medications before your next appointment, please call your pharmacy*  Lab Work: HAVE DR Gumlog TO DR Antlers  Testing/Procedures: NONE  Follow-Up: At [facility], you and your health needs are our priority.  As part of our continuing mission to provide you with exceptional heart care, we have created designated Provider Care Teams.  These Care Teams include your primary Cardiologist (physician) and Advanced Practice Providers (APPs -  Physician Assistants and Nurse Practitioners) who all work together to provide you with the care you need, when you need it.  We recommend signing up for the patient portal called "MyChart".  Sign up information is provided on this After Visit Summary.  MyChart is used to connect with patients for Virtual Visits (Telemedicine).  Patients are able to view lab/test results, encounter notes, upcoming appointments, etc.  Non-urgent messages can be sent to your provider as well.   To learn more about what you can do with MyChart, go to [email or web address].    Your next appointment:   12 month(s)  You will receive a reminder letter in the mail two months in advance. If you don't receive a letter, please call our office to schedule the follow-up appointment.  The format for your next appointment:   In Person  Provider:   You may see Skeet Latch, MD or one of the following Advanced Practice Providers on your designated Care Team:    Kerin Ransom, PA-C  [place], [place]  Coletta Memos, [place]

## 2020-05-04 ENCOUNTER — Other Ambulatory Visit: Payer: Self-pay | Admitting: Family Medicine

## 2020-05-06 ENCOUNTER — Telehealth: Payer: Self-pay

## 2020-05-06 ENCOUNTER — Other Ambulatory Visit: Payer: Self-pay | Admitting: Physician Assistant

## 2020-05-06 NOTE — Telephone Encounter (Signed)
Last Visit: 03/24/2020 (telemedicine) Next Visit: 09/03/2020  Last Fill: 04/09/2020  Okay to refill Cyclobenzaprine?

## 2020-05-13 NOTE — Telephone Encounter (Signed)
Opened in error

## 2020-06-03 ENCOUNTER — Other Ambulatory Visit: Payer: Self-pay | Admitting: Physician Assistant

## 2020-06-03 NOTE — Telephone Encounter (Signed)
Last Visit: 03/24/2020 Next Visit: 09/03/2020  Current Dose per office note on 03/24/2020, Flexeril 5 mg a mouth at bedtime to help with muscle spasms and insomnia Dx: Primary insomnia  Okay to refill Flexeril?

## 2020-06-29 ENCOUNTER — Other Ambulatory Visit: Payer: Self-pay | Admitting: Family Medicine

## 2020-07-01 ENCOUNTER — Other Ambulatory Visit: Payer: Self-pay | Admitting: Physician Assistant

## 2020-07-01 NOTE — Telephone Encounter (Signed)
Last Visit: 03/24/2020 Next Visit: 09/03/2020 Current Dose per office note on 03/24/2020, Flexeril 5 mg a mouth at bedtime to help with muscle spasms and insomnia Dx:   Primary insomnia Last Fill:06/03/2020  Okay to refill Flexeril?

## 2020-07-25 ENCOUNTER — Other Ambulatory Visit: Payer: Self-pay | Admitting: Physician Assistant

## 2020-07-27 NOTE — Telephone Encounter (Signed)
Last Visit: 03/24/2020 Next Visit: 09/03/2020  Current Dose per office note on 03/24/2020, Flexeril 5 mg a mouth at bedtime as needed for muscle spasms and insomnia Dx: Fibromyalgia  Last Fill:07/01/2020  Okay to refill flexeril?

## 2020-08-02 ENCOUNTER — Other Ambulatory Visit: Payer: Self-pay | Admitting: Family Medicine

## 2020-08-07 ENCOUNTER — Other Ambulatory Visit: Payer: Self-pay | Admitting: Family Medicine

## 2020-08-08 ENCOUNTER — Other Ambulatory Visit: Payer: Self-pay | Admitting: Cardiovascular Disease

## 2020-08-08 ENCOUNTER — Other Ambulatory Visit: Payer: Self-pay | Admitting: Family Medicine

## 2020-08-20 NOTE — Progress Notes (Deleted)
Office Visit Note  Patient: Kimberly Stein             Date of Birth: 05-Jun-1955           MRN: [id number]             PCP: Eulas Post, MD Referring: Eulas Post, MD Visit Date: 09/03/2020 Occupation: @GUAROCC @  Subjective:  No chief complaint on file.   History of Present Illness: Kimberly Stein is a 64 y.o. female ***   Activities of Daily Living:  Patient reports morning stiffness for *** {minute/hour:19697}.   Patient {ACTIONS;DENIES/REPORTS:21021675::"Denies"} nocturnal pain.  Difficulty dressing/grooming: {ACTIONS;DENIES/REPORTS:21021675::"Denies"} Difficulty climbing stairs: {ACTIONS;DENIES/REPORTS:21021675::"Denies"} Difficulty getting out of chair: {ACTIONS;DENIES/REPORTS:21021675::"Denies"} Difficulty using hands for taps, buttons, cutlery, and/or writing: {ACTIONS;DENIES/REPORTS:21021675::"Denies"}  No Rheumatology ROS completed.   PMFS History:  Patient Active Problem List   Diagnosis Date Noted  . Low back pain 11/26/2019  . Impingement syndrome of right shoulder 11/26/2019  . Belching 04/17/2019  . Screening for viral disease 04/17/2019  . Other fatigue 06/13/2016  . Primary insomnia 06/13/2016  . Primary osteoarthritis of both hands 06/13/2016  . Trochanteric bursitis of both hips 06/13/2016  . DDD cervical spine status post fusion 06/13/2016  . Facet arthritis, degenerative, lumbar spine 06/13/2016  . Tendinopathy of right rotator cuff 06/13/2016  . Chest pain, atypical 02/25/2015  . Abnormal nuclear stress test 02/25/2015  . Hyperlipidemia 07/17/2013  . Osteopenia 01/28/2012  . 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 CELIAC SPRUE 09/29/2009  . Hypothyroidism 09/07/2009  . Meniere's disease 09/07/2009  . Essential hypertension 09/07/2009  . RUQ PAIN 09/07/2009  . Fibromyalgia 06/12/2009  . CERVICALGIA 06/03/2009  . ANEMIA-IRON DEFICIENCY 10/21/2008  . Gastroesophageal reflux disease 10/21/2008  . DIZZINESS 10/21/2008  . COLONIC POLYPS,  HX OF 10/21/2008    Past Medical History:  Diagnosis Date  . Allergy    sinus infections  . Anemia   . Arthritis   . Celiac disease   . Fibromyalgia   . GERD (gastroesophageal reflux disease)   . Heart murmur   . Hiatal hernia   . Hyperlipidemia   . Hypertension   . Meniere's disease   . Neuromuscular disorder (HCC)    fibromyalgia  . Thyroid disease    hypothyroid  . 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 Celiac disease Mother   . Diabetes Mother   . Heart disease Mother   . Prostate cancer Father   . Breast cancer Maternal Aunt   . Diabetes Sister   . Meniere's disease Sister   . Celiac disease Sister   . Diabetes Sister   . Colon cancer Neg Hx   . Esophageal cancer Neg Hx   . Stomach cancer Neg Hx   . Rectal cancer Neg Hx   . Liver cancer Neg Hx   . Pancreatic cancer Neg Hx    Past Surgical History:  Procedure Laterality Date  . CARDIAC CATHETERIZATION N/A 02/25/2015   Procedure: Left Heart Cath and Coronary Angiography;  Surgeon: Peter M Martinique, MD;  Location: [facility];  Service: Cardiovascular;  Laterality: N/A;  . CERVICAL DISC SURGERY    . CHOLECYSTECTOMY    . COLONOSCOPY    . DILATION AND CURETTAGE OF UTERUS    . HAND SURGERY Right    right hand; calcium deposit on wrist and under thumb and ganglion cyst and carpal tunnel  . lumbar radiofrequency nerve ablation  twice yearly  . MOUTH SURGERY  2016   couple times due to gum disease  . ROTATOR CUFF REPAIR Left 06/07/2015   left shoulder  . TOTAL ABDOMINAL HYSTERECTOMY    . UPPER GASTROINTESTINAL ENDOSCOPY     Social History   Social History Narrative  . Not on file   Immunization History  Administered Date(s) Administered  . PFIZER(Purple Top)SARS-COV-2 Vaccination 01/06/2020, 01/31/2020  . Pneumococcal Conjugate-13 07/17/2013  . Tdap 06/26/2013  . Zoster 07/17/2013     Objective: Vital Signs: There were no vitals taken for  this visit.   Physical Exam   Musculoskeletal Exam: ***  CDAI Exam: CDAI Score: -- Patient Global: --; Provider Global: -- Swollen: --; Tender: -- Joint Exam 09/03/2020   No joint exam has been documented for this visit   There is currently no information documented on the homunculus. Go to the Rheumatology activity and complete the homunculus joint exam.  Investigation: No additional findings.  Imaging: No results found.  Recent Labs: Lab Results  Component Value Date   WBC 8.0 08/21/2019   HGB 13.4 08/21/2019   PLT 199.0 08/21/2019   NA 140 08/21/2019   K 4.2 08/21/2019   CL 100 08/21/2019   CO2 32 08/21/2019   GLUCOSE 87 08/21/2019   BUN 17 08/21/2019   CREATININE 0.77 08/21/2019   BILITOT 0.4 08/21/2019   ALKPHOS 26 (L) 08/21/2019   AST 16 08/21/2019   ALT 14 08/21/2019   PROT 7.1 08/21/2019   ALBUMIN 4.5 08/21/2019   CALCIUM 9.8 08/21/2019   GFRAA 85 12/12/2016    Speciality Comments: No specialty comments available.  Procedures:  No procedures performed Allergies: Crestor [rosuvastatin], Lipitor [atorvastatin calcium], Nsaids, Nortriptyline, Nortriptyline hcl, and Vicodin [hydrocodone-acetaminophen]   Assessment / Plan:     Visit Diagnoses: No diagnosis found.  Orders: No orders of the defined types were placed in this encounter.  No orders of the defined types were placed in this encounter.   Face-to-face time spent with patient was *** minutes. Greater than 50% of time was spent in counseling and coordination of care.  Follow-Up Instructions: No follow-ups on file.   Earnestine Mealing, CMA  Note - This record has been created using Editor, commissioning.  Chart creation errors have been sought, but may not always  have been located. Such creation errors do not reflect on  the standard of medical care.

## 2020-09-03 ENCOUNTER — Ambulatory Visit: Admitting: Physician Assistant

## 2020-09-03 DIAGNOSIS — Z8639 Personal history of other endocrine, nutritional and metabolic disease: Secondary | ICD-10-CM

## 2020-09-03 DIAGNOSIS — Z8669 Personal history of other diseases of the nervous system and sense organs: Secondary | ICD-10-CM

## 2020-09-03 DIAGNOSIS — Z8719 Personal history of other diseases of the digestive system: Secondary | ICD-10-CM

## 2020-09-03 DIAGNOSIS — Z8679 Personal history of other diseases of the circulatory system: Secondary | ICD-10-CM

## 2020-09-03 DIAGNOSIS — M8589 Other specified disorders of bone density and structure, multiple sites: Secondary | ICD-10-CM

## 2020-09-03 DIAGNOSIS — M5030 Other cervical disc degeneration, unspecified cervical region: Secondary | ICD-10-CM

## 2020-09-03 DIAGNOSIS — M19041 Primary osteoarthritis, right hand: Secondary | ICD-10-CM

## 2020-09-03 DIAGNOSIS — F5101 Primary insomnia: Secondary | ICD-10-CM

## 2020-09-03 DIAGNOSIS — M67911 Unspecified disorder of synovium and tendon, right shoulder: Secondary | ICD-10-CM

## 2020-09-03 DIAGNOSIS — M5136 Other intervertebral disc degeneration, lumbar region: Secondary | ICD-10-CM

## 2020-09-03 DIAGNOSIS — Z862 Personal history of diseases of the blood and blood-forming organs and certain disorders involving the immune mechanism: Secondary | ICD-10-CM

## 2020-09-03 DIAGNOSIS — R5383 Other fatigue: Secondary | ICD-10-CM

## 2020-09-03 DIAGNOSIS — M7061 Trochanteric bursitis, right hip: Secondary | ICD-10-CM

## 2020-09-03 DIAGNOSIS — M797 Fibromyalgia: Secondary | ICD-10-CM

## 2020-09-08 NOTE — Progress Notes (Deleted)
Office Visit Note  Patient: Kimberly Stein             Date of Birth: 1955-06-11           MRN: [id number]             PCP: Eulas Post, MD Referring: Eulas Post, MD Visit Date: 09/22/2020 Occupation: @GUAROCC @  Subjective:  No chief complaint on file.   History of Present Illness: Kimberly Stein is a 65 y.o. female ***   Activities of Daily Living:  Patient reports morning stiffness for *** {minute/hour:19697}.   Patient {ACTIONS;DENIES/REPORTS:21021675::"Denies"} nocturnal pain.  Difficulty dressing/grooming: {ACTIONS;DENIES/REPORTS:21021675::"Denies"} Difficulty climbing stairs: {ACTIONS;DENIES/REPORTS:21021675::"Denies"} Difficulty getting out of chair: {ACTIONS;DENIES/REPORTS:21021675::"Denies"} Difficulty using hands for taps, buttons, cutlery, and/or writing: {ACTIONS;DENIES/REPORTS:21021675::"Denies"}  No Rheumatology ROS completed.   PMFS History:  Patient Active Problem List   Diagnosis Date Noted  . Low back pain 11/26/2019  . Impingement syndrome of right shoulder 11/26/2019  . Belching 04/17/2019  . Screening for viral disease 04/17/2019  . Other fatigue 06/13/2016  . Primary insomnia 06/13/2016  . Primary osteoarthritis of both hands 06/13/2016  . Trochanteric bursitis of both hips 06/13/2016  . DDD cervical spine status post fusion 06/13/2016  . Facet arthritis, degenerative, lumbar spine 06/13/2016  . Tendinopathy of right rotator cuff 06/13/2016  . Chest pain, atypical 02/25/2015  . Abnormal nuclear stress test 02/25/2015  . Hyperlipidemia 07/17/2013  . Osteopenia 01/28/2012  . 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 CELIAC SPRUE 09/29/2009  . Hypothyroidism 09/07/2009  . Meniere's disease 09/07/2009  . Essential hypertension 09/07/2009  . RUQ PAIN 09/07/2009  . Fibromyalgia 06/12/2009  . CERVICALGIA 06/03/2009  . ANEMIA-IRON DEFICIENCY 10/21/2008  . Gastroesophageal reflux disease 10/21/2008  . DIZZINESS 10/21/2008  . COLONIC POLYPS,  HX OF 10/21/2008    Past Medical History:  Diagnosis Date  . Allergy    sinus infections  . Anemia   . Arthritis   . Celiac disease   . Fibromyalgia   . GERD (gastroesophageal reflux disease)   . Heart murmur   . Hiatal hernia   . Hyperlipidemia   . Hypertension   . Meniere's disease   . Neuromuscular disorder (HCC)    fibromyalgia  . Thyroid disease    hypothyroid  . 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 Celiac disease Mother   . Diabetes Mother   . Heart disease Mother   . Prostate cancer Father   . Breast cancer Maternal Aunt   . Diabetes Sister   . Meniere's disease Sister   . Celiac disease Sister   . Diabetes Sister   . Colon cancer Neg Hx   . Esophageal cancer Neg Hx   . Stomach cancer Neg Hx   . Rectal cancer Neg Hx   . Liver cancer Neg Hx   . Pancreatic cancer Neg Hx    Past Surgical History:  Procedure Laterality Date  . CARDIAC CATHETERIZATION N/A 02/25/2015   Procedure: Left Heart Cath and Coronary Angiography;  Surgeon: Peter M Martinique, MD;  Location: [facility];  Service: Cardiovascular;  Laterality: N/A;  . CERVICAL DISC SURGERY    . CHOLECYSTECTOMY    . COLONOSCOPY    . DILATION AND CURETTAGE OF UTERUS    . HAND SURGERY Right    right hand; calcium deposit on wrist and under thumb and ganglion cyst and carpal tunnel  . lumbar radiofrequency nerve ablation  twice yearly  . MOUTH SURGERY  2016   couple times due to gum disease  . ROTATOR CUFF REPAIR Left 06/07/2015   left shoulder  . TOTAL ABDOMINAL HYSTERECTOMY    . UPPER GASTROINTESTINAL ENDOSCOPY     Social History   Social History Narrative  . Not on file   Immunization History  Administered Date(s) Administered  . PFIZER(Purple Top)SARS-COV-2 Vaccination 01/06/2020, 01/31/2020  . Pneumococcal Conjugate-13 07/17/2013  . Tdap 06/26/2013  . Zoster 07/17/2013     Objective: Vital Signs: There were no vitals taken for  this visit.   Physical Exam   Musculoskeletal Exam: ***  CDAI Exam: CDAI Score: -- Patient Global: --; Provider Global: -- Swollen: --; Tender: -- Joint Exam 09/22/2020   No joint exam has been documented for this visit   There is currently no information documented on the homunculus. Go to the Rheumatology activity and complete the homunculus joint exam.  Investigation: No additional findings.  Imaging: No results found.  Recent Labs: Lab Results  Component Value Date   WBC 8.0 08/21/2019   HGB 13.4 08/21/2019   PLT 199.0 08/21/2019   NA 140 08/21/2019   K 4.2 08/21/2019   CL 100 08/21/2019   CO2 32 08/21/2019   GLUCOSE 87 08/21/2019   BUN 17 08/21/2019   CREATININE 0.77 08/21/2019   BILITOT 0.4 08/21/2019   ALKPHOS 26 (L) 08/21/2019   AST 16 08/21/2019   ALT 14 08/21/2019   PROT 7.1 08/21/2019   ALBUMIN 4.5 08/21/2019   CALCIUM 9.8 08/21/2019   GFRAA 85 12/12/2016    Speciality Comments: No specialty comments available.  Procedures:  No procedures performed Allergies: Crestor [rosuvastatin], Lipitor [atorvastatin calcium], Nsaids, Nortriptyline, Nortriptyline hcl, and Vicodin [hydrocodone-acetaminophen]   Assessment / Plan:     Visit Diagnoses: No diagnosis found.  Orders: No orders of the defined types were placed in this encounter.  No orders of the defined types were placed in this encounter.   Face-to-face time spent with patient was *** minutes. Greater than 50% of time was spent in counseling and coordination of care.  Follow-Up Instructions: No follow-ups on file.   Earnestine Mealing, CMA  Note - This record has been created using Editor, commissioning.  Chart creation errors have been sought, but may not always  have been located. Such creation errors do not reflect on  the standard of medical care.

## 2020-09-21 ENCOUNTER — Telehealth: Payer: Self-pay

## 2020-09-21 NOTE — Telephone Encounter (Signed)
I called patient, patient rescheduled 10/20/2020

## 2020-09-21 NOTE — Telephone Encounter (Signed)
Patient called stating she is scheduled for a full physical including labwork with her PCP Dr. Elease Hashimoto on Monday, 09/28/20.  Patient states she is scheduled for an appointment with Dr. Estanislado Pandy tomorrow 09/22/20 and is asking if she needs to keep that appointment since she will be able to see results from physical in her chart.  Patient states the only thing that Dr. Estanislado Pandy prescribes for her is Flexeril and she was told she won't prescribe that after she turns 87 which is in July.  Patent states she has a hairdresser appointment today so if someone could call before 12:00 or after 1:30 pm.

## 2020-09-22 ENCOUNTER — Ambulatory Visit: Admitting: Physician Assistant

## 2020-09-22 DIAGNOSIS — M5030 Other cervical disc degeneration, unspecified cervical region: Secondary | ICD-10-CM

## 2020-09-22 DIAGNOSIS — Z8719 Personal history of other diseases of the digestive system: Secondary | ICD-10-CM

## 2020-09-22 DIAGNOSIS — Z862 Personal history of diseases of the blood and blood-forming organs and certain disorders involving the immune mechanism: Secondary | ICD-10-CM

## 2020-09-22 DIAGNOSIS — M797 Fibromyalgia: Secondary | ICD-10-CM

## 2020-09-22 DIAGNOSIS — F5101 Primary insomnia: Secondary | ICD-10-CM

## 2020-09-22 DIAGNOSIS — M19042 Primary osteoarthritis, left hand: Secondary | ICD-10-CM

## 2020-09-22 DIAGNOSIS — M5136 Other intervertebral disc degeneration, lumbar region: Secondary | ICD-10-CM

## 2020-09-22 DIAGNOSIS — Z8669 Personal history of other diseases of the nervous system and sense organs: Secondary | ICD-10-CM

## 2020-09-22 DIAGNOSIS — Z8679 Personal history of other diseases of the circulatory system: Secondary | ICD-10-CM

## 2020-09-22 DIAGNOSIS — M8589 Other specified disorders of bone density and structure, multiple sites: Secondary | ICD-10-CM

## 2020-09-22 DIAGNOSIS — M67911 Unspecified disorder of synovium and tendon, right shoulder: Secondary | ICD-10-CM

## 2020-09-22 DIAGNOSIS — Z8639 Personal history of other endocrine, nutritional and metabolic disease: Secondary | ICD-10-CM

## 2020-09-22 DIAGNOSIS — M7062 Trochanteric bursitis, left hip: Secondary | ICD-10-CM

## 2020-09-22 DIAGNOSIS — R5383 Other fatigue: Secondary | ICD-10-CM

## 2020-09-28 ENCOUNTER — Encounter: Admitting: Family Medicine

## 2020-10-02 ENCOUNTER — Other Ambulatory Visit: Payer: Self-pay | Admitting: Family Medicine

## 2020-10-06 NOTE — Progress Notes (Signed)
Office Visit Note  Patient: Kimberly Stein             Date of Birth: 01-21-1956           MRN: [id number]             PCP: Eulas Post, MD Referring: Eulas Post, MD Visit Date: 10/20/2020 Occupation: @GUAROCC @  Subjective:  Right shoulder joint pain   History of Present Illness: NAO LINZ is a 65 y.o. female with history of fibromyalgia, osteoarthritis, and DDD.  Patient reports that several months ago she was experiencing severe nocturnal cramps in bilateral lower extremities on a nightly basis.  She was waking up several times during the night with muscle cramps.  She tried ingesting mustard throughout the night to help to alleviate her symptoms.  She recently restarted taking magnesium 40 mg daily which has improved her symptoms significantly.  She has been sleeping better at night and her energy level has improved.  She continues to take flexeril 5 mg at bedtime as needed for muscle spasms, which remain effective at managing her fibromyalgia.  She states she continues to have chronic lower back pain but it has been tolerable since having another spinal nerve ablation about 3 months ago.  She has been experiencing increased discomfort in the right shoulder over the past several months. She did not have any injury prior to the onset of symptoms.  She states the discomfort is worse in the evenings and radiating into the muscles in her upper arm and forearm.  She denies any numbness or tingling.  She has good ROM.  She has not seen Dr. Durward Fortes yet.  She has been applying ice which alleviates some of her discomfort.    Activities of Daily Living:  Patient reports morning stiffness for 1-1.5 hours.   Patient Reports nocturnal pain.  Difficulty dressing/grooming: Denies Difficulty climbing stairs: Denies Difficulty getting out of chair: Denies Difficulty using hands for taps, buttons, cutlery, and/or writing: Denies  Review of Systems  Constitutional: Positive for  fatigue.  HENT: Negative for mouth sores, mouth dryness and nose dryness.   Eyes: Negative for pain, itching and dryness.  Respiratory: Negative for shortness of breath and difficulty breathing.   Cardiovascular: Negative for chest pain and palpitations.  Gastrointestinal: Negative for blood in stool, constipation and diarrhea.  Endocrine: Negative for increased urination.  Genitourinary: Negative for difficulty urinating.  Musculoskeletal: Positive for arthralgias, joint pain, joint swelling, myalgias, muscle weakness, morning stiffness, muscle tenderness and myalgias.  Skin: Negative for color change, rash and redness.  Allergic/Immunologic: Negative for susceptible to infections.  Neurological: Negative for dizziness, numbness, headaches, memory loss and weakness.  Hematological: Negative for bruising/bleeding tendency.  Psychiatric/Behavioral: Negative for confusion.    PMFS History:  Patient Active Problem List   Diagnosis Date Noted  . Low back pain 11/26/2019  . Impingement syndrome of right shoulder 11/26/2019  . Belching 04/17/2019  . Screening for viral disease 04/17/2019  . Other fatigue 06/13/2016  . Primary insomnia 06/13/2016  . Primary osteoarthritis of both hands 06/13/2016  . Trochanteric bursitis of both hips 06/13/2016  . DDD cervical spine status post fusion 06/13/2016  . Facet arthritis, degenerative, lumbar spine 06/13/2016  . Tendinopathy of right rotator cuff 06/13/2016  . Chest pain, atypical 02/25/2015  . Abnormal nuclear stress test 02/25/2015  . Hyperlipidemia 07/17/2013  . Osteopenia 01/28/2012  . 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 CELIAC SPRUE 09/29/2009  . Hypothyroidism 09/07/2009  . Meniere's disease  09/07/2009  . Essential hypertension 09/07/2009  . RUQ PAIN 09/07/2009  . Fibromyalgia 06/12/2009  . CERVICALGIA 06/03/2009  . ANEMIA-IRON DEFICIENCY 10/21/2008  . Gastroesophageal reflux disease 10/21/2008  . DIZZINESS 10/21/2008  . COLONIC  POLYPS, HX OF 10/21/2008    Past Medical History:  Diagnosis Date  . Allergy    sinus infections  . Anemia   . Arthritis   . Celiac disease   . Fibromyalgia   . GERD (gastroesophageal reflux disease)   . Heart murmur   . Hiatal hernia   . Hyperlipidemia   . Hypertension   . Meniere's disease   . Neuromuscular disorder (HCC)    fibromyalgia  . Thyroid disease    hypothyroid  . 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 Celiac disease Mother   . Diabetes Mother   . Heart disease Mother   . Prostate cancer Father   . Breast cancer Maternal Aunt   . Diabetes Sister   . Meniere's disease Sister   . Celiac disease Sister   . Diabetes Sister   . Colon cancer Neg Hx   . Esophageal cancer Neg Hx   . Stomach cancer Neg Hx   . Rectal cancer Neg Hx   . Liver cancer Neg Hx   . Pancreatic cancer Neg Hx    Past Surgical History:  Procedure Laterality Date  . CARDIAC CATHETERIZATION N/A 02/25/2015   Procedure: Left Heart Cath and Coronary Angiography;  Surgeon: Peter M Martinique, MD;  Location: [facility];  Service: Cardiovascular;  Laterality: N/A;  . CERVICAL DISC SURGERY    . CHOLECYSTECTOMY    . COLONOSCOPY    . DILATION AND CURETTAGE OF UTERUS    . HAND SURGERY Right    right hand; calcium deposit on wrist and under thumb and ganglion cyst and carpal tunnel  . lumbar radiofrequency nerve ablation     twice yearly  . MOUTH SURGERY  2016   couple times due to gum disease  . ROTATOR CUFF REPAIR Left 06/07/2015   left shoulder  . TOTAL ABDOMINAL HYSTERECTOMY    . UPPER GASTROINTESTINAL ENDOSCOPY     Social History   Social History Narrative  . Not on file   Immunization History  Administered Date(s) Administered  . PFIZER(Purple Top)SARS-COV-2 Vaccination 01/06/2020, 01/31/2020  . Pneumococcal Conjugate-13 07/17/2013  . Tdap 06/26/2013  . Zoster, Live 07/17/2013     Objective: Vital Signs: BP 129/84 (BP  Location: Left Arm, Patient Position: Sitting, Cuff Size: Normal)   Pulse 90   Resp 16   Ht 5' 2.5" (1.588 m)   Wt 155 lb 6.4 oz (70.5 kg)   BMI 27.97 kg/m    Physical Exam Vitals and nursing note reviewed.  Constitutional:      Appearance: She is well-developed.  HENT:     Head: Normocephalic and atraumatic.  Eyes:     Conjunctiva/sclera: Conjunctivae normal.  Cardiovascular:     Rate and Rhythm: Normal rate.  Pulmonary:     Effort: Pulmonary effort is normal.  Abdominal:     Palpations: Abdomen is soft.  Musculoskeletal:     Cervical back: Normal range of motion.  Skin:    General: Skin is warm and dry.     Capillary Refill: Capillary refill takes less than 2 seconds.  Neurological:     Mental Status: She is alert and oriented to person, place, and time.  Psychiatric:  Behavior: Behavior normal.      Musculoskeletal Exam: C-spine slightly limited ROM.  Painful ROM of lumbar spine.  Shoulder joints have good ROM with no discomfort.  No tenderness to palpation over shoulder joints.  Wrist joints, MCPs, PIPs, and DIPs good ROM with no disocomfort.  PIP and DIP thickening consistent with osteoarthritis of both hands.   No tenderness or synovitis of MCP joints.  Hip joints, knee joints, and ankle joints good ROM with no discomfort.  No warmth or effusion of knee joints.  No tenderness or ankle joint swelling.    CDAI Exam: CDAI Score: -- Patient Global: --; Provider Global: -- Swollen: --; Tender: -- Joint Exam 10/20/2020   No joint exam has been documented for this visit   There is currently no information documented on the homunculus. Go to the Rheumatology activity and complete the homunculus joint exam.  Investigation: No additional findings.  Imaging: No results found.  Recent Labs: Lab Results  Component Value Date   WBC 6.7 10/16/2020   HGB 13.4 10/16/2020   PLT 184.0 10/16/2020   NA 138 10/16/2020   K 4.4 10/16/2020   CL 100 10/16/2020   CO2 30  10/16/2020   GLUCOSE 104 (H) 10/16/2020   BUN 18 10/16/2020   CREATININE 0.89 10/16/2020   BILITOT 0.4 10/16/2020   ALKPHOS 37 (L) 10/16/2020   AST 25 10/16/2020   ALT 22 10/16/2020   PROT 7.4 10/16/2020   ALBUMIN 4.7 10/16/2020   CALCIUM 10.8 (H) 10/16/2020   GFRAA 85 12/12/2016    Speciality Comments: No specialty comments available.  Procedures:  No procedures performed Allergies: Crestor [rosuvastatin], Lipitor [atorvastatin calcium], Nsaids, Nortriptyline, Nortriptyline hcl, and Vicodin [hydrocodone-acetaminophen]   Assessment / Plan:     Visit Diagnoses: Fibromyalgia: She experiences occasional myalgias and muscle tenderness due to underlying fibromyalgia.  Several months ago she was experiencing severe nocturnal muscle cramps in bilateral lower extremities.  She was having difficulty sleeping at night due to the frequency of the symptoms.  She restarted taking magnesium 400 mg at bedtime and her symptoms have resolved.  She has been taking flexeril 5 mg at bedtime for muscle spasms and insomnia.  She continues to find flexeril to be effective at managing her pain from fibromyalgia.  She has been walking and stretching on a daily basis for exercise.  We discussed the importance of regular exercise and good sleep hygiene.  She will follow-up in the office in 6 months.  Trochanteric bursitis of both hips: She has no tenderness palpation on examination today.  She was encouraged to continue to perform stretching exercises on a daily basis.  Primary osteoarthritis of both hands: She has PIP and DIP thickening consistent with osteoarthritis of both hands.  No joint tenderness or synovitis was noted.  She was able to make a complete fist bilaterally.  She experiences occasional discomfort and stiffness in both hands.  We discussed the importance of joint protection and muscle strengthening.  She was given a handout of hand exercises to perform.  She was encouraged to use arthritis  compression gloves as needed.  Tendinopathy of right rotator cuff: She continues to have chronic pain in the right shoulder joint.  She has tenderness over the deltoid insertion site of the right shoulder.  She has good range of motion of the right shoulder joint on examination today.  She is planning on following up with Dr. Durward Fortes for further evaluation and management.  DDD (degenerative disc disease), cervical - s/p  fusion: She has limited range of motion with lateral rotation.  No symptoms of radiculopathy at this time.  DDD (degenerative disc disease), lumbar - She was previously followed by Dr. Maryjean Ka and would have spinal nerve ablations every 6 months.  Her most recent ablation was 3 months ago.  Other fatigue: Stable.  We discussed the importance of daily exercise.  Primary insomnia -She takes Flexeril 5 mg a mouth at bedtime to help with muscle spasms and insomnia.  A refill was sent to the pharmacy today.  Osteopenia of multiple sites - DEXA on 07/19/2018 right femoral neck BMD 0.814 with T score -0.3.   Other medical conditions are listed as follows:   History of Meniere's disease  History of hyperlipidemia  History of hypothyroidism  History of hypertension  History of gastroesophageal reflux (GERD)  History of anemia  Orders: No orders of the defined types were placed in this encounter.  No orders of the defined types were placed in this encounter.    Follow-Up Instructions: Return in about 6 months (around 04/21/2021) for Fibromyalgia, Osteoarthritis, DDD.   Ofilia Neas, PA-C  Note - This record has been created using Dragon software.  Chart creation errors have been sought, but may not always  have been located. Such creation errors do not reflect on  the standard of medical care.

## 2020-10-13 ENCOUNTER — Other Ambulatory Visit: Payer: Self-pay

## 2020-10-13 ENCOUNTER — Ambulatory Visit (INDEPENDENT_AMBULATORY_CARE_PROVIDER_SITE_OTHER): Admitting: Family Medicine

## 2020-10-13 ENCOUNTER — Encounter: Payer: Self-pay | Admitting: Family Medicine

## 2020-10-13 VITALS — BP 140/70 | HR 71 | Temp 97.9°F | Ht 63.0 in | Wt 155.9 lb

## 2020-10-13 DIAGNOSIS — Z0000 Encounter for general adult medical examination without abnormal findings: Secondary | ICD-10-CM | POA: Diagnosis not present

## 2020-10-13 NOTE — Patient Instructions (Signed)
Leg Cramps Leg cramps occur when one or more muscles tighten and a person has no control over it (involuntary muscle contraction). Muscle cramps are most common in the calf muscles of the leg. They can occur during exercise or at rest. Leg cramps are painful, and they may last for a few seconds to a few minutes. Cramps may return several times before they finally stop. Usually, leg cramps are not caused by a serious medical problem. In many cases, the cause is not known. Some common causes include:  Excessive physical effort (overexertion), such as during intense exercise.  Doing the same motion over and over.  Staying in a certain position for a long period of time.  Improper preparation, form, or technique while doing a sport or an activity.  Dehydration.  Injury.  Side effects of certain medicines.  Abnormally low levels of minerals in your blood (electrolytes), especially potassium and calcium. This could result from: ? Pregnancy. ? Taking diuretic medicines. Follow these instructions at home: Eating and drinking  Drink enough fluid to keep your urine pale yellow. Staying hydrated may help prevent cramps.  Eat a healthy diet that includes plenty of nutrients to help your muscles function. A healthy diet includes fruits and vegetables, lean protein, whole grains, and low-fat or nonfat dairy products. Managing pain, stiffness, and swelling  Try massaging, stretching, and relaxing the affected muscle. Do this for several minutes at a time.  If directed, put ice on areas that are sore or painful after a cramp. To do this: ? Put ice in a plastic bag. ? Place a towel between your skin and the bag. ? Leave the ice on for 20 minutes, 2-3 times a day. ? Remove the ice if your skin turns bright red. This is very important. If you cannot feel pain, heat, or cold, you have a greater risk of damage to the area.  If directed, apply heat to muscles that are tense or tight. Do this before  you exercise, or as often as told by your health care provider. Use the heat source that your health care provider recommends, such as a moist heat pack or a heating pad. To do this: ? Place a towel between your skin and the heat source. ? Leave the heat on for 20-30 minutes. ? Remove the heat if your skin turns bright red. This is especially important if you are unable to feel pain, heat, or cold. You may have a greater risk of getting burned.  Try taking hot showers or baths to help relax tight muscles.      General instructions  If you are having frequent leg cramps, avoid intense exercise for several days.  Take over-the-counter and prescription medicines only as told by your health care provider.  Keep all follow-up visits. This is important. Contact a health care provider if:  Your leg cramps get more severe or more frequent, or they do not improve over time.  Your foot becomes cold, numb, or blue. Summary  Muscle cramps can develop in any muscle, but the most common place is in the calf muscles of the leg.  Leg cramps are painful, and they may last for a few seconds to a few minutes.  Usually, leg cramps are not caused by a serious medical problem. Often, the cause is not known.  Stay hydrated, and take over-the-counter and prescription medicines only as told by your health care provider. This information is not intended to replace advice given to you by your  health care provider. Make sure you discuss any questions you have with your health care provider. Document Revised: 09/25/2019 Document Reviewed: 09/25/2019 Elsevier Patient Education  [place].  Consider Shingrix vaccine at some point this year  Consider repeat Colonoscopy this year  Set up repeat mammogram.   Consider setting up low dose CT lung cancer screen

## 2020-10-13 NOTE — Progress Notes (Signed)
Established Patient Office Visit  Subjective:  Patient ID: Kimberly Stein, female    DOB: Aug 22, 1955  Age: 65 y.o. MRN: [id number]  CC:  Chief Complaint  Patient presents with  . Annual Exam    Pt complains of leg cramps during the night.     HPI Kimberly Stein presents for physical exam.  Her chronic problems include history of hypertension, celiac disease, GERD, hypothyroidism, Mnire's disease, osteoarthritis of multiple joints, fibromyalgia, hyperlipidemia, ongoing nicotine use.  She had been on Tricor for her triglycerides but apparently had some myalgias and stopped this on her own.  She is unfortunate still smoking a pack cigarettes per day.  Previous referral to low-dose CT lung cancer screening program but she never went.  She is interested in considering going back again.    She has had frequent leg cramps.  She started her self on magnesium 400 mg daily a few weeks ago and her leg cramps have improved some.  She admits that she does not drink a lot of water.  Health maintenance reviewed:  -Previous hysterectomy for benign disease so no further Pap smears recommended -She declines flu vaccines -Previous hepatitis C screen negative -Due for repeat colonoscopy and she plans to set this up her self -Tetanus due 2025 -Colonoscopy due 4/22.  She initially stated that she wished to wait for 5-year interval but then stated she would set up follow-up with GI -COVID vaccines up-to-date.  She did not have date of latest booster -Had previous Zostavax but no history of Shingrix  History-strong family history of type 2 diabetes in mother and 2 sisters.  Her mother had celiac disease and also history of heart disease.  Father had prostate cancer.  She had a brother also with history of diabetes and heart disease  Social history-she is married.  Her husband is age 56 and extremely active and doing well.  Chinara still smokes about a pack cigarettes per day.  No regular alcohol  use.  Wt Readings from Last 3 Encounters:  10/13/20 155 lb 14.4 oz (70.7 kg)  04/23/20 152 lb 12.8 oz (69.3 kg)  11/26/19 150 lb (68 kg)     Past Medical History:  Diagnosis Date  . Allergy    sinus infections  . Anemia   . Arthritis   . Celiac disease   . Fibromyalgia   . GERD (gastroesophageal reflux disease)   . Heart murmur   . Hiatal hernia   . Hyperlipidemia   . Hypertension   . Meniere's disease   . Neuromuscular disorder (HCC)    fibromyalgia  . Thyroid disease    hypothyroid  . 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 CARDIAC CATHETERIZATION N/A 02/25/2015   Procedure: Left Heart Cath and Coronary Angiography;  Surgeon: Peter M Martinique, MD;  Location: [facility];  Service: Cardiovascular;  Laterality: N/A;  . CERVICAL DISC SURGERY    . CHOLECYSTECTOMY    . COLONOSCOPY    . DILATION AND CURETTAGE OF UTERUS    . HAND SURGERY Right    right hand; calcium deposit on wrist and under thumb and ganglion cyst and carpal tunnel  . lumbar radiofrequency nerve ablation     twice yearly  . MOUTH SURGERY  2016   couple times due to gum disease  . ROTATOR CUFF REPAIR Left 06/07/2015   left shoulder  . TOTAL ABDOMINAL HYSTERECTOMY    . UPPER GASTROINTESTINAL  ENDOSCOPY      Family History  Problem Relation Age of Onset  . Celiac disease Mother   . Diabetes Mother   . Heart disease Mother   . Prostate cancer Father   . Breast cancer Maternal Aunt   . Diabetes Sister   . Meniere's disease Sister   . Celiac disease Sister   . Diabetes Sister   . Colon cancer Neg Hx   . Esophageal cancer Neg Hx   . Stomach cancer Neg Hx   . Rectal cancer Neg Hx   . Liver cancer Neg Hx   . Pancreatic cancer Neg Hx     Social History   Socioeconomic History  . Marital status: Married    Spouse name: Not on file  . Number of children: 0  . Years of education: Not on file  . Highest education level: Not on file   Occupational History  . Occupation: Housewife  Tobacco Use  . Smoking status: Current Every Day Smoker    Packs/day: 1.00    Years: 46.00    Pack years: 46.00    Types: Cigarettes  . Smokeless tobacco: Never Used  Vaping Use  . Vaping Use: Never used  Substance and Sexual Activity  . Alcohol use: No    Alcohol/week: 0.0 standard drinks  . Drug use: No  . Sexual activity: Not on file  Other Topics Concern  . Not on file  Social History Narrative  . Not on file   Social Determinants of Health   Financial Resource Strain: Not on file  Food Insecurity: Not on file  Transportation Needs: Not on file  Physical Activity: Not on file  Stress: Not on file  Social Connections: Not on file  Intimate Partner Violence: Not on file    Outpatient Medications Prior to Visit  Medication Sig Dispense Refill  . albuterol (VENTOLIN HFA) 108 (90 Base) MCG/ACT inhaler INHALE 2 PUFFS INTO THE LUNGS TWICE DAILY 18 g 1  . Calcium Carb-Cholecalciferol (CALCIUM 600 + D PO) Take by mouth 2 (two) times daily.    . chlorthalidone (HYGROTON) 25 MG tablet TAKE 1 TABLET(25 MG) BY MOUTH DAILY 90 tablet 0  . cyclobenzaprine (FLEXERIL) 5 MG tablet TAKE 1 TABLET(5 MG) BY MOUTH AT BEDTIME AS NEEDED FOR MUSCLE SPASMS 30 tablet 2  . levothyroxine (SYNTHROID) 150 MCG tablet TAKE 1 TABLET(150 MCG) BY MOUTH DAILY BEFORE AND BREAKFAST 90 tablet 0  . losartan (COZAAR) 50 MG tablet TAKE 1 TABLET(50 MG) BY MOUTH DAILY 90 tablet 1  . omeprazole (PRILOSEC) 40 MG capsule TAKE 1 CAPSULE BY MOUTH EVERY DAY 30 TO 60 MINUTES BEFORE DINNER 90 capsule 1  . potassium chloride (KLOR-CON M10) 10 MEQ tablet Take 1 tablet (10 mEq total) by mouth daily. 90 tablet 3  . PSYLLIUM HUSK PO Take by mouth. Take 3 pills daily    . fenofibrate (TRICOR) 145 MG tablet TAKE 1 TABLET(145 MG) BY MOUTH DAILY 90 tablet 2   No facility-administered medications prior to visit.    Allergies  Allergen Reactions  . Crestor [Rosuvastatin] Other  (See Comments)    Bilateral Leg pain  . Lipitor [Atorvastatin Calcium]     Muscle aches  . Nsaids   . Nortriptyline Rash  . Nortriptyline Hcl Rash    Red rash  . Vicodin [Hydrocodone-Acetaminophen] Itching    ROS Review of Systems  Constitutional: Negative for appetite change, fatigue and unexpected weight change.  Eyes: Negative for visual disturbance.  Respiratory: Negative for cough,  chest tightness, shortness of breath and wheezing.   Cardiovascular: Negative for chest pain, palpitations and leg swelling.  Gastrointestinal: Negative for abdominal pain.  Endocrine: Negative for polydipsia and polyuria.  Genitourinary: Negative for dysuria.  Musculoskeletal: Positive for arthralgias.  Neurological: Negative for dizziness, seizures, syncope, weakness, light-headedness and headaches.      Objective:    Physical Exam Constitutional:      Appearance: She is well-developed.  HENT:     Head: Normocephalic and atraumatic.  Eyes:     Pupils: Pupils are equal, round, and reactive to light.  Neck:     Thyroid: No thyromegaly.  Cardiovascular:     Rate and Rhythm: Normal rate and regular rhythm.     Heart sounds: Normal heart sounds. No murmur heard.   Pulmonary:     Effort: No respiratory distress.     Breath sounds: Normal breath sounds. No wheezing or rales.  Abdominal:     General: Bowel sounds are normal. There is no distension.     Palpations: Abdomen is soft. There is no mass.     Tenderness: There is no abdominal tenderness. There is no guarding or rebound.  Musculoskeletal:        General: Normal range of motion.     Cervical back: Normal range of motion and neck supple.  Lymphadenopathy:     Cervical: No cervical adenopathy.  Skin:    Findings: No rash.  Neurological:     Mental Status: She is alert and oriented to person, place, and time.     Cranial Nerves: No cranial nerve deficit.     Deep Tendon Reflexes: Reflexes normal.  Psychiatric:         Behavior: Behavior normal.        Thought Content: Thought content normal.        Judgment: Judgment normal.     BP 140/70 (BP Location: Left Arm, Patient Position: Sitting, Cuff Size: Normal)   Pulse 71   Temp 97.9 F (36.6 C) (Oral)   Ht 5' 3"  (1.6 m)   Wt 155 lb 14.4 oz (70.7 kg)   SpO2 98%   BMI 27.62 kg/m  Wt Readings from Last 3 Encounters:  10/13/20 155 lb 14.4 oz (70.7 kg)  04/23/20 152 lb 12.8 oz (69.3 kg)  11/26/19 150 lb (68 kg)     Health Maintenance Due  Topic Date Due  . HIV Screening  Never done  . COVID-19 Vaccine (3 - Pfizer risk 4-dose series) 02/28/2020  . COLONOSCOPY (Pts 45-93yr Insurance coverage will need to be confirmed)  09/13/2020    There are no preventive care reminders to display for this patient.  Lab Results  Component Value Date   TSH 1.52 08/21/2019   Lab Results  Component Value Date   WBC 8.0 08/21/2019   HGB 13.4 08/21/2019   HCT 38.9 08/21/2019   MCV 95.7 08/21/2019   PLT 199.0 08/21/2019   Lab Results  Component Value Date   NA 140 08/21/2019   K 4.2 08/21/2019   CO2 32 08/21/2019   GLUCOSE 87 08/21/2019   BUN 17 08/21/2019   CREATININE 0.77 08/21/2019   BILITOT 0.4 08/21/2019   ALKPHOS 26 (L) 08/21/2019   AST 16 08/21/2019   ALT 14 08/21/2019   PROT 7.1 08/21/2019   ALBUMIN 4.5 08/21/2019   CALCIUM 9.8 08/21/2019   GFR 75.55 08/21/2019   Lab Results  Component Value Date   CHOL 237 (H) 08/21/2019   Lab Results  Component  Value Date   HDL 62.30 08/21/2019   Lab Results  Component Value Date   LDLCALC 146 (H) 08/21/2019   Lab Results  Component Value Date   TRIG 145.0 08/21/2019   Lab Results  Component Value Date   CHOLHDL 4 08/21/2019   Lab Results  Component Value Date   HGBA1C 5.8 02/29/2016      Assessment & Plan:   Problem List Items Addressed This Visit   None   Visit Diagnoses    Physical exam    -  Primary   Relevant Orders   Basic metabolic panel   Lipid panel   CBC with  Differential/Platelet   TSH   Hepatic function panel    Patient has multiple chronic problems as above.  Ongoing nicotine use.  We discussed several health maintenance issues as below  -She declines flu vaccine -She is due for repeat colonoscopy and is encouraged to set that up.  She is somewhat equivocal but thinks she will probably do that later this year -Set up follow-up fasting labs -We discussed Shingrix vaccine and she will consider later this year getting that -She is encouraged to consider low-dose CT lung cancer screen.  She has been referred previously and she plans to set that up -Continue with at least every other year mammogram.  She is in process of setting that up -Strongly encouraged to stop smoking.  Current motivation relatively low. -Recommend repeat DEXA by next year -Recommend regular weightbearing exercise and daily calcium and vitamin D  No orders of the defined types were placed in this encounter.   Follow-up: No follow-ups on file.    [name], MD

## 2020-10-16 ENCOUNTER — Other Ambulatory Visit: Payer: Self-pay

## 2020-10-16 ENCOUNTER — Other Ambulatory Visit (INDEPENDENT_AMBULATORY_CARE_PROVIDER_SITE_OTHER)

## 2020-10-16 ENCOUNTER — Other Ambulatory Visit

## 2020-10-16 DIAGNOSIS — Z0000 Encounter for general adult medical examination without abnormal findings: Secondary | ICD-10-CM | POA: Diagnosis not present

## 2020-10-16 LAB — HEPATIC FUNCTION PANEL
ALT: 22 U/L (ref 0–35)
AST: 25 U/L (ref 0–37)
Albumin: 4.7 g/dL (ref 3.5–5.2)
Alkaline Phosphatase: 37 U/L — ABNORMAL LOW (ref 39–117)
Bilirubin, Direct: 0.1 mg/dL (ref 0.0–0.3)
Total Bilirubin: 0.4 mg/dL (ref 0.2–1.2)
Total Protein: 7.4 g/dL (ref 6.0–8.3)

## 2020-10-16 LAB — CBC WITH DIFFERENTIAL/PLATELET
Basophils Absolute: 0 10*3/uL (ref 0.0–0.1)
Basophils Relative: 0.6 % (ref 0.0–3.0)
Eosinophils Absolute: 0.1 10*3/uL (ref 0.0–0.7)
Eosinophils Relative: 1.5 % (ref 0.0–5.0)
HCT: 38.9 % (ref 36.0–46.0)
Hemoglobin: 13.4 g/dL (ref 12.0–15.0)
Lymphocytes Relative: 43.6 % (ref 12.0–46.0)
Lymphs Abs: 2.9 10*3/uL (ref 0.7–4.0)
MCHC: 34.5 g/dL (ref 30.0–36.0)
MCV: 93.9 fl (ref 78.0–100.0)
Monocytes Absolute: 0.3 10*3/uL (ref 0.1–1.0)
Monocytes Relative: 5 % (ref 3.0–12.0)
Neutro Abs: 3.3 10*3/uL (ref 1.4–7.7)
Neutrophils Relative %: 49.3 % (ref 43.0–77.0)
Platelets: 184 10*3/uL (ref 150.0–400.0)
RBC: 4.14 Mil/uL (ref 3.87–5.11)
RDW: 13.9 % (ref 11.5–15.5)
WBC: 6.7 10*3/uL (ref 4.0–10.5)

## 2020-10-16 LAB — BASIC METABOLIC PANEL
BUN: 18 mg/dL (ref 6–23)
CO2: 30 mEq/L (ref 19–32)
Calcium: 10.8 mg/dL — ABNORMAL HIGH (ref 8.4–10.5)
Chloride: 100 mEq/L (ref 96–112)
Creatinine, Ser: 0.89 mg/dL (ref 0.40–1.20)
GFR: 68.32 mL/min (ref >60.00)
Glucose, Bld: 104 mg/dL — ABNORMAL HIGH (ref 70–99)
Potassium: 4.4 mEq/L (ref 3.5–5.1)
Sodium: 138 mEq/L (ref 135–145)

## 2020-10-16 LAB — LIPID PANEL
Cholesterol: 232 mg/dL — ABNORMAL HIGH (ref 0–200)
HDL: 59.4 mg/dL (ref >39.00)
LDL Cholesterol: 143 mg/dL — ABNORMAL HIGH (ref 0–99)
NonHDL: 172.21
Total CHOL/HDL Ratio: 4
Triglycerides: 146 mg/dL (ref 0.0–149.0)
VLDL: 29.2 mg/dL (ref 0.0–40.0)

## 2020-10-16 LAB — TSH: TSH: 4.52 u[IU]/mL — ABNORMAL HIGH (ref 0.35–4.50)

## 2020-10-17 ENCOUNTER — Other Ambulatory Visit: Payer: Self-pay | Admitting: Rheumatology

## 2020-10-19 ENCOUNTER — Other Ambulatory Visit: Payer: Self-pay | Admitting: Gastroenterology

## 2020-10-20 ENCOUNTER — Encounter: Payer: Self-pay | Admitting: *Deleted

## 2020-10-20 ENCOUNTER — Ambulatory Visit (INDEPENDENT_AMBULATORY_CARE_PROVIDER_SITE_OTHER): Admitting: Physician Assistant

## 2020-10-20 ENCOUNTER — Encounter: Payer: Self-pay | Admitting: Physician Assistant

## 2020-10-20 ENCOUNTER — Other Ambulatory Visit: Payer: Self-pay

## 2020-10-20 VITALS — BP 129/84 | HR 90 | Resp 16 | Ht 62.5 in | Wt 155.4 lb

## 2020-10-20 DIAGNOSIS — M19041 Primary osteoarthritis, right hand: Secondary | ICD-10-CM

## 2020-10-20 DIAGNOSIS — R5383 Other fatigue: Secondary | ICD-10-CM

## 2020-10-20 DIAGNOSIS — M19042 Primary osteoarthritis, left hand: Secondary | ICD-10-CM

## 2020-10-20 DIAGNOSIS — M7061 Trochanteric bursitis, right hip: Secondary | ICD-10-CM

## 2020-10-20 DIAGNOSIS — M5136 Other intervertebral disc degeneration, lumbar region: Secondary | ICD-10-CM

## 2020-10-20 DIAGNOSIS — M67911 Unspecified disorder of synovium and tendon, right shoulder: Secondary | ICD-10-CM

## 2020-10-20 DIAGNOSIS — Z862 Personal history of diseases of the blood and blood-forming organs and certain disorders involving the immune mechanism: Secondary | ICD-10-CM

## 2020-10-20 DIAGNOSIS — M797 Fibromyalgia: Secondary | ICD-10-CM

## 2020-10-20 DIAGNOSIS — M8589 Other specified disorders of bone density and structure, multiple sites: Secondary | ICD-10-CM

## 2020-10-20 DIAGNOSIS — M51369 Other intervertebral disc degeneration, lumbar region without mention of lumbar back pain or lower extremity pain: Secondary | ICD-10-CM

## 2020-10-20 DIAGNOSIS — M7062 Trochanteric bursitis, left hip: Secondary | ICD-10-CM

## 2020-10-20 DIAGNOSIS — F5101 Primary insomnia: Secondary | ICD-10-CM

## 2020-10-20 DIAGNOSIS — M5030 Other cervical disc degeneration, unspecified cervical region: Secondary | ICD-10-CM

## 2020-10-20 DIAGNOSIS — Z8719 Personal history of other diseases of the digestive system: Secondary | ICD-10-CM

## 2020-10-20 DIAGNOSIS — Z8679 Personal history of other diseases of the circulatory system: Secondary | ICD-10-CM

## 2020-10-20 DIAGNOSIS — Z8639 Personal history of other endocrine, nutritional and metabolic disease: Secondary | ICD-10-CM

## 2020-10-20 DIAGNOSIS — Z8669 Personal history of other diseases of the nervous system and sense organs: Secondary | ICD-10-CM

## 2020-10-20 NOTE — Telephone Encounter (Signed)
Last Visit: 03/24/2020 Next Visit: 10/20/2020  Current Dose per office note on 03/24/2020, Flexeril 5 mg a mouth at bedtime as needed for muscle spasms and insomnia Dx: Fibromyalgia  Last Fill:07/27/2020  Okay to refill flexeril?

## 2020-10-20 NOTE — Patient Instructions (Signed)
Hand Exercises Hand exercises can be helpful for almost anyone. These exercises can strengthen the hands, improve flexibility and movement, and increase blood flow to the hands. These results can make work and daily tasks easier. Hand exercises can be especially helpful for people who have joint pain from arthritis or have nerve damage from overuse (carpal tunnel syndrome). These exercises can also help people who have injured a hand. Exercises Most of these hand exercises are gentle stretching and motion exercises. It is usually safe to do them often throughout the day. Warming up your hands before exercise may help to reduce stiffness. You can do this with gentle massage or by placing your hands in warm water for 10-15 minutes. It is normal to feel some stretching, pulling, tightness, or mild discomfort as you begin new exercises. This will gradually improve. Stop an exercise right away if you feel sudden, severe pain or your pain gets worse. Ask your health care provider which exercises are best for you. Knuckle bend or "claw" fist 1. Stand or sit with your arm, hand, and all five fingers pointed straight up. Make sure to keep your wrist straight during the exercise. 2. Gently bend your fingers down toward your palm until the tips of your fingers are touching the top of your palm. Keep your big knuckle straight and just bend the small knuckles in your fingers. 3. Hold this position for __________ seconds. 4. Straighten (extend) your fingers back to the starting position. Repeat this exercise 5-10 times with each hand. Full finger fist 1. Stand or sit with your arm, hand, and all five fingers pointed straight up. Make sure to keep your wrist straight during the exercise. 2. Gently bend your fingers into your palm until the tips of your fingers are touching the middle of your palm. 3. Hold this position for __________ seconds. 4. Extend your fingers back to the starting position, stretching every  joint fully. Repeat this exercise 5-10 times with each hand. Straight fist 1. Stand or sit with your arm, hand, and all five fingers pointed straight up. Make sure to keep your wrist straight during the exercise. 2. Gently bend your fingers at the big knuckle, where your fingers meet your hand, and the middle knuckle. Keep the knuckle at the tips of your fingers straight and try to touch the bottom of your palm. 3. Hold this position for __________ seconds. 4. Extend your fingers back to the starting position, stretching every joint fully. Repeat this exercise 5-10 times with each hand. Tabletop 1. Stand or sit with your arm, hand, and all five fingers pointed straight up. Make sure to keep your wrist straight during the exercise. 2. Gently bend your fingers at the big knuckle, where your fingers meet your hand, as far down as you can while keeping the small knuckles in your fingers straight. Think of forming a tabletop with your fingers. 3. Hold this position for __________ seconds. 4. Extend your fingers back to the starting position, stretching every joint fully. Repeat this exercise 5-10 times with each hand. Finger spread 1. Place your hand flat on a table with your palm facing down. Make sure your wrist stays straight as you do this exercise. 2. Spread your fingers and thumb apart from each other as far as you can until you feel a gentle stretch. Hold this position for __________ seconds. 3. Bring your fingers and thumb tight together again. Hold this position for __________ seconds. Repeat this exercise 5-10 times with each hand.  Making circles 1. Stand or sit with your arm, hand, and all five fingers pointed straight up. Make sure to keep your wrist straight during the exercise. 2. Make a circle by touching the tip of your thumb to the tip of your index finger. 3. Hold for __________ seconds. Then open your hand wide. 4. Repeat this motion with your thumb and each finger on your  hand. Repeat this exercise 5-10 times with each hand. Thumb motion 1. Sit with your forearm resting on a table and your wrist straight. Your thumb should be facing up toward the ceiling. Keep your fingers relaxed as you move your thumb. 2. Lift your thumb up as high as you can toward the ceiling. Hold for __________ seconds. 3. Bend your thumb across your palm as far as you can, reaching the tip of your thumb for the small finger (pinkie) side of your palm. Hold for __________ seconds. Repeat this exercise 5-10 times with each hand. Grip strengthening 1. Hold a stress ball or other soft ball in the middle of your hand. 2. Slowly increase the pressure, squeezing the ball as much as you can without causing pain. Think of bringing the tips of your fingers into the middle of your palm. All of your finger joints should bend when doing this exercise. 3. Hold your squeeze for __________ seconds, then relax. Repeat this exercise 5-10 times with each hand.   Contact a health care provider if:  Your hand pain or discomfort gets much worse when you do an exercise.  Your hand pain or discomfort does not improve within 2 hours after you exercise. If you have any of these problems, stop doing these exercises right away. Do not do them again unless your health care provider says that you can. Get help right away if:  You develop sudden, severe hand pain or swelling. If this happens, stop doing these exercises right away. Do not do them again unless your health care provider says that you can. This information is not intended to replace advice given to you by your health care provider. Make sure you discuss any questions you have with your health care provider. Document Revised: 08/30/2018 Document Reviewed: 05/10/2018 Elsevier Patient Education  2021 [employer].

## 2020-10-26 ENCOUNTER — Other Ambulatory Visit: Payer: Self-pay | Admitting: Family Medicine

## 2020-10-26 ENCOUNTER — Telehealth: Payer: Self-pay | Admitting: Orthopaedic Surgery

## 2020-10-26 DIAGNOSIS — Z1231 Encounter for screening mammogram for malignant neoplasm of breast: Secondary | ICD-10-CM

## 2020-10-26 NOTE — Telephone Encounter (Signed)
Holding for Kimberly G.-schedule is at 100% until then.

## 2020-10-26 NOTE — Telephone Encounter (Signed)
Pt called and is wondering if she could get worked in sooner than 11/17/2020 for her right shoulder and arm. If you would give her a call back [phone]

## 2020-10-26 NOTE — Telephone Encounter (Signed)
Appt became available on Thursday 6/9 due to cancellation. Patient scheduled for 6/9 at 1:15pm. Appt on 6/28 cancelled.

## 2020-10-29 ENCOUNTER — Ambulatory Visit

## 2020-10-29 ENCOUNTER — Encounter: Payer: Self-pay | Admitting: Orthopaedic Surgery

## 2020-10-29 ENCOUNTER — Ambulatory Visit: Payer: Self-pay

## 2020-10-29 ENCOUNTER — Other Ambulatory Visit: Payer: Self-pay

## 2020-10-29 ENCOUNTER — Ambulatory Visit (INDEPENDENT_AMBULATORY_CARE_PROVIDER_SITE_OTHER): Admitting: Orthopaedic Surgery

## 2020-10-29 DIAGNOSIS — M25511 Pain in right shoulder: Secondary | ICD-10-CM | POA: Diagnosis not present

## 2020-10-29 DIAGNOSIS — G8929 Other chronic pain: Secondary | ICD-10-CM

## 2020-10-29 DIAGNOSIS — M7541 Impingement syndrome of right shoulder: Secondary | ICD-10-CM

## 2020-10-29 NOTE — Progress Notes (Signed)
Office Visit Note   Patient: Kimberly Stein           Date of Birth: [birth year]           MRN: [id number] Visit Date: 10/29/2020              Requested by: Eulas Post, MD [place],  [place] [address] PCP: Eulas Post, MD   Assessment & Plan: Visit Diagnoses:  1. Impingement syndrome of right shoulder     Plan: Several month history of right shoulder pain insidious in onset without injury or trauma.  Having pain with elevation and particularly abduction.  Has positive impingement but x-rays are negative for any abnormality.  I do not think she has a rotator cuff tear.  She has a history of fibromyalgia which may be playing a part in this.  We discussed different treatment options including a subacromial cortisone injection but she would rather try physical therapy.  We will set this up at [facility] facility and plan to check her back in a month or so  Follow-Up Instructions: No follow-ups on file.   Orders:  Orders Placed This Encounter  Procedures   XR Shoulder Right   Ambulatory referral to Physical Therapy   No orders of the defined types were placed in this encounter.     Procedures: No procedures performed   Clinical Data: No additional findings.   Subjective: Chief Complaint  Patient presents with   Right Shoulder - Pain    Pain with lifting out to the side, sometimes it is tender to touch ad she has pain down the right forearm.  Insidious onset of pain approximately 4 to 6 weeks ago.  Having difficulty raising her arm over her head and abduction particularly when she lifts some object like her hand bag.  No numbness or tingling.  No neck pain  HPI  Review of Systems   Objective: Vital Signs: There were no vitals taken for this visit.  Physical Exam Constitutional:      Appearance: She is well-developed.  Eyes:     Pupils: Pupils are equal, round, and reactive to light.  Pulmonary:     Effort: Pulmonary  effort is normal.  Skin:    General: Skin is warm and dry.  Neurological:     Mental Status: She is alert and oriented to person, place, and time.  Psychiatric:        Behavior: Behavior normal.    Ortho Exam awake alert and oriented x3.  Comfortable sitting.  Positive impingement and empty can testing but mild right shoulder.  No grating or crepitation able to place her arm fully overhead with just minimal circuitous motion.  Positive Speed sign but biceps intact.  Skin intact.  Multiple areas of tenderness which could be her fibromyalgia.  Specialty Comments:  No specialty comments available.  Imaging: XR Shoulder Right  Result Date: 10/29/2020 Films of the right shoulder obtained in several projections.  The humeral head is centered about the glenoid.  Normal space between the humeral head and the acromion.  Minimal degenerative changes at the [facility] joint.  A flat acromion.  No evidence of any bony changes or evidence of osteoarthritis.  No acute changes    PMFS History: Patient Active Problem List   Diagnosis Date Noted   Low back pain 11/26/2019   Impingement syndrome of right shoulder 11/26/2019   Belching 04/17/2019   Screening for viral disease 04/17/2019  Other fatigue 06/13/2016   Primary insomnia 06/13/2016   Primary osteoarthritis of both hands 06/13/2016   Trochanteric bursitis of both hips 06/13/2016   DDD cervical spine status post fusion 06/13/2016   Facet arthritis, degenerative, lumbar spine 06/13/2016   Tendinopathy of right rotator cuff 06/13/2016   Chest pain, atypical 02/25/2015   Abnormal nuclear stress test 02/25/2015   Hyperlipidemia 07/17/2013   Osteopenia 01/28/2012   OPIOID DEPENDENCE 03/09/2010   CELIAC SPRUE 09/29/2009   Hypothyroidism 09/07/2009   Meniere's disease 09/07/2009   Essential hypertension 09/07/2009   RUQ PAIN 09/07/2009   Fibromyalgia 06/12/2009   CERVICALGIA 06/03/2009   ANEMIA-IRON DEFICIENCY 10/21/2008   Gastroesophageal  reflux disease 10/21/2008   DIZZINESS 10/21/2008   COLONIC POLYPS, HX OF 10/21/2008   Past Medical History:  Diagnosis Date   Allergy    sinus infections   Anemia    Arthritis    Celiac disease    Fibromyalgia    GERD (gastroesophageal reflux disease)    Heart murmur    Hiatal hernia    Hyperlipidemia    Hypertension    Meniere's disease    Neuromuscular disorder ([place])    fibromyalgia   Thyroid disease    hypothyroid   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    Family History  Problem Relation Age of Onset   Celiac disease Mother    Diabetes Mother    Heart disease Mother    Prostate cancer Father    Breast cancer Maternal Aunt    Diabetes Sister    Meniere's disease Sister    Celiac disease Sister    Diabetes Sister    Colon cancer Neg Hx    Esophageal cancer Neg Hx    Stomach cancer Neg Hx    Rectal cancer Neg Hx    Liver cancer Neg Hx    Pancreatic cancer Neg Hx     Past Surgical History:  Procedure Laterality Date   CARDIAC CATHETERIZATION N/A 02/25/2015   Procedure: Left Heart Cath and Coronary Angiography;  Surgeon: Joselito Fieldhouse M Martinique, MD;  Location: [facility] [facility];  Service: Cardiovascular;  Laterality: N/A;   CERVICAL DISC SURGERY     CHOLECYSTECTOMY     COLONOSCOPY     DILATION AND CURETTAGE OF UTERUS     HAND SURGERY Right    right hand; calcium deposit on wrist and under thumb and ganglion cyst and carpal tunnel   lumbar radiofrequency nerve ablation     twice yearly   MOUTH SURGERY  2016   couple times due to gum disease   ROTATOR CUFF REPAIR Left 06/07/2015   left shoulder   TOTAL ABDOMINAL HYSTERECTOMY     UPPER GASTROINTESTINAL ENDOSCOPY     Social History   Occupational History   Occupation: Housewife  Tobacco Use   Smoking status: Every Day    Packs/day: 1.00    Years: 46.00    Pack years: 46.00    Types: Cigarettes   Smokeless tobacco: Never  Vaping Use   Vaping Use: Never used  Substance and Sexual Activity   Alcohol use: No     Alcohol/week: 0.0 standard drinks   Drug use: No   Sexual activity: Not on file     Garald Balding, MD   Note - This record has been created using Editor, commissioning.  Chart creation errors have been sought, but may not always  have been located. Such creation errors do not reflect on  the standard of medical care.

## 2020-10-31 ENCOUNTER — Other Ambulatory Visit: Payer: Self-pay | Admitting: Family Medicine

## 2020-11-06 ENCOUNTER — Telehealth: Payer: Self-pay | Admitting: Orthopaedic Surgery

## 2020-11-06 NOTE — Telephone Encounter (Signed)
Pt would like a CB with the number to the PT facility we sent her referral to.  [phone]

## 2020-11-06 NOTE — Telephone Encounter (Signed)
Spoke with patient.

## 2020-11-07 ENCOUNTER — Other Ambulatory Visit: Payer: Self-pay | Admitting: Family Medicine

## 2020-11-16 ENCOUNTER — Telehealth: Payer: Self-pay | Admitting: Family Medicine

## 2020-11-16 NOTE — Telephone Encounter (Signed)
For has been placed in Dr. Anastasio Auerbach red folder.

## 2020-11-16 NOTE — Telephone Encounter (Signed)
Patient dropped off form for jury duty that she would like Dr. Elease Hashimoto to complete for her.  Patient would like a call at [phone] when form is completed and ready to pick up.   Form will be placed in folder.  Please advise.

## 2020-11-16 NOTE — Telephone Encounter (Signed)
Spoke with the patient. She stated she has urinary frequency, and sever body cramps.

## 2020-11-17 ENCOUNTER — Encounter: Payer: Self-pay | Admitting: Family Medicine

## 2020-11-17 ENCOUNTER — Ambulatory Visit: Attending: Orthopaedic Surgery | Admitting: Physical Therapy

## 2020-11-17 ENCOUNTER — Encounter: Payer: Self-pay | Admitting: Physical Therapy

## 2020-11-17 ENCOUNTER — Ambulatory Visit: Admitting: Orthopaedic Surgery

## 2020-11-17 ENCOUNTER — Other Ambulatory Visit: Payer: Self-pay

## 2020-11-17 DIAGNOSIS — G8929 Other chronic pain: Secondary | ICD-10-CM | POA: Diagnosis present

## 2020-11-17 DIAGNOSIS — M6281 Muscle weakness (generalized): Secondary | ICD-10-CM | POA: Diagnosis present

## 2020-11-17 DIAGNOSIS — M25511 Pain in right shoulder: Secondary | ICD-10-CM | POA: Diagnosis not present

## 2020-11-17 DIAGNOSIS — R293 Abnormal posture: Secondary | ICD-10-CM | POA: Insufficient documentation

## 2020-11-17 NOTE — Therapy (Signed)
[facility] [address], [place], [place], [address] Phone: [phone]   Fax:  [phone]  Physical Therapy Evaluation  Patient Details  Name: Kimberly Stein MRN: [id number] Date of Birth: July 23, 1955 Referring Provider (PT): Garald Balding, MD   Encounter Date: 11/17/2020   PT End of Session - 11/17/20 0757     Visit Number 1    Date for PT Re-Evaluation 01/12/21    Authorization Type [employer]    PT Start Time 0800    PT Stop Time 0840    PT Time Calculation (min) 40 min    Activity Tolerance Patient tolerated treatment well    Behavior During Therapy [facility] for tasks assessed/performed             Past Medical History:  Diagnosis Date   Allergy    sinus infections   Anemia    Arthritis    Celiac disease    Fibromyalgia    GERD (gastroesophageal reflux disease)    Heart murmur    Hiatal hernia    Hyperlipidemia    Hypertension    Meniere's disease    Neuromuscular disorder ([place])    fibromyalgia   Thyroid disease    hypothyroid   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    Past Surgical History:  Procedure Laterality Date   CARDIAC CATHETERIZATION N/A 02/25/2015   Procedure: Left Heart Cath and Coronary Angiography;  Surgeon: Peter M Martinique, MD;  Location: [place] [facility];  Service: Cardiovascular;  Laterality: N/A;   CERVICAL DISC SURGERY     CHOLECYSTECTOMY     COLONOSCOPY     DILATION AND CURETTAGE OF UTERUS     HAND SURGERY Right    right hand; calcium deposit on wrist and under thumb and ganglion cyst and carpal tunnel   lumbar radiofrequency nerve ablation     twice yearly   MOUTH SURGERY  2016   couple times due to gum disease   ROTATOR CUFF REPAIR Left 06/07/2015   left shoulder   TOTAL ABDOMINAL HYSTERECTOMY     UPPER GASTROINTESTINAL ENDOSCOPY      There were no vitals filed for this visit.    Subjective Assessment - 11/17/20 0756     Subjective Pt is Rt hand dominant  female referred to OPPT with diagnosis of Rt shoulder impingement syndrome.  Symptoms began insidiously 1-2 years ago.  Pt has delayed attention to it due to COVID.  Sometimes pain sreads to posterior arm and forearm.  I need to do PT first to see if it helps and if not, I'll get an MRI.  Pain is worse with lifting, reaching and sleeping on Rt side (has to due to painful Lt hip and can't lay on back).    Pertinent History PMH: fibromyalgia, RC repair Lt shoulder 2017, Rt hand surgery    Limitations Lifting;House hold activities    Diagnostic tests Rt shoulder xray 10/2020 negative, min degen at [facility] joint, flat acromion    Currently in Pain? Yes    Pain Score 6    pain fluctuates   Pain Location Shoulder    Pain Orientation Right    Pain Descriptors / Indicators Throbbing;Sharp;Aching    Pain Type Chronic pain    Pain Radiating Towards posterior arm and forearm    Pain Onset More than a month ago    Pain Frequency Constant    Aggravating Factors  lifting, carrying, reaching    Pain Relieving Factors ice  Effect of Pain on Daily Activities ADLs, household tasks                [facility] PT Assessment - 11/17/20 0001       Assessment   Medical Diagnosis M75.41 (ICD-10-CM) - Impingement syndrome of right shoulder    Referring Provider (PT) Garald Balding, MD    Onset Date/Surgical Date --   1-2 years ago   Hand Dominance Right    Next MD Visit after PT ends    Prior Therapy yes here at this facility for Lt shoulder after [facility] repair      Balance Screen   Has the patient fallen in the past 6 months No      [place] residence    Living Arrangements Spouse/significant other    Type of [place]      Prior Function   Level of [place] Retired    Leisure busy keeping up house, gardens      Observation/Other Assessments   Focus on Therapeutic Outcomes (FOTO)  49% goal 64%      Posture/Postural Control    Posture/Postural Control Postural limitations    Postural Limitations Rounded Shoulders;Forward head;Increased thoracic kyphosis      ROM / Strength   AROM / PROM / Strength AROM;PROM;Strength      AROM   Overall AROM Comments cervical ROM limited 20% all planes related to stiff forward head posture, Lt shoulder    AROM Assessment Site Shoulder    Right/Left Shoulder Right    Right Shoulder Extension 55 Degrees    Right Shoulder Flexion 165 Degrees    Right Shoulder ABduction 152 Degrees   + painful arc   Right Shoulder Internal Rotation 80 Degrees   pain with functional reach behind back, full   Right Shoulder External Rotation 70 Degrees   pain with functional reach to back of head, full     PROM   PROM Assessment Site Shoulder    Right/Left Shoulder Right    Right Shoulder Flexion 170 Degrees      Strength   Overall Strength Comments Lt shoulder WNL, Rt shoulder strong and painful on testing    Strength Assessment Site Shoulder;Elbow    Right Shoulder Flexion 4/5   pain   Right Shoulder Extension 4+/5    Right Shoulder ABduction 4/5   pain   Right Shoulder Internal Rotation 4+/5   pain   Right Shoulder External Rotation 4+/5   pain   Right/Left Elbow Right    Right Elbow Flexion 5/5   pain   Right Elbow Extension 5/5   pain     Palpation   Spinal mobility Rt AC joint restricted, Rt GH joint:    Palpation comment Rt bicep tendon, Rt RC tendons, Rt supraspinatus muscle      Special Tests    Special Tests Rotator Cuff Impingement;Biceps/Labral Tests    Rotator Cuff Impingment tests Neer impingement test;Hawkins- Kennedy test;Empty Can test    Biceps/Labral tests Speeds Test      Neer Impingement test    Findings Positive    Side Right      Hawkins-Kennedy test   Findings Positive    Side Right      Empty Can test   Findings Positive    Side Right      Speeds test   findings Positive    Side Right  Objective measurements  completed on examination: See above findings.                 PT Short Term Goals - 11/17/20 0758       PT SHORT TERM GOAL #1   Title be independent in initial HEP with proper cervical and scapular posture throughout HEP    Time 4    Period Weeks    Status New    Target Date 12/15/20      PT SHORT TERM GOAL #2   Title Pt will report reduced Rt shoulder pain with light daily tasks by at least 20%    Time 4    Period Weeks    Status New    Target Date 12/15/20      PT SHORT TERM GOAL #3   Title perform self-care overhead and behind the back with 25% increased ease with Rt UE    Time 4    Period Weeks    Status New    Target Date 12/15/20      PT SHORT TERM GOAL #4   Title -               PT Long Term Goals - 11/17/20 1243       PT LONG TERM GOAL #1   Title be independent in advanced HEP    Time 8    Period Weeks    Status New    Target Date 01/12/21      PT LONG TERM GOAL #2   Title improve FOTO score from 49% to 64% to demo improved functional use of Rt UE    Time 8    Period Weeks    Status New    Target Date 01/12/21      PT LONG TERM GOAL #3   Title Pt will demo Rt shoulder strength of at least 4+/5 with min or no pain on testing.    Time 8    Period Weeks    Status New    Target Date 01/12/21      PT LONG TERM GOAL #4   Title report at least a 60% reduction in Rt shoulder pain with use for ADLs and household tasks    Time 8    Period Weeks    Status New    Target Date 01/12/21      PT LONG TERM GOAL #5   Title Pt will demo full A/ROM on Rt without pain due to improved scapulohumeral rhythm and strength of stabilizers    Time 8    Period Weeks    Status New    Target Date 01/12/21                    Plan - 11/17/20 0840     Clinical Impression Statement Pt is a Rt hand dominant female with history of insidious onset of Rt shoulder pain 1-2 years ago.  Pain is rated 6/10 and is worse with laying on Rt side, with use  for household activities and ADLs, and in evening in general.  She has history of Lt RC repair.  Xrays are negative for Rt shoulder.  Pain is sharp and can ache down posterior arm and forearm.  She has no neck pain.  Pt presents with signif thoracic kyphosis, forward head and rounded shoulders.  She has shoulder A/ROM WNL with painful arc with abduction>flexion, and pain with functional IR.  Rt shoulder and elbow are strong and painful  on testing.  She has signif tenderness of Rt bicep tendon and Rt RC tendons.  + special tests for bicep and impingement signs.  Scapular strength is 4-/5 with limited mobility.  Pt will benefit from skilled PT to address pain and functional strength and use of Rt dominant UE.    Personal Factors and Comorbidities Age;Time since onset of injury/illness/exacerbation    Examination-Activity Limitations Reach Overhead;Carry;Sleep;Lift;Hygiene/Grooming;Dressing    Examination-Participation Restrictions Cleaning;Meal Prep;Community Activity;Driving;Shop;Laundry;Yard Work    Stability/Clinical Decision Making Stable/Uncomplicated    Clinical Decision Making Low    Rehab Potential Good    PT Frequency 2x / week    PT Duration 8 weeks    PT Treatment/Interventions ADLs/Self Care Home Management;Cryotherapy;Electrical Stimulation;Iontophoresis 74m/ml Dexamethasone;Moist Heat;Functional mobility training;Therapeutic activities;Therapeutic exercise;Neuromuscular re-education;Manual techniques;Patient/family education;Passive range of motion;Dry needling;Taping;Spinal Manipulations;Joint Manipulations    PT Next Visit Plan ionto #1 if cert signed, review HEP, Rt scapular and [place] strength arm in neutral, AA/ROM as tol    PT Home Exercise Plan Access Code: [email or web address]   Consulted and Agree with Plan of Care Patient             Patient will benefit from skilled therapeutic intervention in order to improve the following deficits and impairments:  Pain, Decreased strength,  Improper body mechanics, Postural dysfunction, Hypomobility, Impaired UE functional use, Decreased activity tolerance  Visit Diagnosis: Chronic right shoulder pain - Plan: PT plan of care cert/re-cert  Muscle weakness (generalized) - Plan: PT plan of care cert/re-cert  Abnormal posture - Plan: PT plan of care cert/re-cert     Problem List Patient Active Problem List   Diagnosis Date Noted   Pain in right shoulder 10/29/2020   Low back pain 11/26/2019   Impingement syndrome of right shoulder 11/26/2019   Belching 04/17/2019   Screening for viral disease 04/17/2019   Other fatigue 06/13/2016   Primary insomnia 06/13/2016   Primary osteoarthritis of both hands 06/13/2016   Trochanteric bursitis of both hips 06/13/2016   DDD cervical spine status post fusion 06/13/2016   Facet arthritis, degenerative, lumbar spine 06/13/2016   Tendinopathy of right rotator cuff 06/13/2016   Chest pain, atypical 02/25/2015   Abnormal nuclear stress test 02/25/2015   Hyperlipidemia 07/17/2013   Osteopenia 01/28/2012   OPIOID DEPENDENCE 03/09/2010   CELIAC SPRUE 09/29/2009   Hypothyroidism 09/07/2009   Meniere's disease 09/07/2009   Essential hypertension 09/07/2009   RUQ PAIN 09/07/2009   Fibromyalgia 06/12/2009   CERVICALGIA 06/03/2009   ANEMIA-IRON DEFICIENCY 10/21/2008   Gastroesophageal reflux disease 10/21/2008   DIZZINESS 10/21/2008   COLONIC POLYPS, HX OF 10/21/2008    JBaruch Merl PT 11/17/20 12:47 PM   [facility] [address] [place] [address]: [phone]  Fax:  [phone] Name: Kimberly ELLWANGERMRN: [id number] of Birth: 7May 14, 1957

## 2020-11-17 NOTE — Patient Instructions (Signed)
Access Code: [email or web address] URL: [email or web address] Date: 11/17/2020 Prepared by: Venetia Night Hiliary Osorto  Exercises Seated Scapular Retraction - 3 x daily - 7 x weekly - 1 sets - 15 reps Standing Backward Shoulder Rolls - 3 x daily - 7 x weekly - 1 sets - 15 reps Standing Scapular Clock (1 to 7) - 1 x daily - 7 x weekly - 1 sets - 10 reps Seated Chin Tuck with Neck Elongation - 3 x daily - 7 x weekly - 1 sets - 15 reps - with shoulder rolls and scap retration hold

## 2020-11-17 NOTE — Telephone Encounter (Signed)
Spoke wil the patient. She is aware the letter has been completed and placed up front for pick up. Nothing further needed.

## 2020-11-20 ENCOUNTER — Encounter: Admitting: Physical Therapy

## 2020-11-25 ENCOUNTER — Encounter: Payer: Self-pay | Admitting: Physical Therapy

## 2020-11-25 ENCOUNTER — Ambulatory Visit: Payer: Medicare Other | Attending: Orthopaedic Surgery | Admitting: Physical Therapy

## 2020-11-25 ENCOUNTER — Other Ambulatory Visit: Payer: Self-pay

## 2020-11-25 DIAGNOSIS — M25511 Pain in right shoulder: Secondary | ICD-10-CM | POA: Insufficient documentation

## 2020-11-25 DIAGNOSIS — M6281 Muscle weakness (generalized): Secondary | ICD-10-CM | POA: Diagnosis present

## 2020-11-25 DIAGNOSIS — R293 Abnormal posture: Secondary | ICD-10-CM | POA: Diagnosis present

## 2020-11-25 DIAGNOSIS — G8929 Other chronic pain: Secondary | ICD-10-CM | POA: Diagnosis present

## 2020-11-25 NOTE — Therapy (Signed)
[facility] [address], [place], [place], [address] Phone: [phone]   Fax:  [phone]  Physical Therapy Treatment  Patient Details  Name: Kimberly Stein MRN: [id number] Date of Birth: 29-Apr-1956 Referring Provider (PT): Garald Balding, MD   Encounter Date: 11/25/2020   PT End of Session - 11/25/20 0844     Visit Number 2    Date for PT Re-Evaluation 01/12/21    Authorization Type Medicare Part A and B - new insurance as of this month    Progress Note Due on Visit 10    PT Start Time 0845    PT Stop Time 0923    PT Time Calculation (min) 38 min    Activity Tolerance Patient tolerated treatment well    Behavior During Therapy [facility] for tasks assessed/performed             Past Medical History:  Diagnosis Date   Allergy    sinus infections   Anemia    Arthritis    Celiac disease    Fibromyalgia    GERD (gastroesophageal reflux disease)    Heart murmur    Hiatal hernia    Hyperlipidemia    Hypertension    Meniere's disease    Neuromuscular disorder ([place])    fibromyalgia   Thyroid disease    hypothyroid   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    Past Surgical History:  Procedure Laterality Date   CARDIAC CATHETERIZATION N/A 02/25/2015   Procedure: Left Heart Cath and Coronary Angiography;  Surgeon: Peter M Martinique, MD;  Location: [place] [facility];  Service: Cardiovascular;  Laterality: N/A;   CERVICAL DISC SURGERY     CHOLECYSTECTOMY     COLONOSCOPY     DILATION AND CURETTAGE OF UTERUS     HAND SURGERY Right    right hand; calcium deposit on wrist and under thumb and ganglion cyst and carpal tunnel   lumbar radiofrequency nerve ablation     twice yearly   MOUTH SURGERY  2016   couple times due to gum disease   ROTATOR CUFF REPAIR Left 06/07/2015   left shoulder   TOTAL ABDOMINAL HYSTERECTOMY     UPPER GASTROINTESTINAL ENDOSCOPY      There were no vitals filed for this visit.    Subjective Assessment - 11/25/20 0847     Subjective I am about the same - maybe more sore.  If I move the Rt shoulder the wrong way it can be a 10/10.  Generally today 4/10.    Pertinent History PMH: fibromyalgia, RC repair Lt shoulder 2017, Rt hand surgery    Limitations Lifting;House hold activities    Diagnostic tests Rt shoulder xray 10/2020 negative, min degen at [facility] joint, flat acromion    Currently in Pain? Yes    Pain Score 4     Pain Location Shoulder    Pain Orientation Right    Pain Descriptors / Indicators Sharp;Throbbing;Aching    Pain Type Chronic pain    Pain Radiating Towards post arm and forearm    Pain Onset More than a month ago    Pain Frequency Constant    Aggravating Factors  lift, carry, reach    Pain Relieving Factors ice    Effect of Pain on Daily Activities ADLs, household tasks  [place] [facility] PT Treatment/Exercise - 11/25/20 0001       Exercises   Exercises Shoulder      Shoulder Exercises: Seated   Other Seated Exercises seated ranger 1x10 each at 90 deg reach flexion and scaption      Shoulder Exercises: Sidelying   Other Sidelying Exercises A/ROM scapular clocks Rt shoulder in Lt SL: 12:00/6:00, 9:00/3:00, 1:00/7:00 x 10 each      Shoulder Exercises: Standing   External Rotation Strengthening;Right;10 reps;Theraband    Theraband Level (Shoulder External Rotation) Level 1 (Yellow)    Internal Rotation Strengthening;Right;10 reps;Theraband    Theraband Level (Shoulder Internal Rotation) Level 1 (Yellow)    Extension Strengthening;Both;15 reps;Theraband    Theraband Level (Shoulder Extension) Level 1 (Yellow)    Extension Limitations TC for guided Rt scapular retraction/depression to avoid hiking    Row Strengthening;Both;15 reps;Theraband    Theraband Level (Shoulder Row) Level 1 (Yellow)      Shoulder Exercises: Pulleys   Flexion 2 minutes    Scaption 2 minutes      Shoulder Exercises:  ROM/Strengthening   Ball on Wall 10x circles each way with serratus press    Other ROM/Strengthening Exercises backward shoulder rolls standing x 10    Other ROM/Strengthening Exercises finger ladder to #19-22 x 10 reps      Modalities   Modalities --   Pt declined ionto today     Iontophoresis   Type of Iontophoresis --    Location --    Time --                    PT Education - 11/25/20 0918     Education Details Access Code: [email or web address], added yellow tband neutral shoulder strength    Person(s) Educated Patient    Methods Explanation;Demonstration;Handout    Comprehension Verbalized understanding;Returned demonstration              PT Short Term Goals - 11/25/20 0927       PT SHORT TERM GOAL #1   Title be independent in initial HEP with proper cervical and scapular posture throughout HEP    Status On-going      PT SHORT TERM GOAL #2   Title Pt will report reduced Rt shoulder pain with light daily tasks by at least 20%    Status New               PT Long Term Goals - 11/17/20 1243       PT LONG TERM GOAL #1   Title be independent in advanced HEP    Time 8    Period Weeks    Status New    Target Date 01/12/21      PT LONG TERM GOAL #2   Title improve FOTO score from 49% to 64% to demo improved functional use of Rt UE    Time 8    Period Weeks    Status New    Target Date 01/12/21      PT LONG TERM GOAL #3   Title Pt will demo Rt shoulder strength of at least 4+/5 with min or no pain on testing.    Time 8    Period Weeks    Status New    Target Date 01/12/21      PT LONG TERM GOAL #4   Title report at least a 60% reduction in Rt shoulder pain with use for ADLs and household tasks    Time 8  Period Weeks    Status New    Target Date 01/12/21      PT LONG TERM GOAL #5   Title Pt will demo full A/ROM on Rt without pain due to improved scapulohumeral rhythm and strength of stabilizers    Time 8    Period Weeks    Status New     Target Date 01/12/21                   Plan - 11/25/20 8366     Clinical Impression Statement Pt arrives for first follow up session since initial eval.  She has been compliant with initial HEP and demos good/improved awareness of postural alignment when cued.  She reported ongoing soreness with occassional sharp twinges in Rt shoulder when she moves the "wrong way."  PT introduced RC and scapular strength training today with arm at side with good tolerance so HEP updated to reflect ther ex from today.  PT provided ongoing TC/VC for proper mechanics of scapula to avoid hiking and promote centering of humeral head.  Pt was able to reach level 22 with finger ladder after intial reps to 19 with good tolerance.  Pt declined ionto patch today as she was upset about her sick dog and just didn't want another variable in her day.  May consider at future visit.    Rehab Potential Good    PT Frequency 2x / week    PT Duration 8 weeks    PT Next Visit Plan review HEP, continue AA/ROM above 90 deg as tol, intro strength for flexion and scaption to 90 deg or below as tol, ionto #1 to RC or bicep tendon (whichever more tender) if Pt ok to try    PT Home Exercise Plan Access Code: [email or web address]    Consulted and Agree with Plan of Care Patient             Patient will benefit from skilled therapeutic intervention in order to improve the following deficits and impairments:     Visit Diagnosis: Chronic right shoulder pain  Muscle weakness (generalized)  Abnormal posture     Problem List Patient Active Problem List   Diagnosis Date Noted   Pain in right shoulder 10/29/2020   Low back pain 11/26/2019   Impingement syndrome of right shoulder 11/26/2019   Belching 04/17/2019   Screening for viral disease 04/17/2019   Other fatigue 06/13/2016   Primary insomnia 06/13/2016   Primary osteoarthritis of both hands 06/13/2016   Trochanteric bursitis of both hips 06/13/2016   DDD cervical spine  status post fusion 06/13/2016   Facet arthritis, degenerative, lumbar spine 06/13/2016   Tendinopathy of right rotator cuff 06/13/2016   Chest pain, atypical 02/25/2015   Abnormal nuclear stress test 02/25/2015   Hyperlipidemia 07/17/2013   Osteopenia 01/28/2012   OPIOID DEPENDENCE 03/09/2010   CELIAC SPRUE 09/29/2009   Hypothyroidism 09/07/2009   Meniere's disease 09/07/2009   Essential hypertension 09/07/2009   RUQ PAIN 09/07/2009   Fibromyalgia 06/12/2009   CERVICALGIA 06/03/2009   ANEMIA-IRON DEFICIENCY 10/21/2008   Gastroesophageal reflux disease 10/21/2008   DIZZINESS 10/21/2008   COLONIC POLYPS, HX OF 10/21/2008    Jessia Kief, PT 11/25/20 9:27 AM   [facility] [address], [place], [place], [address] Phone: [phone]   Fax:  [phone]  Name: Kimberly Stein MRN: [id number] Date of Birth: 02/15/1956

## 2020-11-25 NOTE — Patient Instructions (Signed)
Access Code: [email or web address] URL: [email or web address] Date: 11/25/2020 Prepared by: Venetia Night Maddisen Vought  Exercises Seated Scapular Retraction - 3 x daily - 7 x weekly - 1 sets - 15 reps Standing Backward Shoulder Rolls - 3 x daily - 7 x weekly - 1 sets - 15 reps Standing Scapular Clock (1 to 7) - 1 x daily - 7 x weekly - 1 sets - 10 reps Seated Chin Tuck with Neck Elongation - 3 x daily - 7 x weekly - 1 sets - 15 reps - with shoulder rolls and scap retration hold Standing Bilateral Low Shoulder Row with Anchored Resistance - 1 x daily - 7 x weekly - 1 sets - 10 reps Shoulder External Rotation with Anchored Resistance - 1 x daily - 7 x weekly - 1 sets - 10 reps Standing Shoulder Internal Rotation with Anchored Resistance - 1 x daily - 7 x weekly - 1 sets - 10 reps Single Arm Shoulder Extension with Anchored Resistance - 1 x daily - 7 x weekly - 1 sets - 10 reps Seated Shoulder Flexion Towel Slide at Table Top - 1 x daily - 7 x weekly - 1 sets - 10 reps

## 2020-12-03 ENCOUNTER — Encounter: Admitting: Physical Therapy

## 2020-12-09 ENCOUNTER — Encounter: Admitting: Physical Therapy

## 2020-12-11 ENCOUNTER — Encounter: Admitting: Physical Therapy

## 2020-12-16 ENCOUNTER — Encounter: Admitting: Physical Therapy

## 2020-12-18 ENCOUNTER — Encounter: Admitting: Physical Therapy

## 2020-12-22 ENCOUNTER — Other Ambulatory Visit: Payer: Self-pay

## 2020-12-22 ENCOUNTER — Ambulatory Visit
Admission: RE | Admit: 2020-12-22 | Discharge: 2020-12-22 | Disposition: A | Discharge status: Home / Self Care | Payer: Medicare Other | Source: Ambulatory Visit | Attending: Family Medicine | Admitting: Family Medicine

## 2020-12-22 DIAGNOSIS — Z1231 Encounter for screening mammogram for malignant neoplasm of breast: Secondary | ICD-10-CM

## 2020-12-23 ENCOUNTER — Ambulatory Visit: Payer: Medicare Other | Admitting: Physical Therapy

## 2020-12-25 ENCOUNTER — Ambulatory Visit: Payer: Medicare Other | Admitting: Physical Therapy

## 2020-12-30 ENCOUNTER — Encounter: Admitting: Physical Therapy

## 2021-01-01 ENCOUNTER — Encounter: Admitting: Physical Therapy

## 2021-01-02 ENCOUNTER — Other Ambulatory Visit: Payer: Self-pay | Admitting: Family Medicine

## 2021-01-06 ENCOUNTER — Encounter: Admitting: Physical Therapy

## 2021-01-08 ENCOUNTER — Encounter: Admitting: Physical Therapy

## 2021-01-13 ENCOUNTER — Encounter: Admitting: Physical Therapy

## 2021-01-15 ENCOUNTER — Encounter: Admitting: Physical Therapy

## 2021-01-21 ENCOUNTER — Other Ambulatory Visit: Payer: Self-pay | Admitting: Physician Assistant

## 2021-01-21 NOTE — Telephone Encounter (Signed)
Next Visit: 04/20/2021  Last Visit: 10/20/2020  Last Fill: 10/20/2020  Dx: Primary insomnia  Current Dose per office note on 10/20/2020: Flexeril 5 mg a mouth at bedtime to help with muscle spasms and insomnia  Okay to refill Flexeril?

## 2021-01-31 ENCOUNTER — Other Ambulatory Visit: Payer: Self-pay | Admitting: Family Medicine

## 2021-02-04 ENCOUNTER — Other Ambulatory Visit: Payer: Self-pay | Admitting: Family Medicine

## 2021-04-02 ENCOUNTER — Other Ambulatory Visit: Payer: Self-pay | Admitting: Family Medicine

## 2021-04-06 NOTE — Progress Notes (Signed)
Office Visit Note  Patient: Kimberly Stein             Date of Birth: Apr 02, 1956           MRN: [id number]             PCP: Eulas Post, MD Referring: Eulas Post, MD Visit Date: 04/20/2021 Occupation: @GUAROCC @  Subjective:  Lower back pain   History of Present Illness: Kimberly Stein is a 65 y.o. female with history of fibromyalgia and DDD.  She reports that her last fibromyalgia flare was in August and lasted for about 1 week.  She states that she has been experiencing increased lower back pain with bilateral radiculopathy.  She states that she has been performing home exercises that her symptoms have been worsening.  She states she is also been experiencing more frequent muscle spasms.  She takes Flexeril 5 mg 1 to 2 tablets at bedtime as needed for muscle spasms.  She states that since switching to Medicare they will no longer pay for her to have ablations every 6 months.  She is planning on reaching out to Dr. Ronnald Ramp for further evaluation and management of her lower back pain.    Activities of Daily Living:  Patient reports morning stiffness for 1 hour.   Patient Reports nocturnal pain.  Difficulty dressing/grooming: Reports Difficulty climbing stairs: Reports Difficulty getting out of chair: Reports Difficulty using hands for taps, buttons, cutlery, and/or writing: Denies  Review of Systems  Constitutional:  Positive for fatigue.  HENT:  Positive for mouth dryness. Negative for mouth sores and nose dryness.   Eyes:  Negative for pain, itching and dryness.  Respiratory:  Negative for shortness of breath and difficulty breathing.   Cardiovascular:  Negative for chest pain and palpitations.  Gastrointestinal:  Negative for blood in stool, constipation and diarrhea.  Endocrine: Negative for increased urination.  Genitourinary:  Negative for difficulty urinating.  Musculoskeletal:  Positive for joint pain, joint pain, myalgias, morning stiffness, muscle tenderness  and myalgias. Negative for joint swelling.  Skin:  Negative for color change, rash and redness.  Allergic/Immunologic: Negative for susceptible to infections.  Neurological:  Positive for weakness. Negative for dizziness, numbness, headaches and memory loss.  Hematological:  Negative for bruising/bleeding tendency.  Psychiatric/Behavioral:  Negative for confusion.    PMFS History:  Patient Active Problem List   Diagnosis Date Noted   Pain in right shoulder 10/29/2020   Low back pain 11/26/2019   Impingement syndrome of right shoulder 11/26/2019   Belching 04/17/2019   Screening for viral disease 04/17/2019   Other fatigue 06/13/2016   Primary insomnia 06/13/2016   Primary osteoarthritis of both hands 06/13/2016   Trochanteric bursitis of both hips 06/13/2016   DDD cervical spine status post fusion 06/13/2016   Facet arthritis, degenerative, lumbar spine 06/13/2016   Tendinopathy of right rotator cuff 06/13/2016   Chest pain, atypical 02/25/2015   Abnormal nuclear stress test 02/25/2015   Hyperlipidemia 07/17/2013   Osteopenia 01/28/2012   OPIOID DEPENDENCE 03/09/2010   CELIAC SPRUE 09/29/2009   Hypothyroidism 09/07/2009   Meniere's disease 09/07/2009   Essential hypertension 09/07/2009   RUQ PAIN 09/07/2009   Fibromyalgia 06/12/2009   CERVICALGIA 06/03/2009   ANEMIA-IRON DEFICIENCY 10/21/2008   Gastroesophageal reflux disease 10/21/2008   DIZZINESS 10/21/2008   COLONIC POLYPS, HX OF 10/21/2008    Past Medical History:  Diagnosis Date   Allergy    sinus infections   Anemia    Arthritis  Celiac disease    Fibromyalgia    GERD (gastroesophageal reflux disease)    Heart murmur    Hiatal hernia    Hyperlipidemia    Hypertension    Meniere's disease    Neuromuscular disorder ([place])    fibromyalgia   Thyroid disease    hypothyroid   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    Family History  Problem Relation Age of Onset   Celiac disease Mother    Diabetes Mother     Heart disease Mother    Prostate cancer Father    Breast cancer Maternal Aunt    Diabetes Sister    Meniere's disease Sister    Celiac disease Sister    Diabetes Sister    Colon cancer Neg Hx    Esophageal cancer Neg Hx    Stomach cancer Neg Hx    Rectal cancer Neg Hx    Liver cancer Neg Hx    Pancreatic cancer Neg Hx    Past Surgical History:  Procedure Laterality Date   CARDIAC CATHETERIZATION N/A 02/25/2015   Procedure: Left Heart Cath and Coronary Angiography;  Surgeon: Peter M Martinique, MD;  Location: [facility];  Service: Cardiovascular;  Laterality: N/A;   CERVICAL DISC SURGERY     CHOLECYSTECTOMY     COLONOSCOPY     DILATION AND CURETTAGE OF UTERUS     HAND SURGERY Right    right hand; calcium deposit on wrist and under thumb and ganglion cyst and carpal tunnel   lumbar radiofrequency nerve ablation     twice yearly   MOUTH SURGERY  2016   couple times due to gum disease   ROTATOR CUFF REPAIR Left 06/07/2015   left shoulder   TOTAL ABDOMINAL HYSTERECTOMY     UPPER GASTROINTESTINAL ENDOSCOPY     Social History   Social History Narrative   Not on file   Immunization History  Administered Date(s) Administered   PFIZER(Purple Top)SARS-COV-2 Vaccination 01/06/2020, 01/31/2020   Pneumococcal Conjugate-13 07/17/2013   Tdap 06/26/2013   Zoster, Live 07/17/2013     Objective: Vital Signs: BP 131/82 (BP Location: Left Arm, Patient Position: Sitting, Cuff Size: Normal)   Pulse 66   Ht 5' 3"  (1.6 m)   Wt 152 lb 12.8 oz (69.3 kg)   BMI 27.07 kg/m    Physical Exam Vitals and nursing note reviewed.  Constitutional:      Appearance: She is well-developed.  HENT:     Head: Normocephalic and atraumatic.  Eyes:     Conjunctiva/sclera: Conjunctivae normal.  Pulmonary:     Effort: Pulmonary effort is normal.  Abdominal:     Palpations: Abdomen is soft.  Musculoskeletal:     Cervical back: Normal range of motion.  Skin:    General: Skin is warm and dry.      Capillary Refill: Capillary refill takes less than 2 seconds.  Neurological:     Mental Status: She is alert and oriented to person, place, and time.  Psychiatric:        Behavior: Behavior normal.     Musculoskeletal Exam: C-spine has good ROM with no discomfort.  Painful ROM of lumbar spine.  No SI joint tenderness.  Tenderness in the lumbar region.  Shoulder joints have good range of motion with some discomfort in the right shoulder.  Elbow joints, wrist joints, MCPs, PIPs, DIPs have good range of motion with no synovitis.  PIP and DIP thickening consistent with osteoarthritis of both hands.  Complete fist formation  bilaterally.  Hip joints have good range of motion with no discomfort.  Knee joints have good range of motion with no warmth or effusion.  Ankle joints have good range of motion with no tenderness or joint swelling.  CDAI Exam: CDAI Score: -- Patient Global: --; Provider Global: -- Swollen: --; Tender: -- Joint Exam 04/20/2021   No joint exam has been documented for this visit   There is currently no information documented on the homunculus. Go to the Rheumatology activity and complete the homunculus joint exam.  Investigation: No additional findings.  Imaging: No results found.  Recent Labs: Lab Results  Component Value Date   WBC 6.7 10/16/2020   HGB 13.4 10/16/2020   PLT 184.0 10/16/2020   NA 138 10/16/2020   K 4.4 10/16/2020   CL 100 10/16/2020   CO2 30 10/16/2020   GLUCOSE 104 (H) 10/16/2020   BUN 18 10/16/2020   CREATININE 0.89 10/16/2020   BILITOT 0.4 10/16/2020   ALKPHOS 37 (L) 10/16/2020   AST 25 10/16/2020   ALT 22 10/16/2020   PROT 7.4 10/16/2020   ALBUMIN 4.7 10/16/2020   CALCIUM 10.8 (H) 10/16/2020   GFRAA 85 12/12/2016    Speciality Comments: No specialty comments available.  Procedures:  No procedures performed Allergies: Crestor [rosuvastatin], Lipitor [atorvastatin calcium], Nsaids, Nortriptyline, Nortriptyline hcl, and Vicodin  [hydrocodone-acetaminophen]   Assessment / Plan:     Visit Diagnoses: Fibromyalgia: Her last fibromyalgia flare was in August 2022 which lasted for about 1 week.  At that time she was having generalized hyperalgesia and muscle spasms.  Overall her fibromyalgia has been well controlled since then.  She takes Flexeril 5 to 10 mg at bedtime as needed for muscle spasms.  A refill was sent to the pharmacy today.  Discussed the importance of regular exercise and good sleep hygiene.  She will follow-up in the office in 6 months.  Trochanteric bursitis of both hips: She has some tenderness over bilateral trochanteric bursa.  Discussed the importance of performing stretching exercises on a daily basis.  Primary osteoarthritis of both hands: She has PIP and DIP thickening consistent with osteoarthritis of both hands.  No tenderness or inflammation was noted on examination.  Complete fist formation bilaterally.  Discussed the importance of joint protection and muscle strengthening.  Tendinopathy of right rotator cuff: She has painful range of motion on examination today.  She plans on following up with Dr. Durward Fortes for further evaluation and management if her symptoms persist or worsen.  DDD (degenerative disc disease), cervical - s/p fusion: She has good range of motion on examination today with no increased discomfort or stiffness.  No symptoms of radiculopathy at this time.  DDD (degenerative disc disease), lumbar - She was previously followed by Dr. Maryjean Ka and would have spinal nerve ablations every 6 months.  She is now on Medicare and she is only been able to have an ablation on a yearly basis.  She has been experiencing increased lower back pain over the past several months.  She has had symptoms of sciatica and both lower extremities intermittently.  She is also been experiencing more frequent symptoms of muscle spasms.  She has been taking Flexeril 5 to 10 mg at bedtime for muscle spasms.  A refill was  sent to the pharmacy today.  She has painful ROM of the lumbar spine on exam.  Midline spinal tenderness noted in the lumbar region. She plans on calling Dr. Ronnald Ramp today for further evaluation and management.  She  has been trying to perform home exercises but feels that her pain has progressively been worsening which has concerned her.  She would like to proceed with an MRI of the lumbar spine for further evaluation.  She is open to physical therapy in the future.  Other fatigue: Chronic and secondary to insomnia.  Discussed the importance of regular exercise.  Primary insomnia - She takes Flexeril 5 mg a mouth at bedtime to help with muscle spasms and insomnia.  Discussed the importance of good sleep hygiene.  Osteopenia of multiple sites - DEXA on 07/19/2018 right femoral neck BMD 0.814 with T score -0.3.   Other medical conditions are listed as follows:  History of Meniere's disease  History of hyperlipidemia  History of anemia  History of gastroesophageal reflux (GERD)  History of hypothyroidism  History of hypertension: Blood pressure was 131/82 today in the office.  Orders: No orders of the defined types were placed in this encounter.  Meds ordered this encounter  Medications   cyclobenzaprine (FLEXERIL) 5 MG tablet    Sig: TAKE 1 TABLET(5 MG) BY MOUTH AT BEDTIME AS NEEDED FOR MUSCLE SPASMS    Dispense:  30 tablet    Refill:  2    Follow-Up Instructions: Return in about 6 months (around 10/18/2021) for Fibromyalgia, DDD.   Ofilia Neas, PA-C  Note - This record has been created using Dragon software.  Chart creation errors have been sought, but may not always  have been located. Such creation errors do not reflect on  the standard of medical care.

## 2021-04-20 ENCOUNTER — Ambulatory Visit: Admitting: Rheumatology

## 2021-04-20 ENCOUNTER — Encounter: Payer: Self-pay | Admitting: Physician Assistant

## 2021-04-20 ENCOUNTER — Other Ambulatory Visit: Payer: Self-pay

## 2021-04-20 ENCOUNTER — Ambulatory Visit (INDEPENDENT_AMBULATORY_CARE_PROVIDER_SITE_OTHER): Payer: Medicare Other | Admitting: Physician Assistant

## 2021-04-20 VITALS — BP 131/82 | HR 66 | Ht 63.0 in | Wt 152.8 lb

## 2021-04-20 DIAGNOSIS — Z862 Personal history of diseases of the blood and blood-forming organs and certain disorders involving the immune mechanism: Secondary | ICD-10-CM

## 2021-04-20 DIAGNOSIS — M7061 Trochanteric bursitis, right hip: Secondary | ICD-10-CM | POA: Diagnosis not present

## 2021-04-20 DIAGNOSIS — M797 Fibromyalgia: Secondary | ICD-10-CM

## 2021-04-20 DIAGNOSIS — M8589 Other specified disorders of bone density and structure, multiple sites: Secondary | ICD-10-CM

## 2021-04-20 DIAGNOSIS — M19041 Primary osteoarthritis, right hand: Secondary | ICD-10-CM | POA: Diagnosis not present

## 2021-04-20 DIAGNOSIS — M7062 Trochanteric bursitis, left hip: Secondary | ICD-10-CM

## 2021-04-20 DIAGNOSIS — M19042 Primary osteoarthritis, left hand: Secondary | ICD-10-CM

## 2021-04-20 DIAGNOSIS — R5383 Other fatigue: Secondary | ICD-10-CM

## 2021-04-20 DIAGNOSIS — F5101 Primary insomnia: Secondary | ICD-10-CM

## 2021-04-20 DIAGNOSIS — Z8679 Personal history of other diseases of the circulatory system: Secondary | ICD-10-CM

## 2021-04-20 DIAGNOSIS — M5030 Other cervical disc degeneration, unspecified cervical region: Secondary | ICD-10-CM

## 2021-04-20 DIAGNOSIS — M67911 Unspecified disorder of synovium and tendon, right shoulder: Secondary | ICD-10-CM

## 2021-04-20 DIAGNOSIS — Z8669 Personal history of other diseases of the nervous system and sense organs: Secondary | ICD-10-CM

## 2021-04-20 DIAGNOSIS — M5136 Other intervertebral disc degeneration, lumbar region: Secondary | ICD-10-CM

## 2021-04-20 DIAGNOSIS — Z8719 Personal history of other diseases of the digestive system: Secondary | ICD-10-CM

## 2021-04-20 DIAGNOSIS — Z8639 Personal history of other endocrine, nutritional and metabolic disease: Secondary | ICD-10-CM

## 2021-04-20 MED ORDER — CYCLOBENZAPRINE HCL 5 MG PO TABS: ORAL_TABLET | ORAL | 2 refills | Status: DC

## 2021-04-21 ENCOUNTER — Telehealth: Payer: Self-pay | Admitting: Family Medicine

## 2021-04-21 DIAGNOSIS — E038 Other specified hypothyroidism: Secondary | ICD-10-CM

## 2021-04-21 NOTE — Telephone Encounter (Signed)
Patient called because she had cancelled her lab appointment because she is getting labs done in a week or two for her Cardiologist. Patient would like to know what labs Dr.Burchette wanted so she can see if it can be done at the Cardiologist or if she needs to schedule another appointment here.    Good callback number is [phone]     Please advise

## 2021-04-22 ENCOUNTER — Other Ambulatory Visit

## 2021-04-22 NOTE — Telephone Encounter (Signed)
Please advise 

## 2021-04-22 NOTE — Telephone Encounter (Signed)
She is a patient of Dr. Elease Hashimoto

## 2021-04-23 NOTE — Addendum Note (Signed)
Addended by: Nilda Riggs on: 04/23/2021 08:02 AM   Modules accepted: Orders

## 2021-04-23 NOTE — Telephone Encounter (Signed)
I spoke with the pt and lab appt scheduled and orders placed.

## 2021-04-26 ENCOUNTER — Other Ambulatory Visit (INDEPENDENT_AMBULATORY_CARE_PROVIDER_SITE_OTHER): Payer: Medicare Other

## 2021-04-26 DIAGNOSIS — E038 Other specified hypothyroidism: Secondary | ICD-10-CM

## 2021-04-26 LAB — T4, FREE: Free T4: 0.93 ng/dL (ref 0.60–1.60)

## 2021-04-26 LAB — TSH: TSH: 2.62 u[IU]/mL (ref 0.35–5.50)

## 2021-04-30 ENCOUNTER — Other Ambulatory Visit: Payer: Self-pay | Admitting: Family Medicine

## 2021-05-05 ENCOUNTER — Other Ambulatory Visit: Payer: Self-pay | Admitting: Family Medicine

## 2021-05-05 ENCOUNTER — Other Ambulatory Visit: Payer: Self-pay | Admitting: Cardiovascular Disease

## 2021-05-21 ENCOUNTER — Other Ambulatory Visit: Payer: Self-pay | Admitting: Neurological Surgery

## 2021-05-21 DIAGNOSIS — M5416 Radiculopathy, lumbar region: Secondary | ICD-10-CM

## 2021-06-08 ENCOUNTER — Ambulatory Visit
Admission: RE | Admit: 2021-06-08 | Discharge: 2021-06-08 | Disposition: A | Discharge status: Home / Self Care | Payer: Medicare Other | Source: Ambulatory Visit | Attending: Neurological Surgery | Admitting: Neurological Surgery

## 2021-06-08 ENCOUNTER — Other Ambulatory Visit: Payer: Self-pay

## 2021-06-08 DIAGNOSIS — M5416 Radiculopathy, lumbar region: Secondary | ICD-10-CM

## 2021-06-21 ENCOUNTER — Telehealth: Payer: Self-pay | Admitting: *Deleted

## 2021-06-21 NOTE — Telephone Encounter (Signed)
Please make the patient aware of this alert we have received from her insurance regarding taking flexeril over the age of 75.  Please make sure the patient is aware of these risks and encourage her to take flexeril PRN.   Patient advised and expressed understanding.

## 2021-07-12 ENCOUNTER — Other Ambulatory Visit: Payer: Self-pay | Admitting: Family Medicine

## 2021-07-17 ENCOUNTER — Other Ambulatory Visit: Payer: Self-pay | Admitting: Physician Assistant

## 2021-07-19 ENCOUNTER — Telehealth: Payer: Self-pay | Admitting: Acute Care

## 2021-07-19 NOTE — Telephone Encounter (Signed)
Called and spoke with pt regarding scheduling her lung screening CT scan. Pt has already had her shared decision making visit. Pt states she is still not sure she wants to schedule this. I advised pt that when she is ready she can let her PCP know and they can send a new referral to our office and we can get her scheduled. Pt verbalized understanding. Nothing further needed at this time.

## 2021-07-19 NOTE — Telephone Encounter (Signed)
Next Visit: 10/19/2021  Last Visit: 04/20/2021  Last Fill: 04/20/2021  Dx: Fibromyalgia  Current Dose per office note on 04/20/2021: Flexeril 5 to 10 mg at bedtime as needed for muscle spasms  Okay to refill Flexeril?

## 2021-08-04 ENCOUNTER — Other Ambulatory Visit: Payer: Self-pay | Admitting: Family Medicine

## 2021-08-19 ENCOUNTER — Encounter: Payer: Self-pay | Admitting: Family Medicine

## 2021-08-19 ENCOUNTER — Telehealth (INDEPENDENT_AMBULATORY_CARE_PROVIDER_SITE_OTHER): Payer: Medicare Other | Admitting: Family Medicine

## 2021-08-19 DIAGNOSIS — R519 Headache, unspecified: Secondary | ICD-10-CM

## 2021-08-19 DIAGNOSIS — R0981 Nasal congestion: Secondary | ICD-10-CM | POA: Diagnosis not present

## 2021-08-19 MED ORDER — AMOXICILLIN-POT CLAVULANATE 875-125 MG PO TABS: 1.0000 | ORAL_TABLET | Freq: Two times a day (BID) | ORAL | 0 refills | Status: DC

## 2021-08-19 NOTE — Patient Instructions (Addendum)
-  I sent the medication(s) we discussed to your pharmacy: ?Meds ordered this encounter  ?Medications  ? amoxicillin-clavulanate (AUGMENTIN) 875-125 MG tablet  ?  Sig: Take 1 tablet by mouth 2 (two) times daily.  ?  Dispense:  20 tablet  ?  Refill:  0  ? ?Nasal saline sinus rinses twice daily for 1 week.  ? ?I hope you are feeling better soon! ? ?Seek in person care promptly if your symptoms worsen, new concerns arise or you are not improving with treatment. ? ?It was nice to meet you today. I help [facility] out with telemedicine visits on Tuesdays and Thursdays and am happy to help if you need a virtual follow up visit on those days. Otherwise, if you have any concerns or questions following this visit please schedule a follow up visit with your Primary Care office or seek care at a local urgent care clinic to avoid delays in care ? ?

## 2021-08-19 NOTE — Progress Notes (Signed)
Virtual Visit via Video Note ? ?I connected with Kimberly Stein ? on 08/19/21 at 10:00 AM EDT by a video enabled telemedicine application and verified that I am speaking with the correct person using two identifiers. ? Location patient: [place] ?Location provider:work or home office ?Persons participating in the virtual visit: patient, provider ? ?I discussed the limitations and requested verbal permission for telemedicine visit. The patient expressed understanding and agreed to proceed. ? ? ?HPI: ? ?Acute telemedicine visit for a "sinus infection": ?-reports has history of recurrent sinus infections in the past - they went away for several years, but she had another sinus infection about 1 year ago and required abx, on and off issues for a few months ?-Onset: worse sinus issues the last few weeks ?-Symptoms include: pressure and pain in the maxillary sinus area, teeth sensitive, thick yellow mucus from the nose, stuffy nose ?-Denies: fever, severe HA, CP, SOB, vomiting, diarrhea ?-Has tried: afrin ?-Pertinent past medical history: see below ?-Pertinent medication allergies: ?Allergies  ?Allergen Reactions  ? Crestor [Rosuvastatin] Other (See Comments)  ?  Bilateral Leg pain  ? Lipitor [Atorvastatin Calcium]   ?  Muscle aches  ? Nsaids   ? Nortriptyline Rash  ? Nortriptyline Hcl Rash  ?  Red rash  ? Vicodin [Hydrocodone-Acetaminophen] Itching  ?-COVID-19 vaccine status:  ?Immunization History  ?Administered Date(s) Administered  ? PFIZER(Purple Top)SARS-COV-2 Vaccination 01/06/2020, 01/31/2020  ? Pneumococcal Conjugate-13 07/17/2013  ? Tdap 06/26/2013  ? Zoster, Live 07/17/2013  ? ? ? ?ROS: See pertinent positives and negatives per HPI. ? ?Past Medical History:  ?Diagnosis Date  ? Allergy   ? sinus infections  ? Anemia   ? Arthritis   ? Celiac disease   ? Fibromyalgia   ? GERD (gastroesophageal reflux disease)   ? Heart murmur   ? Hiatal hernia   ? Hyperlipidemia   ? Hypertension   ? Meniere's disease   ? Neuromuscular disorder  ([place])   ? fibromyalgia  ? Thyroid disease   ? hypothyroid  ? 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 ? ?Past Surgical History:  ?Procedure Laterality Date  ? CARDIAC CATHETERIZATION N/A 02/25/2015  ? Procedure: Left Heart Cath and Coronary Angiography;  Surgeon: Peter M Martinique, MD;  Location: [facility];  Service: Cardiovascular;  Laterality: N/A;  ? CERVICAL DISC SURGERY    ? CHOLECYSTECTOMY    ? COLONOSCOPY    ? DILATION AND CURETTAGE OF UTERUS    ? HAND SURGERY Right   ? right hand; calcium deposit on wrist and under thumb and ganglion cyst and carpal tunnel  ? lumbar radiofrequency nerve ablation    ? twice yearly  ? MOUTH SURGERY  2016  ? couple times due to gum disease  ? ROTATOR CUFF REPAIR Left 06/07/2015  ? left shoulder  ? TOTAL ABDOMINAL HYSTERECTOMY    ? UPPER GASTROINTESTINAL ENDOSCOPY    ? ? ? ?Current Outpatient Medications:  ?  albuterol (VENTOLIN HFA) 108 (90 Base) MCG/ACT inhaler, INHALE 2 PUFFS INTO THE LUNGS TWICE DAILY, Disp: 18 g, Rfl: 1 ?  amoxicillin-clavulanate (AUGMENTIN) 875-125 MG tablet, Take 1 tablet by mouth 2 (two) times daily., Disp: 20 tablet, Rfl: 0 ?  Calcium Carb-Cholecalciferol (CALCIUM 600 + D PO), Take by mouth 2 (two) times daily., Disp: , Rfl:  ?  chlorthalidone (HYGROTON) 25 MG tablet, TAKE 1 TABLET(25 MG) BY MOUTH DAILY, Disp: 90 tablet, Rfl: 0 ?  cyclobenzaprine (FLEXERIL) 5 MG tablet, TAKE 1 TABLET(5 MG) BY MOUTH AT  BEDTIME AS NEEDED FOR MUSCLE SPASMS, Disp: 30 tablet, Rfl: 2 ?  FENOFIBRATE PO, Take by mouth daily., Disp: , Rfl:  ?  FIBER PO, Take by mouth in the morning and at bedtime., Disp: , Rfl:  ?  levothyroxine (SYNTHROID) 150 MCG tablet, TAKE 1 TABLET BY MOUTH EVERY DAY BEFORE BREAKFAST, Disp: 90 tablet, Rfl: 0 ?  losartan (COZAAR) 50 MG tablet, TAKE 1 TABLET(50 MG) BY MOUTH DAILY, Disp: 90 tablet, Rfl: 1 ?  MAGNESIUM PO, Take by mouth daily., Disp: , Rfl:  ?  Multiple Vitamin (MULTIVITAMIN PO), Take by mouth daily., Disp: , Rfl:  ?  omeprazole (PRILOSEC)  40 MG capsule, TAKE 1 CAPSULE BY MOUTH EVERY DAY 30 TO 60 MINUTES BEFORE DINNER, Disp: 90 capsule, Rfl: 1 ?  potassium chloride (KLOR-CON) 10 MEQ tablet, TAKE 1 TABLET(10 MEQ) BY MOUTH DAILY, Disp: 90 tablet, Rfl: 3 ?  PSYLLIUM HUSK PO, Take by mouth. Take 3 pills daily, Disp: , Rfl:  ? ?EXAM: ? ?VITALS per patient if applicable: 95.0 ? ?GENERAL: alert, oriented, appears well and in no acute distress ? ?HEENT: atraumatic, conjunttiva clear, no obvious abnormalities on inspection of external nose and ears ? ?NECK: normal movements of the head and neck ? ?LUNGS: on inspection no signs of respiratory distress, breathing rate appears normal, no obvious gross SOB, gasping or wheezing ? ?CV: no obvious cyanosis ? ?MS: moves all visible extremities without noticeable abnormality ? ?PSYCH/NEURO: pleasant and cooperative, no obvious depression or anxiety, speech and thought processing grossly intact ? ?ASSESSMENT AND PLAN: ? ?Discussed the following assessment and plan: ? ?Nasal congestion ? ?-we discussed possible serious and likely etiologies, options for evaluation and workup, limitations of telemedicine visit vs in person visit, treatment, treatment risks and precautions. Pt is agreeable to treatment via telemedicine at this moment. Suspect sinusitis. Discussed treatment options and she agrees to try nasal saline rinses and wants to try Augmentin 875 bid x 7-10 days.  ?Advised to seek prompt virtual visit or in person care if worsening, new symptoms arise, or if is not improving with treatment as expected per our conversation of expected course. Discussed options for follow up care. Did let this patient know that I do telemedicine on Tuesdays and Thursdays for [facility] and those are the days I am logged into the system. Advised to schedule follow up visit with PCP,  virtual visits or [facility] if any further questions or concerns to avoid delays in care. ?  ?I discussed the assessment and treatment plan with the  patient. The patient was provided an opportunity to ask questions and all were answered. The patient agreed with the plan and demonstrated an understanding of the instructions. ?  ? ? ?Lucretia Kern, DO  ? ?

## 2021-10-05 NOTE — Progress Notes (Unsigned)
Office Visit Note  Patient: Kimberly Stein             Date of Birth: 26-Oct-1955           MRN: [id number]             PCP: Eulas Post, MD Referring: Eulas Post, MD Visit Date: 10/19/2021 Occupation: @GUAROCC @  Subjective:  Left trochanteric bursitis  History of Present Illness: Kimberly Stein is a 66 y.o. female with history of fibromyalgia and DDD.  Patient continues to experience intermittent myalgias and muscle tenderness due to fibromyalgia.  Overall her symptoms have been tolerable.  She denies any recent flareups.  Her symptoms have been manageable taking Flexeril 5 mg at bedtime.  She is tolerating Flexeril without any excessive drowsiness or dizziness.  She requested a refill of Flexeril to be sent to the pharmacy today.  She states that she continues to try to remain active.  She is not walking on a daily basis as well as performing stretching exercises.  She continues to experience intermittent discomfort due to left trochanter bursitis.  She denies any groin pain currently.  She has occasional discomfort in both hands but denies any joint swelling.  She has not had any recent falls or fractures.  She continues to take a calcium vitamin D supplement daily.  She has an upcoming physical scheduled by Dr. Elease Hashimoto.  Activities of Daily Living:  Patient reports morning stiffness for 1 hour.   Patient Reports nocturnal pain.  Difficulty dressing/grooming: Denies Difficulty climbing stairs: Denies Difficulty getting out of chair: Denies Difficulty using hands for taps, buttons, cutlery, and/or writing: Denies  Review of Systems  Constitutional:  Positive for fatigue.  HENT:  Positive for mouth dryness and nose dryness. Negative for mouth sores.   Eyes:  Negative for pain, itching and dryness.  Respiratory:  Negative for shortness of breath and difficulty breathing.   Cardiovascular:  Negative for chest pain and palpitations.  Gastrointestinal:  Negative for  blood in stool, constipation and diarrhea.  Endocrine: Negative for increased urination.  Genitourinary:  Negative for difficulty urinating.  Musculoskeletal:  Positive for joint pain, joint pain, myalgias, morning stiffness, muscle tenderness and myalgias. Negative for joint swelling.  Skin:  Negative for color change, rash and redness.  Allergic/Immunologic: Negative for susceptible to infections.  Neurological:  Negative for dizziness, numbness, headaches and memory loss.  Hematological:  Negative for bruising/bleeding tendency.  Psychiatric/Behavioral:  Negative for confusion.    PMFS History:  Patient Active Problem List   Diagnosis Date Noted   Pain in right shoulder 10/29/2020   Low back pain 11/26/2019   Impingement syndrome of right shoulder 11/26/2019   Belching 04/17/2019   Screening for viral disease 04/17/2019   Other fatigue 06/13/2016   Primary insomnia 06/13/2016   Primary osteoarthritis of both hands 06/13/2016   Trochanteric bursitis of both hips 06/13/2016   DDD cervical spine status post fusion 06/13/2016   Facet arthritis, degenerative, lumbar spine 06/13/2016   Tendinopathy of right rotator cuff 06/13/2016   Chest pain, atypical 02/25/2015   Abnormal nuclear stress test 02/25/2015   Hyperlipidemia 07/17/2013   Osteopenia 01/28/2012   OPIOID DEPENDENCE 03/09/2010   CELIAC SPRUE 09/29/2009   Hypothyroidism 09/07/2009   Meniere's disease 09/07/2009   Essential hypertension 09/07/2009   RUQ PAIN 09/07/2009   Fibromyalgia 06/12/2009   CERVICALGIA 06/03/2009   ANEMIA-IRON DEFICIENCY 10/21/2008   Gastroesophageal reflux disease 10/21/2008   DIZZINESS 10/21/2008   COLONIC POLYPS,  HX OF 10/21/2008    Past Medical History:  Diagnosis Date   Allergy    sinus infections   Anemia    Arthritis    Celiac disease    Fibromyalgia    GERD (gastroesophageal reflux disease)    Heart murmur    Hiatal hernia    Hyperlipidemia    Hypertension    Meniere's disease     Neuromuscular disorder ([place])    fibromyalgia   Thyroid disease    hypothyroid   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    Family History  Problem Relation Age of Onset   Celiac disease Mother    Diabetes Mother    Heart disease Mother    Prostate cancer Father    Breast cancer Maternal Aunt    Diabetes Sister    Meniere's disease Sister    Celiac disease Sister    Diabetes Sister    Colon cancer Neg Hx    Esophageal cancer Neg Hx    Stomach cancer Neg Hx    Rectal cancer Neg Hx    Liver cancer Neg Hx    Pancreatic cancer Neg Hx    Past Surgical History:  Procedure Laterality Date   CARDIAC CATHETERIZATION N/A 02/25/2015   Procedure: Left Heart Cath and Coronary Angiography;  Surgeon: Peter M Martinique, MD;  Location: [facility];  Service: Cardiovascular;  Laterality: N/A;   CERVICAL DISC SURGERY     CHOLECYSTECTOMY     COLONOSCOPY     DILATION AND CURETTAGE OF UTERUS     HAND SURGERY Right    right hand; calcium deposit on wrist and under thumb and ganglion cyst and carpal tunnel   lumbar radiofrequency nerve ablation     twice yearly   MOUTH SURGERY  2016   couple times due to gum disease   ROTATOR CUFF REPAIR Left 06/07/2015   left shoulder   TOTAL ABDOMINAL HYSTERECTOMY     UPPER GASTROINTESTINAL ENDOSCOPY     Social History   Social History Narrative   Not on file   Immunization History  Administered Date(s) Administered   PFIZER(Purple Top)SARS-COV-2 Vaccination 01/06/2020, 01/31/2020   Pneumococcal Conjugate-13 07/17/2013   Tdap 06/26/2013   Zoster, Live 07/17/2013     Objective: Vital Signs: BP 125/80 (BP Location: Left Arm, Patient Position: Sitting, Cuff Size: Normal)   Pulse 65   Ht 5' 3"  (1.6 m)   Wt 151 lb 9.6 oz (68.8 kg)   BMI 26.85 kg/m    Physical Exam Vitals and nursing note reviewed.  Constitutional:      Appearance: She is well-developed.  HENT:     Head: Normocephalic and atraumatic.  Eyes:     Conjunctiva/sclera:  Conjunctivae normal.  Cardiovascular:     Rate and Rhythm: Normal rate and regular rhythm.     Heart sounds: Normal heart sounds.  Pulmonary:     Effort: Pulmonary effort is normal.     Breath sounds: Normal breath sounds.  Abdominal:     General: Bowel sounds are normal.     Palpations: Abdomen is soft.  Musculoskeletal:     Cervical back: Normal range of motion.  Skin:    General: Skin is warm and dry.     Capillary Refill: Capillary refill takes less than 2 seconds.  Neurological:     Mental Status: She is alert and oriented to person, place, and time.  Psychiatric:        Behavior: Behavior normal.     Musculoskeletal  Exam: C-spine has good range of motion.  Trapezius muscle tension and tenderness bilaterally.  No midline spinal tenderness or SI joint tenderness upon palpation.  Shoulder joints, elbow joints, wrist joints, MCPs, PIPs, DIPs have good range of motion with no synovitis.  She has PIP and DIP thickening consistent with osteoarthritis of both hands.  Complete fist formation bilaterally.  Hip joints have good range of motion with no groin pain.  Tenderness palpation over the left trochanteric bursa.  Knee joints have good range of motion with no warmth or effusion.  Ankle joints have good range of motion with no tenderness or joint swelling.  CDAI Exam: CDAI Score: -- Patient Global: --; Provider Global: -- Swollen: --; Tender: -- Joint Exam 10/19/2021   No joint exam has been documented for this visit   There is currently no information documented on the homunculus. Go to the Rheumatology activity and complete the homunculus joint exam.  Investigation: No additional findings.  Imaging: No results found.  Recent Labs: Lab Results  Component Value Date   WBC 6.7 10/16/2020   HGB 13.4 10/16/2020   PLT 184.0 10/16/2020   NA 138 10/16/2020   K 4.4 10/16/2020   CL 100 10/16/2020   CO2 30 10/16/2020   GLUCOSE 104 (H) 10/16/2020   BUN 18 10/16/2020    CREATININE 0.89 10/16/2020   BILITOT 0.4 10/16/2020   ALKPHOS 37 (L) 10/16/2020   AST 25 10/16/2020   ALT 22 10/16/2020   PROT 7.4 10/16/2020   ALBUMIN 4.7 10/16/2020   CALCIUM 10.8 (H) 10/16/2020   GFRAA 85 12/12/2016    Speciality Comments: No specialty comments available.  Procedures:  No procedures performed Allergies: Crestor [rosuvastatin], Lipitor [atorvastatin calcium], Nsaids, Nortriptyline, Nortriptyline hcl, and Vicodin [hydrocodone-acetaminophen]   Assessment / Plan:     Visit Diagnoses: Fibromyalgia: She has not had any recent fibromyalgia flares.  She has occasional myalgias and muscle tenderness but overall her symptoms have been manageable.  She has been taking Flexeril 5 mg at bedtime which alleviates her muscles tension, spasms, and helps her sleep at night.  A refill of Flexeril was sent to the pharmacy today.  She remains active walking on a daily basis as well as performing stretching exercises daily.  Overall her energy level has been stable.  Discussed the importance of regular exercise and good sleep hygiene.  She will follow-up in the office in 6 months or sooner if needed.  Trochanteric bursitis of both hips: She presents today with some ongoing discomfort and tenderness over the left trochanteric bursa.  Overall her symptoms have been tolerable.  Discussed the importance of performing stretching exercises daily.  She was given a handout of exercises to perform.  She was advised to notify us if her symptoms persist or worsen at which time she can return for a left trochanteric bursa injection in the future if needed.   Primary osteoarthritis of both hands: She has PIP and DIP thickening consistent with osteoarthritis of both hands.  No tenderness or inflammation was noted on examination today.  No signs or symptoms of inflammatory arthritis were noted.  She was able to make a complete fist bilaterally.  Discussed the importance of joint protection and muscle  strengthening.  She was given a handout of hand exercises to perform.  Tendinopathy of right rotator cuff: Doing well.  She has good range of motion with no discomfort in the right shoulder at this time.  DDD (degenerative disc disease), cervical - s/p fusion: C-spine  has good range of motion with some discomfort with lateral rotation.  She has trapezius muscle tension and tenderness bilaterally.  No symptoms of radiculopathy.  DDD (degenerative disc disease), lumbar - She was previously followed by Dr. Maryjean Ka and would have spinal nerve ablations every 6 months.  Primary insomnia - She continues to take Flexeril 5 mg a mouth at bedtime to help with muscle spasms and insomnia.  She is tolerating Flexeril without any side effects.  She has not had any excessive drowsiness or dizziness while taking Flexeril.  She requested a refill to be sent to the pharmacy today.  Discussed the importance of regular exercise and good sleep hygiene.  Other fatigue: Chronic but stable.  Discussed the importance of daily exercise.  Osteopenia of multiple sites - DEXA on 07/19/2018 right femoral neck BMD 0.814 with T score -0.3.  She has not had any recent falls or fractures.  She is taking a calcium and vitamin D supplement daily.  Other medical conditions are listed as follows:  History of hyperlipidemia  History of Meniere's disease: Patient plans on reaching out to her PCP to discuss a new referral to ENT.  History of gastroesophageal reflux (GERD)  History of anemia  History of hypothyroidism  History of hypertension: Blood pressure was 125/80 today in the office.  Orders: No orders of the defined types were placed in this encounter.  Meds ordered this encounter  Medications   cyclobenzaprine (FLEXERIL) 5 MG tablet    Sig: TAKE 1 TABLET(5 MG) BY MOUTH AT BEDTIME AS NEEDED FOR MUSCLE SPASMS    Dispense:  30 tablet    Refill:  2     Follow-Up Instructions: Return in about 6 months (around  04/21/2022) for Fibromyalgia, DDD.   Ofilia Neas, PA-C  Note - This record has been created using Dragon software.  Chart creation errors have been sought, but may not always  have been located. Such creation errors do not reflect on  the standard of medical care.

## 2021-10-19 ENCOUNTER — Encounter: Payer: Self-pay | Admitting: Physician Assistant

## 2021-10-19 ENCOUNTER — Ambulatory Visit (INDEPENDENT_AMBULATORY_CARE_PROVIDER_SITE_OTHER): Payer: Medicare Other | Admitting: Physician Assistant

## 2021-10-19 ENCOUNTER — Ambulatory Visit: Payer: Medicare Other | Admitting: Rheumatology

## 2021-10-19 VITALS — BP 125/80 | HR 65 | Ht 63.0 in | Wt 151.6 lb

## 2021-10-19 DIAGNOSIS — M7061 Trochanteric bursitis, right hip: Secondary | ICD-10-CM | POA: Diagnosis not present

## 2021-10-19 DIAGNOSIS — Z8669 Personal history of other diseases of the nervous system and sense organs: Secondary | ICD-10-CM

## 2021-10-19 DIAGNOSIS — Z862 Personal history of diseases of the blood and blood-forming organs and certain disorders involving the immune mechanism: Secondary | ICD-10-CM

## 2021-10-19 DIAGNOSIS — M19041 Primary osteoarthritis, right hand: Secondary | ICD-10-CM | POA: Diagnosis not present

## 2021-10-19 DIAGNOSIS — M797 Fibromyalgia: Secondary | ICD-10-CM | POA: Diagnosis not present

## 2021-10-19 DIAGNOSIS — M67911 Unspecified disorder of synovium and tendon, right shoulder: Secondary | ICD-10-CM | POA: Diagnosis not present

## 2021-10-19 DIAGNOSIS — M7062 Trochanteric bursitis, left hip: Secondary | ICD-10-CM

## 2021-10-19 DIAGNOSIS — Z8639 Personal history of other endocrine, nutritional and metabolic disease: Secondary | ICD-10-CM

## 2021-10-19 DIAGNOSIS — M5030 Other cervical disc degeneration, unspecified cervical region: Secondary | ICD-10-CM

## 2021-10-19 DIAGNOSIS — M19042 Primary osteoarthritis, left hand: Secondary | ICD-10-CM

## 2021-10-19 DIAGNOSIS — R5383 Other fatigue: Secondary | ICD-10-CM

## 2021-10-19 DIAGNOSIS — M8589 Other specified disorders of bone density and structure, multiple sites: Secondary | ICD-10-CM

## 2021-10-19 DIAGNOSIS — M5136 Other intervertebral disc degeneration, lumbar region: Secondary | ICD-10-CM

## 2021-10-19 DIAGNOSIS — Z8719 Personal history of other diseases of the digestive system: Secondary | ICD-10-CM

## 2021-10-19 DIAGNOSIS — F5101 Primary insomnia: Secondary | ICD-10-CM

## 2021-10-19 DIAGNOSIS — Z8679 Personal history of other diseases of the circulatory system: Secondary | ICD-10-CM

## 2021-10-19 MED ORDER — CYCLOBENZAPRINE HCL 5 MG PO TABS: ORAL_TABLET | ORAL | 2 refills | Status: DC

## 2021-10-19 NOTE — Patient Instructions (Addendum)
Hip Bursitis Rehab Ask your health care provider which exercises are safe for you. Do exercises exactly as told by your health care provider and adjust them as directed. It is normal to feel mild stretching, pulling, tightness, or discomfort as you do these exercises. Stop right away if you feel sudden pain or your pain gets worse. Do not begin these exercises until told by your health care provider. Stretching exercise This exercise warms up your muscles and joints and improves the movement and flexibility of your hip. This exercise also helps to relieve pain and stiffness. Iliotibial band stretch An iliotibial band is a strong band of muscle tissue that runs from the outer side of your hip to the outer side of your thigh and knee. Lie on your side with your left / right leg in the top position. Bend your left / right knee and grab your ankle. Stretch out your bottom arm to help you balance. Slowly bring your knee back so your thigh is slightly behind your body. Slowly lower your knee toward the floor until you feel a gentle stretch on the outside of your left / right thigh. If you do not feel a stretch and your knee will not lower more toward the floor, place the heel of your other foot on top of your knee and pull your knee down toward the floor with your foot. Hold this position for __________ seconds. Slowly return to the starting position. Repeat __________ times. Complete this exercise __________ times a day. Strengthening exercises These exercises build strength and endurance in your hip and pelvis. Endurance is the ability to use your muscles for a long time, even after they get tired. Bridge This exercise strengthens the muscles that move your thigh backward (hip extensors). Lie on your back on a firm surface with your knees bent and your feet flat on the floor. Tighten your buttocks muscles and lift your buttocks off the floor until your trunk is level with your thighs. Do not arch your  back. You should feel the muscles working in your buttocks and the back of your thighs. If you do not feel these muscles, slide your feet 1-2 inches (2.5-5 cm) farther away from your buttocks. If this exercise is too easy, try doing it with your arms crossed over your chest. Hold this position for __________ seconds. Slowly lower your hips to the starting position. Let your muscles relax completely after each repetition. Repeat __________ times. Complete this exercise __________ times a day. Squats This exercise strengthens the muscles in front of your thigh and knee (quadriceps). Stand in front of a table, with your feet and knees pointing straight ahead. You may rest your hands on the table for balance but not for support. Slowly bend your knees and lower your hips like you are going to sit in a chair. Keep your weight over your heels, not over your toes. Keep your lower legs upright so they are parallel with the table legs. Do not let your hips go lower than your knees. Do not bend lower than told by your health care provider. If your hip pain increases, do not bend as low. Hold the squat position for __________ seconds. Slowly push with your legs to return to standing. Do not use your hands to pull yourself to standing. Repeat __________ times. Complete this exercise __________ times a day. Hip hike  Stand sideways on a bottom step. Stand on your left / right leg with your other foot unsupported next to  the step. You can hold on to the railing or wall for balance if needed. Keep your knees straight and your torso square. Then lift your left / right hip up toward the ceiling. Hold this position for __________ seconds. Slowly let your left / right hip lower toward the floor, past the starting position. Your foot should get closer to the floor. Do not lean or bend your knees. Repeat __________ times. Complete this exercise __________ times a day. Single leg stand This exercise increases  your balance. Without shoes, stand near a railing or in a doorway. You may hold on to the railing or door frame as needed for balance. Squeeze your left / right buttock muscles, then lift up your other foot. Do not let your left / right hip push out to the side. It is helpful to stand in front of a mirror for this exercise so you can watch your hip. Hold this position for __________ seconds. Repeat __________ times. Complete this exercise __________ times a day. This information is not intended to replace advice given to you by your health care provider. Make sure you discuss any questions you have with your health care provider. Document Revised: 04/21/2021 Document Reviewed: 04/21/2021 Elsevier Patient Education  [place].   Hand Exercises Hand exercises can be helpful for almost anyone. These exercises can strengthen the hands, improve flexibility and movement, and increase blood flow to the hands. These results can make work and daily tasks easier. Hand exercises can be especially helpful for people who have joint pain from arthritis or have nerve damage from overuse (carpal tunnel syndrome). These exercises can also help people who have injured a hand. Exercises Most of these hand exercises are gentle stretching and motion exercises. It is usually safe to do them often throughout the day. Warming up your hands before exercise may help to reduce stiffness. You can do this with gentle massage or by placing your hands in warm water for 10-15 minutes. It is normal to feel some stretching, pulling, tightness, or mild discomfort as you begin new exercises. This will gradually improve. Stop an exercise right away if you feel sudden, severe pain or your pain gets worse. Ask your health care provider which exercises are best for you. Knuckle bend or "claw" fist  Stand or sit with your arm, hand, and all five fingers pointed straight up. Make sure to keep your wrist straight during the  exercise. Gently bend your fingers down toward your palm until the tips of your fingers are touching the top of your palm. Keep your big knuckle straight and just bend the small knuckles in your fingers. Hold this position for __________ seconds. Straighten (extend) your fingers back to the starting position. Repeat this exercise 5-10 times with each hand. Full finger fist  Stand or sit with your arm, hand, and all five fingers pointed straight up. Make sure to keep your wrist straight during the exercise. Gently bend your fingers into your palm until the tips of your fingers are touching the middle of your palm. Hold this position for __________ seconds. Extend your fingers back to the starting position, stretching every joint fully. Repeat this exercise 5-10 times with each hand. Straight fist Stand or sit with your arm, hand, and all five fingers pointed straight up. Make sure to keep your wrist straight during the exercise. Gently bend your fingers at the big knuckle, where your fingers meet your hand, and the middle knuckle. Keep the knuckle at the  tips of your fingers straight and try to touch the bottom of your palm. Hold this position for __________ seconds. Extend your fingers back to the starting position, stretching every joint fully. Repeat this exercise 5-10 times with each hand. Tabletop  Stand or sit with your arm, hand, and all five fingers pointed straight up. Make sure to keep your wrist straight during the exercise. Gently bend your fingers at the big knuckle, where your fingers meet your hand, as far down as you can while keeping the small knuckles in your fingers straight. Think of forming a tabletop with your fingers. Hold this position for __________ seconds. Extend your fingers back to the starting position, stretching every joint fully. Repeat this exercise 5-10 times with each hand. Finger spread  Place your hand flat on a table with your palm facing down. Make  sure your wrist stays straight as you do this exercise. Spread your fingers and thumb apart from each other as far as you can until you feel a gentle stretch. Hold this position for __________ seconds. Bring your fingers and thumb tight together again. Hold this position for __________ seconds. Repeat this exercise 5-10 times with each hand. Making circles  Stand or sit with your arm, hand, and all five fingers pointed straight up. Make sure to keep your wrist straight during the exercise. Make a circle by touching the tip of your thumb to the tip of your index finger. Hold for __________ seconds. Then open your hand wide. Repeat this motion with your thumb and each finger on your hand. Repeat this exercise 5-10 times with each hand. Thumb motion  Sit with your forearm resting on a table and your wrist straight. Your thumb should be facing up toward the ceiling. Keep your fingers relaxed as you move your thumb. Lift your thumb up as high as you can toward the ceiling. Hold for __________ seconds. Bend your thumb across your palm as far as you can, reaching the tip of your thumb for the small finger (pinkie) side of your palm. Hold for __________ seconds. Repeat this exercise 5-10 times with each hand. Grip strengthening  Hold a stress ball or other soft ball in the middle of your hand. Slowly increase the pressure, squeezing the ball as much as you can without causing pain. Think of bringing the tips of your fingers into the middle of your palm. All of your finger joints should bend when doing this exercise. Hold your squeeze for __________ seconds, then relax. Repeat this exercise 5-10 times with each hand. Contact a health care provider if: Your hand pain or discomfort gets much worse when you do an exercise. Your hand pain or discomfort does not improve within 2 hours after you exercise. If you have any of these problems, stop doing these exercises right away. Do not do them again unless  your health care provider says that you can. Get help right away if: You develop sudden, severe hand pain or swelling. If this happens, stop doing these exercises right away. Do not do them again unless your health care provider says that you can. This information is not intended to replace advice given to you by your health care provider. Make sure you discuss any questions you have with your health care provider. Document Revised: 08/27/2020 Document Reviewed: 08/27/2020 Elsevier Patient Education  [place].

## 2021-10-20 ENCOUNTER — Other Ambulatory Visit: Payer: Self-pay | Admitting: Cardiovascular Disease

## 2021-10-26 ENCOUNTER — Other Ambulatory Visit: Payer: Self-pay | Admitting: Family Medicine

## 2021-10-31 ENCOUNTER — Other Ambulatory Visit: Payer: Self-pay | Admitting: Family Medicine

## 2021-11-01 ENCOUNTER — Other Ambulatory Visit: Payer: Self-pay | Admitting: Family Medicine

## 2021-11-29 ENCOUNTER — Other Ambulatory Visit: Payer: Self-pay | Admitting: Family Medicine

## 2021-12-03 ENCOUNTER — Other Ambulatory Visit: Payer: Self-pay | Admitting: Family Medicine

## 2021-12-03 ENCOUNTER — Telehealth: Payer: Self-pay | Admitting: Family Medicine

## 2021-12-03 MED ORDER — POTASSIUM CHLORIDE CRYS ER 10 MEQ PO TBCR: EXTENDED_RELEASE_TABLET | ORAL | 0 refills | Status: DC

## 2021-12-03 NOTE — Telephone Encounter (Signed)
Pt informed her rx was denied due to not being seen in over 1 year. Pt stated she would call on Monday to schedule an appointment. Temporary rx sent

## 2021-12-03 NOTE — Telephone Encounter (Signed)
Pt called to state her Rx refill for  potassium chloride (KLOR-CON M) 10 MEQ tablet  was denied and she would like a call back to discuss why.    Pt sates she can probably get it OTC, but would still like a call back      [place], [place] - [address] Phone:  [phone]  Fax:  [phone]

## 2021-12-06 ENCOUNTER — Other Ambulatory Visit: Payer: Self-pay | Admitting: Family Medicine

## 2021-12-06 DIAGNOSIS — Z1231 Encounter for screening mammogram for malignant neoplasm of breast: Secondary | ICD-10-CM

## 2021-12-13 ENCOUNTER — Other Ambulatory Visit: Payer: Self-pay | Admitting: Family Medicine

## 2021-12-15 ENCOUNTER — Other Ambulatory Visit: Payer: Self-pay | Admitting: Family Medicine

## 2021-12-15 ENCOUNTER — Other Ambulatory Visit: Payer: Self-pay

## 2021-12-15 ENCOUNTER — Encounter: Payer: Self-pay | Admitting: Family Medicine

## 2021-12-15 MED ORDER — LEVOTHYROXINE SODIUM 150 MCG PO TABS: ORAL_TABLET | ORAL | 0 refills | Status: DC

## 2021-12-15 NOTE — Telephone Encounter (Signed)
Error/njr °

## 2021-12-23 ENCOUNTER — Ambulatory Visit
Admission: RE | Admit: 2021-12-23 | Discharge: 2021-12-23 | Disposition: A | Discharge status: Home / Self Care | Payer: Medicare Other | Source: Ambulatory Visit | Attending: Family Medicine | Admitting: Family Medicine

## 2021-12-23 DIAGNOSIS — Z1231 Encounter for screening mammogram for malignant neoplasm of breast: Secondary | ICD-10-CM

## 2021-12-27 ENCOUNTER — Encounter: Payer: Medicare Other | Admitting: Family Medicine

## 2022-01-01 ENCOUNTER — Other Ambulatory Visit: Payer: Self-pay | Admitting: Family Medicine

## 2022-01-03 ENCOUNTER — Other Ambulatory Visit: Payer: Self-pay | Admitting: Family Medicine

## 2022-01-05 ENCOUNTER — Telehealth: Payer: Self-pay | Admitting: Family Medicine

## 2022-01-05 MED ORDER — LEVOTHYROXINE SODIUM 150 MCG PO TABS: ORAL_TABLET | ORAL | 0 refills | Status: DC

## 2022-01-05 MED ORDER — CHLORTHALIDONE 25 MG PO TABS: ORAL_TABLET | ORAL | 0 refills | Status: DC

## 2022-01-05 NOTE — Telephone Encounter (Signed)
Rx sent 

## 2022-01-05 NOTE — Telephone Encounter (Signed)
Pt requesting refill of chlorthalidone (HYGROTON) 25 MG tablet, levothyroxine (SYNTHROID) 150 MCG tablet   [id number] - [place], [place] - [address] Phone:  [phone]  Fax:  [phone]     Has CPE sched 01/21/22

## 2022-01-06 ENCOUNTER — Other Ambulatory Visit: Payer: Self-pay | Admitting: Family Medicine

## 2022-01-10 ENCOUNTER — Encounter: Payer: Medicare Other | Admitting: Family Medicine

## 2022-01-11 ENCOUNTER — Other Ambulatory Visit: Payer: Self-pay | Admitting: Physician Assistant

## 2022-01-11 NOTE — Telephone Encounter (Signed)
Next Visit: 05/06/2022  Last Visit: 10/19/2021  Last Fill: 10/19/2021  Dx: Fibromyalgia  Current Dose per office note on 10/19/2021: Flexeril 5 mg a mouth at bedtime   Okay to refill Flexeril?

## 2022-01-18 ENCOUNTER — Ambulatory Visit: Payer: Medicare Other

## 2022-01-18 ENCOUNTER — Telehealth: Payer: Self-pay

## 2022-01-18 NOTE — Telephone Encounter (Signed)
Unsuccessful attempt to reach patient on preferred number listed in notes for scheduled AWV. Left message on voicemail okay to reschedule.

## 2022-01-21 ENCOUNTER — Ambulatory Visit (INDEPENDENT_AMBULATORY_CARE_PROVIDER_SITE_OTHER): Payer: Medicare Other | Admitting: Family Medicine

## 2022-01-21 ENCOUNTER — Encounter: Payer: Self-pay | Admitting: Family Medicine

## 2022-01-21 VITALS — BP 140/80 | HR 85 | Temp 98.3°F | Ht 62.6 in | Wt 150.3 lb

## 2022-01-21 DIAGNOSIS — E7800 Pure hypercholesterolemia, unspecified: Secondary | ICD-10-CM

## 2022-01-21 DIAGNOSIS — R739 Hyperglycemia, unspecified: Secondary | ICD-10-CM

## 2022-01-21 DIAGNOSIS — E038 Other specified hypothyroidism: Secondary | ICD-10-CM | POA: Diagnosis not present

## 2022-01-21 DIAGNOSIS — I10 Essential (primary) hypertension: Secondary | ICD-10-CM

## 2022-01-21 DIAGNOSIS — Z833 Family history of diabetes mellitus: Secondary | ICD-10-CM

## 2022-01-21 DIAGNOSIS — K900 Celiac disease: Secondary | ICD-10-CM | POA: Diagnosis not present

## 2022-01-21 LAB — LIPID PANEL
Cholesterol: 304 mg/dL — ABNORMAL HIGH (ref 0–200)
HDL: 47.8 mg/dL (ref >39.00)
NonHDL: 255.96
Total CHOL/HDL Ratio: 6
Triglycerides: 340 mg/dL — ABNORMAL HIGH (ref 0.0–149.0)
VLDL: 68 mg/dL — ABNORMAL HIGH (ref 0.0–40.0)

## 2022-01-21 LAB — CBC WITH DIFFERENTIAL/PLATELET
Basophils Absolute: 0.1 10*3/uL (ref 0.0–0.1)
Basophils Relative: 0.8 % (ref 0.0–3.0)
Eosinophils Absolute: 0.1 10*3/uL (ref 0.0–0.7)
Eosinophils Relative: 1.7 % (ref 0.0–5.0)
HCT: 42.3 % (ref 36.0–46.0)
Hemoglobin: 14.4 g/dL (ref 12.0–15.0)
Lymphocytes Relative: 36 % (ref 12.0–46.0)
Lymphs Abs: 2.8 10*3/uL (ref 0.7–4.0)
MCHC: 34.2 g/dL (ref 30.0–36.0)
MCV: 93.2 fl (ref 78.0–100.0)
Monocytes Absolute: 0.4 10*3/uL (ref 0.1–1.0)
Monocytes Relative: 4.9 % (ref 3.0–12.0)
Neutro Abs: 4.3 10*3/uL (ref 1.4–7.7)
Neutrophils Relative %: 56.6 % (ref 43.0–77.0)
Platelets: 165 10*3/uL (ref 150.0–400.0)
RBC: 4.54 Mil/uL (ref 3.87–5.11)
RDW: 13.6 % (ref 11.5–15.5)
WBC: 7.7 10*3/uL (ref 4.0–10.5)

## 2022-01-21 LAB — BASIC METABOLIC PANEL
BUN: 18 mg/dL (ref 6–23)
CO2: 31 mEq/L (ref 19–32)
Calcium: 10.6 mg/dL — ABNORMAL HIGH (ref 8.4–10.5)
Chloride: 99 mEq/L (ref 96–112)
Creatinine, Ser: 0.81 mg/dL (ref 0.40–1.20)
GFR: 75.82 mL/min (ref >60.00)
Glucose, Bld: 104 mg/dL — ABNORMAL HIGH (ref 70–99)
Potassium: 4.3 mEq/L (ref 3.5–5.1)
Sodium: 139 mEq/L (ref 135–145)

## 2022-01-21 LAB — HEPATIC FUNCTION PANEL
ALT: 21 U/L (ref 0–35)
AST: 23 U/L (ref 0–37)
Albumin: 4.5 g/dL (ref 3.5–5.2)
Alkaline Phosphatase: 72 U/L (ref 39–117)
Bilirubin, Direct: 0.1 mg/dL (ref 0.0–0.3)
Total Bilirubin: 0.3 mg/dL (ref 0.2–1.2)
Total Protein: 8.2 g/dL (ref 6.0–8.3)

## 2022-01-21 LAB — HEMOGLOBIN A1C: Hgb A1c MFr Bld: 6.1 % (ref 4.6–6.5)

## 2022-01-21 LAB — TSH: TSH: 4.43 u[IU]/mL (ref 0.35–5.50)

## 2022-01-21 LAB — LDL CHOLESTEROL, DIRECT: Direct LDL: 193 mg/dL

## 2022-01-21 MED ORDER — LEVOTHYROXINE SODIUM 150 MCG PO TABS: ORAL_TABLET | ORAL | 3 refills | Status: DC

## 2022-01-21 MED ORDER — CHLORTHALIDONE 25 MG PO TABS: ORAL_TABLET | ORAL | 3 refills | Status: DC

## 2022-01-21 MED ORDER — LOSARTAN POTASSIUM 50 MG PO TABS: ORAL_TABLET | ORAL | 3 refills | Status: DC

## 2022-01-21 MED ORDER — POTASSIUM CHLORIDE CRYS ER 10 MEQ PO TBCR: EXTENDED_RELEASE_TABLET | ORAL | 3 refills | Status: DC

## 2022-01-21 NOTE — Progress Notes (Signed)
Established Patient Office Visit  Subjective   Patient ID: Kimberly Stein, female    DOB: 06/12/1955  Age: 66 y.o. MRN: [id number]  No chief complaint on file.   HPI   Kimberly Stein is seen for medical follow-up.  She has history of hypothyroidism, hypertension, hyperlipidemia.  Unfortunately still smoking.  Low motivation to quit.  She had intolerance to multiple statins in the past.  Very strong family history of type 2 diabetes in mother and 2 sisters.  She had mildly elevated blood sugars in the past.  Does have frequent urination especially at night but no polydipsia.  No recent weight changes.  Overdue for repeat colonoscopy.  She plans to set this up.  She is getting mammograms regularly.  She declines several vaccines including Prevnar 20, influenza vaccine, and Shingrix.  She has hypothyroidism which has been treated for several years with levothyroxine.  Compliant with therapy.  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   Past Surgical History:  Procedure Laterality Date   CARDIAC CATHETERIZATION N/A 02/25/2015   Procedure: Left Heart Cath and Coronary Angiography;  Surgeon: Peter M Martinique, MD;  Location: [facility];  Service: Cardiovascular;  Laterality: N/A;   CERVICAL DISC SURGERY     CHOLECYSTECTOMY     COLONOSCOPY     DILATION AND CURETTAGE OF UTERUS     HAND SURGERY Right    right hand; calcium deposit on wrist and under thumb and ganglion cyst and carpal tunnel   lumbar radiofrequency nerve ablation     twice yearly   MOUTH SURGERY  2016   couple times due to gum disease   ROTATOR CUFF REPAIR Left 06/07/2015   left  shoulder   TOTAL ABDOMINAL HYSTERECTOMY     UPPER GASTROINTESTINAL ENDOSCOPY      reports that she has been smoking cigarettes. She has a 46.00 pack-year smoking history. She has never been exposed to tobacco smoke. She has never used smokeless tobacco. She reports that she does not drink alcohol and does not use drugs. family history includes Breast cancer in her maternal aunt; Celiac disease in her mother and sister; Diabetes in her mother, sister, and sister; Heart disease in her mother; Meniere's disease in her sister; Prostate cancer in her father. Allergies  Allergen Reactions   Crestor [Rosuvastatin] Other (See Comments)    Bilateral Leg pain   Lipitor [Atorvastatin Calcium]     Muscle aches   Nsaids    Nortriptyline Rash   Nortriptyline Hcl Rash    Red rash   Vicodin [Hydrocodone-Acetaminophen] Itching    Review of Systems  Constitutional:  Negative for chills, fever, malaise/fatigue and weight loss.  Eyes:  Negative for blurred vision.  Respiratory:  Negative for shortness of breath.   Cardiovascular:  Negative for chest pain.  Gastrointestinal:  Negative for nausea and vomiting.  Genitourinary:  Negative for dysuria and hematuria.  Neurological:  Negative for dizziness, weakness and headaches.      Objective:     BP (!) 140/80 (BP Location: Left  Arm, Patient Position: Sitting, Cuff Size: Normal)   Pulse 85   Temp 98.3 F (36.8 C) (Oral)   Ht 5' 2.6" (1.59 m)   Wt 150 lb 4.8 oz (68.2 kg)   SpO2 97%   BMI 26.97 kg/m    Physical Exam Constitutional:      Appearance: She is well-developed.  Eyes:     Pupils: Pupils are equal, round, and reactive to light.  Neck:     Thyroid: No thyromegaly.     Vascular: No JVD.  Cardiovascular:     Rate and Rhythm: Normal rate and regular rhythm.     Heart sounds:     No gallop.  Pulmonary:     Effort: Pulmonary effort is normal. No respiratory distress.     Breath sounds: Normal breath sounds. No wheezing or rales.   Abdominal:     Palpations: Abdomen is soft.     Tenderness: There is no abdominal tenderness.  Musculoskeletal:     Cervical back: Neck supple.     Right lower leg: No edema.     Left lower leg: No edema.  Neurological:     Mental Status: She is alert.      No results found for any visits on 01/21/22.    The 10-year ASCVD risk score (Arnett DK, et al., 2019) is: 17.9%    Assessment & Plan:   Problem List Items Addressed This Visit       Unprioritized   Hyperlipidemia   Relevant Medications   chlorthalidone (HYGROTON) 25 MG tablet   losartan (COZAAR) 50 MG tablet   Other Relevant Orders   Lipid panel   Hepatic function panel   Essential hypertension - Primary   Relevant Medications   chlorthalidone (HYGROTON) 25 MG tablet   losartan (COZAAR) 50 MG tablet   Other Relevant Orders   Basic metabolic panel   Hypothyroidism   Relevant Medications   levothyroxine (SYNTHROID) 150 MCG tablet   Other Relevant Orders   TSH   CELIAC SPRUE   Relevant Orders   CBC with Differential/Platelet   Other Visit Diagnoses     Family history of type 2 diabetes mellitus       Relevant Orders   Hemoglobin A1c   Hyperglycemia       Relevant Orders   Hemoglobin A1c     -Check labs as above -Patient encouraged to set up repeat colonoscopy.  She prefers to set this up herself -This gust alternatives for statins with her calculated 10-year risk for CAD over 17%.  We discussed PCSK9 medications but she declines at this time -We recommended Prevnar 20, flu vaccine, Shingrix but she declines -Discussed low-dose CT lung cancer screening.  She will consider.  Handout given.  No follow-ups on file.    [name], MD

## 2022-01-21 NOTE — Patient Instructions (Signed)
Consider low dose lung cancer screen and let me know if interested  Set up repeat colonoscopy.

## 2022-01-21 NOTE — Progress Notes (Signed)
noted 

## 2022-01-31 ENCOUNTER — Other Ambulatory Visit: Payer: Self-pay | Admitting: Family Medicine

## 2022-02-01 ENCOUNTER — Other Ambulatory Visit: Payer: Self-pay | Admitting: Family Medicine

## 2022-02-03 ENCOUNTER — Other Ambulatory Visit: Payer: Self-pay | Admitting: Family Medicine

## 2022-02-03 ENCOUNTER — Ambulatory Visit (INDEPENDENT_AMBULATORY_CARE_PROVIDER_SITE_OTHER): Payer: Medicare Other

## 2022-02-03 VITALS — Ht 62.6 in | Wt 150.0 lb

## 2022-02-03 DIAGNOSIS — Z0000 Encounter for general adult medical examination without abnormal findings: Secondary | ICD-10-CM

## 2022-02-03 NOTE — Progress Notes (Signed)
Subjective:   Kimberly Stein is a 66 y.o. female who presents for Medicare Annual (Subsequent) preventive examination.  Review of Systems    Virtual Visit via Telephone Note  I connected with  Kimberly Stein on 02/03/22 at 12:30 PM EDT by telephone and verified that I am speaking with the correct person using two identifiers.  Location: Patient: Office Provider: Office Persons participating in the virtual visit: [place]   I discussed the limitations, risks, security and privacy concerns of performing an evaluation and management service by telephone and the availability of in person appointments. The patient expressed understanding and agreed to proceed.  Interactive audio and video telecommunications were attempted between this nurse and patient, however failed, due to patient having technical difficulties OR patient did not have access to video capability.  We continued and completed visit with audio only.  Some vital signs may be absent or patient reported.   Criselda Peaches, LPN  Cardiac Risk Factors include: advanced age (>83mn, >>3women);hypertension     Objective:    Today's Vitals   02/03/22 1235  Weight: 150 lb (68 kg)  Height: 5' 2.6" (1.59 m)   Body mass index is 26.91 kg/m.     02/03/2022   12:43 PM 11/17/2020    8:06 AM 09/13/2017    8:03 AM 09/09/2016   10:34 AM 07/27/2015   10:57 AM 02/25/2015    8:05 AM  Advanced Directives  Does Patient Have a Medical Advance Directive? Yes Yes No Yes Yes Yes  Type of AParamedicof AT[place]villeLiving will HIvaLiving will  HCooterLiving will HCanadianLiving will HLake KatrineLiving will  Does patient want to make changes to medical advance directive?  No - Patient declined   No - Patient declined   Copy of [place]in Chart? No - copy requested No - copy requested   No - copy  requested No - copy requested  Would patient like information on creating a medical advance directive?   No - Patient declined  No - patient declined information     Current Medications (verified) Outpatient Encounter Medications as of 02/03/2022  Medication Sig   albuterol (VENTOLIN HFA) 108 (90 Base) MCG/ACT inhaler INHALE 2 PUFFS INTO THE LUNGS TWICE DAILY   Calcium Carb-Cholecalciferol (CALCIUM 600 + D PO) Take by mouth 2 (two) times daily.   chlorthalidone (HYGROTON) 25 MG tablet TAKE 1 TABLET(25 MG) BY MOUTH DAILY   cyclobenzaprine (FLEXERIL) 5 MG tablet TAKE 1 TABLET(5 MG) BY MOUTH AT BEDTIME AS NEEDED FOR MUSCLE SPASMS   FENOFIBRATE PO Take by mouth daily.   FIBER PO Take by mouth in the morning and at bedtime.   levothyroxine (SYNTHROID) 150 MCG tablet TAKE 1 TABLET BY MOUTH EVERY DAY BEFORE BREAKFAST   losartan (COZAAR) 50 MG tablet Take one tablet by mouth once daily   MAGNESIUM PO Take by mouth daily.   Multiple Vitamin (MULTIVITAMIN PO) Take by mouth daily.   omeprazole (PRILOSEC) 40 MG capsule TAKE 1 CAPSULE BY MOUTH EVERY DAY 30 TO 60 MINUTES BEFORE DINNER   potassium chloride (KLOR-CON M) 10 MEQ tablet Take one tablet by mouth once daily   PSYLLIUM HUSK PO Take by mouth. Take 3 pills daily   No facility-administered encounter medications on file as of 02/03/2022.    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   Past Surgical History:  Procedure Laterality Date    CARDIAC CATHETERIZATION N/A 02/25/2015   Procedure: Left Heart Cath and Coronary Angiography;  Surgeon: Peter M Martinique, MD;  Location: [facility];  Service: Cardiovascular;  Laterality: N/A;   CERVICAL DISC SURGERY     CHOLECYSTECTOMY     COLONOSCOPY     DILATION AND CURETTAGE OF UTERUS     HAND SURGERY Right    right hand; calcium deposit on wrist and under thumb and ganglion cyst and carpal tunnel   lumbar radiofrequency nerve ablation     twice yearly   MOUTH SURGERY  2016   couple times due to gum disease   ROTATOR CUFF REPAIR Left 06/07/2015   left shoulder   TOTAL ABDOMINAL HYSTERECTOMY     UPPER GASTROINTESTINAL ENDOSCOPY     Family History  Problem Relation Age of Onset   Celiac disease Mother    Diabetes Mother    Heart disease Mother    Prostate cancer Father    Breast cancer Maternal Aunt    Diabetes Sister    Meniere's disease Sister    Celiac disease Sister    Diabetes Sister    Colon cancer Neg Hx    Esophageal cancer Neg Hx    Stomach cancer Neg Hx    Rectal cancer Neg Hx    Liver cancer Neg Hx    Pancreatic cancer Neg Hx    Social History   Socioeconomic History   Marital status: Married    Spouse name: Not on file   Number of children: 0   Years of education: Not on file   Highest education level: Not on file  Occupational History   Occupation: Housewife  Tobacco Use   Smoking status: Every Day    Packs/day: 1.00    Years: 46.00    Total pack years: 46.00    Types: Cigarettes    Passive exposure: Never   Smokeless tobacco: Never  Vaping Use   Vaping Use: Never used  Substance and Sexual Activity   Alcohol use: No    Alcohol/week: 0.0 standard drinks of alcohol   Drug use: No   Sexual activity: Not on file  Other Topics Concern   Not on file  Social History Narrative   Not on file   Social Determinants of Health   Financial Resource Strain: Low Risk  (02/03/2022)   Overall Financial Resource Strain (CARDIA)    Difficulty of  Paying Living Expenses: Not hard at all  Food Insecurity: No Food Insecurity (02/03/2022)   Hunger Vital Sign    Worried About Running Out of Food in the Last Year: Never true    Ran Out of Food in the Last Year: Never true  Transportation Needs: No Transportation Needs (02/03/2022)   PRAPARE - Hydrologist (Medical): No    Lack of Transportation (Non-Medical): No  Physical Activity: Inactive (02/03/2022)   Exercise Vital Sign    Days  of Exercise per Week: 0 days    Minutes of Exercise per Session: 0 min  Stress: No Stress Concern Present (02/03/2022)   [place]    Feeling of Stress : Not at all  Social Connections: Moderately Integrated (02/03/2022)   Social Connection and Isolation Panel [NHANES]    Frequency of Communication with Friends and Family: More than three times a week    Frequency of Social Gatherings with Friends and Family: More than three times a week    Attends Religious Services: Never    Marine scientist or Organizations: No    Attends Music therapist: More than 4 times per year    Marital Status: Married    Tobacco Counseling Ready to quit: No Counseling given: Yes   Clinical Intake:  Pre-visit preparation completed: No  Pain : No/denies pain     BMI - recorded: 26.91 Nutritional Status: BMI 25 -29 Overweight Nutritional Risks: None Diabetes: No  How often do you need to have someone help you when you read instructions, pamphlets, or other written materials from your doctor or pharmacy?: 1 - Never  Diabetic?  No  Interpreter Needed?: No  Information entered by :: Otis Dials LPN   Activities of Daily Living    02/03/2022   12:42 PM  In your present state of health, do you have any difficulty performing the following activities:  Hearing? 0  Vision? 0  Difficulty concentrating or making decisions? 0  Walking or climbing  stairs? 0  Dressing or bathing? 0  Doing errands, shopping? 0  Preparing Food and eating ? N  Using the Toilet? N  In the past six months, have you accidently leaked urine? N  Do you have problems with loss of bowel control? N  Managing your Medications? N  Managing your Finances? N  Housekeeping or managing your Housekeeping? N    Patient Care Team: Eulas Post, MD as PCP - General Skeet Latch, MD as PCP - Cardiology (Cardiology)  Indicate any recent Medical Services you may have received from other than [facility] providers in the past year (date may be approximate).     Assessment:   This is a routine wellness examination for [place].  Hearing/Vision screen Hearing Screening - Comments:: Denies hearing difficulties   Vision Screening - Comments:: Wears rx glasses - up to date with routine eye exams with  [facility]  Dietary issues and exercise activities discussed: Current Exercise Habits: The patient does not participate in regular exercise at present, Exercise limited by: None identified   Goals Addressed               This Visit's Progress     Patient stated (pt-stated)        Cut back on my smoking       Depression Screen    02/03/2022   12:41 PM 01/21/2022    7:03 AM 08/21/2019    7:07 AM 08/08/2018    9:04 AM 06/30/2017   11:51 AM  PHQ 2/9 Scores  PHQ - 2 Score 0 0 0 0 0  PHQ- 9 Score    0     Fall Risk    02/03/2022   12:43 PM  [place] in the past year? 0  Number falls in past yr: 0  Injury with Fall? 0  Risk for fall due to : No Fall Risks  Follow up Falls prevention discussed  FALL RISK PREVENTION PERTAINING TO THE HOME:  Any stairs in or around the home? Yes  If so, are there any without handrails? No  Home free of loose throw rugs in walkways, pet beds, electrical cords, etc? Yes  Adequate lighting in your home to reduce risk of falls? Yes   ASSISTIVE DEVICES UTILIZED TO PREVENT FALLS:  Life alert? No  Use  of a cane, walker or w/c? No  Grab bars in the bathroom? Yes  Shower chair or bench in shower? Yes  Elevated toilet seat or a handicapped toilet? No   TIMED UP AND GO:  Was the test performed? No . Audio Visit   Cognitive Function:        02/03/2022   12:44 PM  6CIT Screen  What Year? 0 points  What month? 0 points  What time? 0 points  Count back from 20 0 points  Months in reverse 0 points  Repeat phrase 0 points  Total Score 0 points    Immunizations Immunization History  Administered Date(s) Administered   PFIZER(Purple Top)SARS-COV-2 Vaccination 01/06/2020, 01/31/2020   Pneumococcal Conjugate-13 07/17/2013   Tdap 06/26/2013   Zoster, Live 07/17/2013    TDAP status: Up to date    Pneumococcal vaccine status: Due, Education has been provided regarding the importance of this vaccine. Advised may receive this vaccine at local pharmacy or Health Dept. Aware to provide a copy of the vaccination record if obtained from local pharmacy or Health Dept. Verbalized acceptance and understanding.  Covid-19 vaccine status: Completed vaccines  Qualifies for Shingles Vaccine? Yes   Zostavax completed No   Shingrix Completed?: No.    Education has been provided regarding the importance of this vaccine. Patient has been advised to call insurance company to determine out of pocket expense if they have not yet received this vaccine. Advised may also receive vaccine at local pharmacy or Health Dept. Verbalized acceptance and understanding.  Screening Tests Health Maintenance  Topic Date Due   COVID-19 Vaccine (3 - Pfizer risk series) 02/19/2022 (Originally 02/28/2020)   Zoster Vaccines- Shingrix (1 of 2) 04/22/2022 (Originally 12/18/1974)   Pneumonia Vaccine 26+ Years old (2 - PPSV23 or PCV20) 08/20/2022 (Originally 09/11/2013)   COLONOSCOPY (Pts 45-83yr Insurance coverage will need to be confirmed)  02/04/2023 (Originally 09/13/2020)   TETANUS/TDAP  06/27/2023   MAMMOGRAM   12/24/2023   DEXA SCAN  Completed   Hepatitis C Screening  Completed   HPV VACCINES  Aged Out   INFLUENZA VACCINE  Discontinued    Health Maintenance  There are no preventive care reminders to display for this patient.   Colorectal cancer screening: Referral to GI placed Patient deferred. Pt aware the office will call re: appt.  Mammogram status: Completed 12/23/21. Repeat every year  Bone Density status: Completed 07/19/18. Results reflect: Bone density results: NORMAL. Repeat every   years.  Lung Cancer Screening: (Low Dose CT Chest recommended if Age 66-80years, 30 pack-year currently smoking OR have quit w/in 15years.) does qualify.   Lung Cancer Screening Referral: Patient deferred  Additional Screening:  Hepatitis C Screening: does qualify; Completed 06/30/17  Vision Screening: Recommended annual ophthalmology exams for early detection of glaucoma and other disorders of the eye. Is the patient up to date with their annual eye exam?  Yes  Who is the provider or what is the name of the [facility] which the patient attends annual [facility] exams[facility] not [facility] a provider, would they like  to be referred to a provider to establish care? No .   Dental Screening: Recommended annual dental exams for proper oral hygiene  Community Resource Referral / Chronic Care Management:  CRR required this visit?  No   CCM required this visit?  No      Plan:     I have personally reviewed and noted the following in the patient's chart:   Medical and social history Use of alcohol, tobacco or illicit drugs  Current medications and supplements including opioid prescriptions. Patient is not currently taking opioid prescriptions. Functional ability and status Nutritional status Physical activity Advanced directives List of other physicians Hospitalizations, surgeries, and ER visits in previous 12 months Vitals Screenings to include cognitive, depression,  and falls Referrals and appointments  In addition, I have reviewed and discussed with patient certain preventive protocols, quality metrics, and best practice recommendations. A written personalized care plan for preventive services as well as general preventive health recommendations were provided to patient.     Criselda Peaches, LPN   [id number]   Nurse Notes: None

## 2022-02-03 NOTE — Patient Instructions (Addendum)
[name] , Thank you for taking time to come for your Medicare Wellness Visit. I appreciate your ongoing commitment to your health goals. Please review the following plan we discussed and let me know if I can assist you in the future.   Screening recommendations/referrals: Colonoscopy: Deferred Mammogram: Done 12/23/21 Repeat 1 Yr Bone Density: Done Recommended yearly ophthalmology/optometry visit for glaucoma screening and checkup Recommended yearly dental visit for hygiene and checkup  Vaccinations: Influenza vaccine: Declined Pneumococcal vaccine: Deferred Tdap vaccine: Up to date Shingles vaccine: Deferred   Covid-19:Done  Advanced directives: Please bring a copy of your health care power of attorney and living will to the office to be added to your chart at your convenience.   Conditions/risks identified: None  Next appointment: Follow up in one year for your annual wellness visit     Preventive Care 66 Years and Older, Female Preventive care refers to lifestyle choices and visits with your health care provider that can promote health and wellness. What does preventive care include? A yearly physical exam. This is also called an annual well check. Dental exams once or twice a year. Routine eye exams. Ask your health care provider how often you should have your eyes checked. Personal lifestyle choices, including: Daily care of your teeth and gums. Regular physical activity. Eating a healthy diet. Avoiding tobacco and drug use. Limiting alcohol use. Practicing safe sex. Taking low-dose aspirin every day. Taking vitamin and mineral supplements as recommended by your health care provider. What happens during an annual well check? The services and screenings done by your health care provider during your annual well check will depend on your age, overall health, lifestyle risk factors, and family history of disease. Counseling  Your health care provider may ask you questions  about your: Alcohol use. Tobacco use. Drug use. Emotional well-being. Home and relationship well-being. Sexual activity. Eating habits. History of falls. Memory and ability to understand (cognition). Work and work Statistician. Reproductive health. Screening  You may have the following tests or measurements: Height, weight, and BMI. Blood pressure. Lipid and cholesterol levels. These may be checked every 5 years, or more frequently if you are over 46 years old. Skin check. Lung cancer screening. You may have this screening every year starting at age 66 if you have a 30-pack-year history of smoking and currently smoke or have quit within the past 15 years. Fecal occult blood test (FOBT) of the stool. You may have this test every year starting at age 66. Flexible sigmoidoscopy or colonoscopy. You may have a sigmoidoscopy every 5 years or a colonoscopy every 10 years starting at age 66. Hepatitis C blood test. Hepatitis B blood test. Sexually transmitted disease (STD) testing. Diabetes screening. This is done by checking your blood sugar (glucose) after you have not eaten for a while (fasting). You may have this done every 1-3 years. Bone density scan. This is done to screen for osteoporosis. You may have this done starting at age 66. Mammogram. This may be done every 1-2 years. Talk to your health care provider about how often you should have regular mammograms. Talk with your health care provider about your test results, treatment options, and if necessary, the need for more tests. Vaccines  Your health care provider may recommend certain vaccines, such as: Influenza vaccine. This is recommended every year. Tetanus, diphtheria, and acellular pertussis (Tdap, Td) vaccine. You may need a Td booster every 10 years. Zoster vaccine. You may need this after age 64. Pneumococcal 13-valent  conjugate (PCV13) vaccine. One dose is recommended after age 66. Pneumococcal polysaccharide (PPSV23)  vaccine. One dose is recommended after age 66. Talk to your health care provider about which screenings and vaccines you need and how often you need them. This information is not intended to replace advice given to you by your health care provider. Make sure you discuss any questions you have with your health care provider. Document Released: 06/05/2015 Document Revised: 01/27/2016 Document Reviewed: 03/10/2015 Elsevier Interactive Patient Education  2017 [employer] Prevention in the Home Falls can cause injuries. They can happen to people of all ages. There are many things you can do to make your home safe and to help prevent falls. What can I do on the outside of my home? Regularly fix the edges of walkways and driveways and fix any cracks. Remove anything that might make you trip as you walk through a door, such as a raised step or threshold. Trim any bushes or trees on the path to your home. Use bright outdoor lighting. Clear any walking paths of anything that might make someone trip, such as rocks or tools. Regularly check to see if handrails are loose or broken. Make sure that both sides of any steps have handrails. Any raised decks and porches should have guardrails on the edges. Have any leaves, snow, or ice cleared regularly. Use sand or salt on walking paths during winter. Clean up any spills in your garage right away. This includes oil or grease spills. What can I do in the bathroom? Use night lights. Install grab bars by the toilet and in the tub and shower. Do not use towel bars as grab bars. Use non-skid mats or decals in the tub or shower. If you need to sit down in the shower, use a plastic, non-slip stool. Keep the floor dry. Clean up any water that spills on the floor as soon as it happens. Remove soap buildup in the tub or shower regularly. Attach bath mats securely with double-sided non-slip rug tape. Do not have throw rugs and other things on the floor that  can make you trip. What can I do in the bedroom? Use night lights. Make sure that you have a light by your bed that is easy to reach. Do not use any sheets or blankets that are too big for your bed. They should not hang down onto the floor. Have a firm chair that has side arms. You can use this for support while you get dressed. Do not have throw rugs and other things on the floor that can make you trip. What can I do in the kitchen? Clean up any spills right away. Avoid walking on wet floors. Keep items that you use a lot in easy-to-reach places. If you need to reach something above you, use a strong step stool that has a grab bar. Keep electrical cords out of the way. Do not use floor polish or wax that makes floors slippery. If you must use wax, use non-skid floor wax. Do not have throw rugs and other things on the floor that can make you trip. What can I do with my stairs? Do not leave any items on the stairs. Make sure that there are handrails on both sides of the stairs and use them. Fix handrails that are broken or loose. Make sure that handrails are as long as the stairways. Check any carpeting to make sure that it is firmly attached to the stairs. Fix any carpet that  is loose or worn. Avoid having throw rugs at the top or bottom of the stairs. If you do have throw rugs, attach them to the floor with carpet tape. Make sure that you have a light switch at the top of the stairs and the bottom of the stairs. If you do not have them, ask someone to add them for you. What else can I do to help prevent falls? Wear shoes that: Do not have high heels. Have rubber bottoms. Are comfortable and fit you well. Are closed at the toe. Do not wear sandals. If you use a stepladder: Make sure that it is fully opened. Do not climb a closed stepladder. Make sure that both sides of the stepladder are locked into place. Ask someone to hold it for you, if possible. Clearly mark and make sure that you  can see: Any grab bars or handrails. First and last steps. Where the edge of each step is. Use tools that help you move around (mobility aids) if they are needed. These include: Canes. Walkers. Scooters. Crutches. Turn on the lights when you go into a dark area. Replace any light bulbs as soon as they burn out. Set up your furniture so you have a clear path. Avoid moving your furniture around. If any of your floors are uneven, fix them. If there are any pets around you, be aware of where they are. Review your medicines with your doctor. Some medicines can make you feel dizzy. This can increase your chance of falling. Ask your doctor what other things that you can do to help prevent falls. This information is not intended to replace advice given to you by your health care provider. Make sure you discuss any questions you have with your health care provider. Document Released: 03/05/2009 Document Revised: 10/15/2015 Document Reviewed: 06/13/2014 Elsevier Interactive Patient Education  2017 [employer].

## 2022-03-23 ENCOUNTER — Other Ambulatory Visit: Payer: Self-pay | Admitting: Family Medicine

## 2022-04-06 ENCOUNTER — Other Ambulatory Visit: Payer: Self-pay | Admitting: *Deleted

## 2022-04-06 MED ORDER — CYCLOBENZAPRINE HCL 5 MG PO TABS
5.0000 mg | ORAL_TABLET | Freq: Every evening | ORAL | 2 refills | Status: DC | PRN
Start: 2022-04-06 — End: 2022-09-12

## 2022-04-06 NOTE — Telephone Encounter (Signed)
Next Visit: 05/06/2022   Last Visit: 10/19/2021   Last Fill: 01/11/2022   Dx: Fibromyalgia   Current Dose per office note on 10/19/2021: Flexeril 5 mg a mouth at bedtime    Okay to refill Flexeril?

## 2022-04-22 NOTE — Progress Notes (Deleted)
Office Visit Note  Patient: Kimberly Stein             Date of Birth: 1956/01/06           MRN: [id number]             PCP: Eulas Post, MD Referring: Eulas Post, MD Visit Date: 05/06/2022 Occupation: @GUAROCC @  Subjective:  No chief complaint on file.   History of Present Illness: Kimberly Stein is a 66 y.o. female ***   Activities of Daily Living:  Patient reports morning stiffness for *** {minute/hour:19697}.   Patient {ACTIONS;DENIES/REPORTS:21021675::"Denies"} nocturnal pain.  Difficulty dressing/grooming: {ACTIONS;DENIES/REPORTS:21021675::"Denies"} Difficulty climbing stairs: {ACTIONS;DENIES/REPORTS:21021675::"Denies"} Difficulty getting out of chair: {ACTIONS;DENIES/REPORTS:21021675::"Denies"} Difficulty using hands for taps, buttons, cutlery, and/or writing: {ACTIONS;DENIES/REPORTS:21021675::"Denies"}  No Rheumatology ROS completed.   PMFS History:  Patient Active Problem List   Diagnosis Date Noted   Pain in right shoulder 10/29/2020   Low back pain 11/26/2019   Impingement syndrome of right shoulder 11/26/2019   Belching 04/17/2019   Screening for viral disease 04/17/2019   Other fatigue 06/13/2016   Primary insomnia 06/13/2016   Primary osteoarthritis of both hands 06/13/2016   Trochanteric bursitis of both hips 06/13/2016   DDD cervical spine status post fusion 06/13/2016   Facet arthritis, degenerative, lumbar spine 06/13/2016   Tendinopathy of right rotator cuff 06/13/2016   Chest pain, atypical 02/25/2015   Abnormal nuclear stress test 02/25/2015   Hyperlipidemia 07/17/2013   Osteopenia 01/28/2012   OPIOID DEPENDENCE 03/09/2010   CELIAC SPRUE 09/29/2009   Hypothyroidism 09/07/2009   Meniere's disease 09/07/2009   Essential hypertension 09/07/2009   RUQ PAIN 09/07/2009   Fibromyalgia 06/12/2009   CERVICALGIA 06/03/2009   ANEMIA-IRON DEFICIENCY 10/21/2008   Gastroesophageal reflux disease 10/21/2008   DIZZINESS 10/21/2008    COLONIC POLYPS, HX OF 10/21/2008    Past Medical History:  Diagnosis Date   Allergy    sinus infections   Anemia    Arthritis    Celiac disease    Fibromyalgia    GERD (gastroesophageal reflux disease)    Heart murmur    Hiatal hernia    Hyperlipidemia    Hypertension    Meniere's disease    Neuromuscular disorder ([place])    fibromyalgia   Thyroid disease    hypothyroid   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    Family History  Problem Relation Age of Onset   Celiac disease Mother    Diabetes Mother    Heart disease Mother    Prostate cancer Father    Breast cancer Maternal Aunt    Diabetes Sister    Meniere's disease Sister    Celiac disease Sister    Diabetes Sister    Colon cancer Neg Hx    Esophageal cancer Neg Hx    Stomach cancer Neg Hx    Rectal cancer Neg Hx    Liver cancer Neg Hx    Pancreatic cancer Neg Hx    Past Surgical History:  Procedure Laterality Date   CARDIAC CATHETERIZATION N/A 02/25/2015   Procedure: Left Heart Cath and Coronary Angiography;  Surgeon: Peter M Martinique, MD;  Location: [facility];  Service: Cardiovascular;  Laterality: N/A;   CERVICAL DISC SURGERY     CHOLECYSTECTOMY     COLONOSCOPY     DILATION AND CURETTAGE OF UTERUS     HAND SURGERY Right    right hand; calcium deposit on wrist and under thumb and ganglion cyst and carpal tunnel  lumbar radiofrequency nerve ablation     twice yearly   MOUTH SURGERY  2016   couple times due to gum disease   ROTATOR CUFF REPAIR Left 06/07/2015   left shoulder   TOTAL ABDOMINAL HYSTERECTOMY     UPPER GASTROINTESTINAL ENDOSCOPY     Social History   Social History Narrative   Not on file   Immunization History  Administered Date(s) Administered   PFIZER(Purple Top)SARS-COV-2 Vaccination 01/06/2020, 01/31/2020   Pneumococcal Conjugate-13 07/17/2013   Tdap 06/26/2013   Zoster, Live 07/17/2013     Objective: Vital Signs: There were no vitals taken for this visit.   Physical  Exam   Musculoskeletal Exam: ***  CDAI Exam: CDAI Score: -- Patient Global: --; Provider Global: -- Swollen: --; Tender: -- Joint Exam 05/06/2022   No joint exam has been documented for this visit   There is currently no information documented on the homunculus. Go to the Rheumatology activity and complete the homunculus joint exam.  Investigation: No additional findings.  Imaging: No results found.  Recent Labs: Lab Results  Component Value Date   WBC 7.7 01/21/2022   HGB 14.4 01/21/2022   PLT 165.0 01/21/2022   NA 139 01/21/2022   K 4.3 01/21/2022   CL 99 01/21/2022   CO2 31 01/21/2022   GLUCOSE 104 (H) 01/21/2022   BUN 18 01/21/2022   CREATININE 0.81 01/21/2022   BILITOT 0.3 01/21/2022   ALKPHOS 72 01/21/2022   AST 23 01/21/2022   ALT 21 01/21/2022   PROT 8.2 01/21/2022   ALBUMIN 4.5 01/21/2022   CALCIUM 10.6 (H) 01/21/2022   GFRAA 85 12/12/2016    Speciality Comments: No specialty comments available.  Procedures:  No procedures performed Allergies: Crestor [rosuvastatin], Lipitor [atorvastatin calcium], Nsaids, Nortriptyline, Nortriptyline hcl, and Vicodin [hydrocodone-acetaminophen]   Assessment / Plan:     Visit Diagnoses: No diagnosis found.  Orders: No orders of the defined types were placed in this encounter.  No orders of the defined types were placed in this encounter.   Face-to-face time spent with patient was *** minutes. Greater than 50% of time was spent in counseling and coordination of care.  Follow-Up Instructions: No follow-ups on file.   Earnestine Mealing, CMA  Note - This record has been created using Editor, commissioning.  Chart creation errors have been sought, but may not always  have been located. Such creation errors do not reflect on  the standard of medical care.

## 2022-05-06 ENCOUNTER — Ambulatory Visit: Payer: Medicare Other | Admitting: Rheumatology

## 2022-05-06 DIAGNOSIS — F5101 Primary insomnia: Secondary | ICD-10-CM

## 2022-05-06 DIAGNOSIS — M19042 Primary osteoarthritis, left hand: Secondary | ICD-10-CM

## 2022-05-06 DIAGNOSIS — M5136 Other intervertebral disc degeneration, lumbar region: Secondary | ICD-10-CM

## 2022-05-06 DIAGNOSIS — Z8679 Personal history of other diseases of the circulatory system: Secondary | ICD-10-CM

## 2022-05-06 DIAGNOSIS — M67911 Unspecified disorder of synovium and tendon, right shoulder: Secondary | ICD-10-CM

## 2022-05-06 DIAGNOSIS — M5030 Other cervical disc degeneration, unspecified cervical region: Secondary | ICD-10-CM

## 2022-05-06 DIAGNOSIS — M7062 Trochanteric bursitis, left hip: Secondary | ICD-10-CM

## 2022-05-06 DIAGNOSIS — Z8639 Personal history of other endocrine, nutritional and metabolic disease: Secondary | ICD-10-CM

## 2022-05-06 DIAGNOSIS — Z8669 Personal history of other diseases of the nervous system and sense organs: Secondary | ICD-10-CM

## 2022-05-06 DIAGNOSIS — Z8719 Personal history of other diseases of the digestive system: Secondary | ICD-10-CM

## 2022-05-06 DIAGNOSIS — R5383 Other fatigue: Secondary | ICD-10-CM

## 2022-05-06 DIAGNOSIS — Z862 Personal history of diseases of the blood and blood-forming organs and certain disorders involving the immune mechanism: Secondary | ICD-10-CM

## 2022-05-06 DIAGNOSIS — M797 Fibromyalgia: Secondary | ICD-10-CM

## 2022-05-06 DIAGNOSIS — M8589 Other specified disorders of bone density and structure, multiple sites: Secondary | ICD-10-CM

## 2022-05-11 ENCOUNTER — Other Ambulatory Visit: Payer: Self-pay | Admitting: Family Medicine

## 2022-05-26 NOTE — Progress Notes (Unsigned)
Office Visit Note  Patient: Kimberly Stein             Date of Birth: 07-17-1955           MRN: [id number]             PCP: Eulas Post, MD Referring: Eulas Post, MD Visit Date: 06/02/2022 Occupation: '@GUAROCC'$ @  Subjective:  Left trochanteric bursitis   History of Present Illness: Kimberly Stein is a 67 y.o. female with history of fibromyalgia, osteoarthritis, and DDD.  Patient reports that she has been under increased stress recently.  She states that she is currently grieving the loss of her older sister who passed away last week.  She states she has had interrupted sleep at night caring for her dog who has heart failure.  She states she is waking up every hour to care for her dog.  She has had some increased fatigue throughout the day secondary to the interrupted sleep at night.  Patient continues to have chronic pain in her lower back.  She has not had a spinal ablation recently.  She has been taking Flexeril 5 mg 1 tablet at bedtime.  She is also having ongoing discomfort in the left hip secondary to trochanter bursitis.  She has been trying to practice yoga and stretching on a regular basis.  She remains active around the house.  She denies any joint swelling. She denies any new medical conditions.     Activities of Daily Living:  Patient reports morning stiffness for 1.5-2 hours.   Patient Reports nocturnal pain.  Difficulty dressing/grooming: Denies Difficulty climbing stairs: Denies Difficulty getting out of chair: Denies Difficulty using hands for taps, buttons, cutlery, and/or writing: Denies  Review of Systems  Constitutional:  Positive for fatigue.  HENT:  Positive for mouth dryness. Negative for mouth sores.   Eyes:  Negative for dryness.  Respiratory:  Negative for shortness of breath.   Cardiovascular:  Negative for chest pain and palpitations.  Gastrointestinal:  Negative for blood in stool, constipation and diarrhea.  Endocrine: Positive for  increased urination.  Genitourinary:  Negative for involuntary urination.  Musculoskeletal:  Positive for joint pain, joint pain, myalgias, morning stiffness, muscle tenderness and myalgias. Negative for gait problem, joint swelling and muscle weakness.  Skin:  Negative for color change, rash, hair loss and sensitivity to sunlight.  Allergic/Immunologic: Negative for susceptible to infections.  Neurological:  Negative for dizziness and headaches.  Hematological:  Negative for swollen glands.  Psychiatric/Behavioral:  Positive for sleep disturbance. Negative for depressed mood. The patient is not nervous/anxious.     PMFS History:  Patient Active Problem List   Diagnosis Date Noted   Pain in right shoulder 10/29/2020   Low back pain 11/26/2019   Impingement syndrome of right shoulder 11/26/2019   Belching 04/17/2019   Screening for viral disease 04/17/2019   Other fatigue 06/13/2016   Primary insomnia 06/13/2016   Primary osteoarthritis of both hands 06/13/2016   Trochanteric bursitis of both hips 06/13/2016   DDD cervical spine status post fusion 06/13/2016   Facet arthritis, degenerative, lumbar spine 06/13/2016   Tendinopathy of right rotator cuff 06/13/2016   Chest pain, atypical 02/25/2015   Abnormal nuclear stress test 02/25/2015   Hyperlipidemia 07/17/2013   Osteopenia 01/28/2012   OPIOID DEPENDENCE 03/09/2010   CELIAC SPRUE 09/29/2009   Hypothyroidism 09/07/2009   Meniere's disease 09/07/2009   Essential hypertension 09/07/2009   RUQ PAIN 09/07/2009   Fibromyalgia 06/12/2009   CERVICALGIA  06/03/2009   ANEMIA-IRON DEFICIENCY 10/21/2008   Gastroesophageal reflux disease 10/21/2008   DIZZINESS 10/21/2008   COLONIC POLYPS, HX OF 10/21/2008    Past Medical History:  Diagnosis Date   Allergy    sinus infections   Anemia    Arthritis    Celiac disease    Fibromyalgia    GERD (gastroesophageal reflux disease)    Heart murmur    Hiatal hernia    Hyperlipidemia     Hypertension    Meniere's disease    Neuromuscular disorder ([place])    fibromyalgia   Thyroid disease    hypothyroid   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    Family History  Problem Relation Age of Onset   Celiac disease Mother    Diabetes Mother    Heart disease Mother    Prostate cancer Father    Breast cancer Maternal Aunt    Diabetes Sister    Meniere's disease Sister    Celiac disease Sister    Diabetes Sister    Colon cancer Neg Hx    Esophageal cancer Neg Hx    Stomach cancer Neg Hx    Rectal cancer Neg Hx    Liver cancer Neg Hx    Pancreatic cancer Neg Hx    Past Surgical History:  Procedure Laterality Date   CARDIAC CATHETERIZATION N/A 02/25/2015   Procedure: Left Heart Cath and Coronary Angiography;  Surgeon: Peter M Martinique, MD;  Location: [facility];  Service: Cardiovascular;  Laterality: N/A;   CERVICAL DISC SURGERY     CHOLECYSTECTOMY     COLONOSCOPY     DILATION AND CURETTAGE OF UTERUS     HAND SURGERY Right    right hand; calcium deposit on wrist and under thumb and ganglion cyst and carpal tunnel   lumbar radiofrequency nerve ablation     twice yearly   MOUTH SURGERY  2016   couple times due to gum disease   ROTATOR CUFF REPAIR Left 06/07/2015   left shoulder   TOTAL ABDOMINAL HYSTERECTOMY     UPPER GASTROINTESTINAL ENDOSCOPY     Social History   Social History Narrative   Not on file   Immunization History  Administered Date(s) Administered   PFIZER(Purple Top)SARS-COV-2 Vaccination 01/06/2020, 01/31/2020   Pneumococcal Conjugate-13 07/17/2013   Tdap 06/26/2013   Zoster, Live 07/17/2013     Objective: Vital Signs: BP 135/78 (BP Location: Left Arm, Patient Position: Sitting, Cuff Size: Normal)   Pulse 66   Resp 17   Ht '5\' 3"'$  (1.6 m)   Wt 156 lb (70.8 kg)   BMI 27.63 kg/m    Physical Exam Vitals and nursing note reviewed.  Constitutional:      Appearance: She is well-developed.  HENT:     Head: Normocephalic and atraumatic.   Eyes:     Conjunctiva/sclera: Conjunctivae normal.  Cardiovascular:     Rate and Rhythm: Normal rate and regular rhythm.     Heart sounds: Normal heart sounds.  Pulmonary:     Effort: Pulmonary effort is normal.     Breath sounds: Normal breath sounds.  Abdominal:     General: Bowel sounds are normal.     Palpations: Abdomen is soft.  Musculoskeletal:     Cervical back: Normal range of motion.  Skin:    General: Skin is warm and dry.     Capillary Refill: Capillary refill takes less than 2 seconds.  Neurological:     Mental Status: She is alert and oriented  to person, place, and time.  Psychiatric:        Behavior: Behavior normal.      Musculoskeletal Exam: C-spine has limited ROM with lateral rotation. Painful ROM of lumbar spine.  Shoulder joints, wrist joints, MCPs, PIPs, and DIPs good ROM with no synovitis.  Complete fist formation bilaterally.  Hip joints, knee joints, and ankle joints good ROM with no discomfort.  No warmth or effusion of knee joints.  No tenderness or swelling of ankle joints.  Tenderness over the left trochanteric bursa.   CDAI Exam: CDAI Score: -- Patient Global: --; Provider Global: -- Swollen: --; Tender: -- Joint Exam 06/02/2022   No joint exam has been documented for this visit   There is currently no information documented on the homunculus. Go to the Rheumatology activity and complete the homunculus joint exam.  Investigation: No additional findings.  Imaging: No results found.  Recent Labs: Lab Results  Component Value Date   WBC 7.7 01/21/2022   HGB 14.4 01/21/2022   PLT 165.0 01/21/2022   NA 139 01/21/2022   K 4.3 01/21/2022   CL 99 01/21/2022   CO2 31 01/21/2022   GLUCOSE 104 (H) 01/21/2022   BUN 18 01/21/2022   CREATININE 0.81 01/21/2022   BILITOT 0.3 01/21/2022   ALKPHOS 72 01/21/2022   AST 23 01/21/2022   ALT 21 01/21/2022   PROT 8.2 01/21/2022   ALBUMIN 4.5 01/21/2022   CALCIUM 10.6 (H) 01/21/2022   GFRAA 85  12/12/2016    Speciality Comments: No specialty comments available.  Procedures:  No procedures performed Allergies: Crestor [rosuvastatin], Lipitor [atorvastatin calcium], Nsaids, Nortriptyline, Nortriptyline hcl, and Vicodin [hydrocodone-acetaminophen]    Assessment / Plan:     Visit Diagnoses: Fibromyalgia: Patient continues to experience intermittent myalgias and muscle tenderness due to fibromyalgia.  Overall her symptoms have been tolerable.  She is currently grieving the loss of her sister so she has been under increased stress recently.  She is having some increased myalgias and arthralgias which she attributes to the increased stress and insomnia.  She is taking Flexeril 5 mg 1 tablet at bedtime as needed for muscle spasms which has been helpful.  She does not need a refill at this time.  She presents today with discomfort in the left trochanteric bursa.  She declined a cortisone injection today but will notify us if her symptoms persist or worsen at which time she can return for an injection in the future.  Discussed the importance of stress management, regular exercise, and good sleep hygiene.  She will follow-up in the office in 6 months or sooner if needed.  Trochanteric bursitis of both hips: She has tenderness ovation over the left trochanteric bursa.  Right hip is currently asymptomatic.  Discussed the importance of performing stretching exercises daily.  She was given a handout of these exercises to perform.  Offered a left trochanteric bursa cortisone injection today but she declined.  She will notify us if her symptoms persist or worsen at which time she can return for an injection.    Primary osteoarthritis of both hands: No joint tenderness or synovitis noted.  Complete fist formation bilaterally.  Tendinopathy of right rotator cuff: Good range of motion with no discomfort currently.  DDD (degenerative disc disease), cervical - s/p fusion: Limited range of motion with lateral  rotation.  No symptoms of radiculopathy at this time.  DDD (degenerative disc disease), lumbar - She was previously followed by Dr. Maryjean Ka and would have spinal nerve ablations  every 6 months.  She continues to have chronic pain in her lower back.  She has not had a spinal nerve ablation recently.  She takes Flexeril 5 mg at bedtime as needed for muscle spasms.  Primary insomnia - She takes Flexeril 5 mg a mouth at bedtime to help with muscle spasms and insomnia.  Discussed the importance of good sleep hygiene.  Other fatigue: Discussed the importance of regular exercise and good sleep hygiene.  Osteopenia of multiple sites - DEXA on 07/19/2018 right femoral neck BMD 0.814 with T score -0.3.  Other medical conditions are listed as follows:   History of hyperlipidemia  History of Meniere's disease  History of anemia  History of gastroesophageal reflux (GERD)  History of hypertension: BP was 135/78 today in the office.    History of hypothyroidism  Orders: No orders of the defined types were placed in this encounter.  No orders of the defined types were placed in this encounter.     Follow-Up Instructions: Return in about 6 months (around 12/01/2022) for Fibromyalgia, Osteoarthritis, DDD.   Ofilia Neas, PA-C  Note - This record has been created using Dragon software.  Chart creation errors have been sought, but may not always  have been located. Such creation errors do not reflect on  the standard of medical care.

## 2022-06-02 ENCOUNTER — Ambulatory Visit: Payer: Medicare Other | Attending: Rheumatology | Admitting: Physician Assistant

## 2022-06-02 ENCOUNTER — Encounter: Payer: Self-pay | Admitting: Physician Assistant

## 2022-06-02 VITALS — BP 135/78 | HR 66 | Resp 17 | Ht 63.0 in | Wt 156.0 lb

## 2022-06-02 DIAGNOSIS — M7061 Trochanteric bursitis, right hip: Secondary | ICD-10-CM | POA: Diagnosis present

## 2022-06-02 DIAGNOSIS — M5136 Other intervertebral disc degeneration, lumbar region: Secondary | ICD-10-CM | POA: Diagnosis present

## 2022-06-02 DIAGNOSIS — Z862 Personal history of diseases of the blood and blood-forming organs and certain disorders involving the immune mechanism: Secondary | ICD-10-CM | POA: Diagnosis present

## 2022-06-02 DIAGNOSIS — Z8639 Personal history of other endocrine, nutritional and metabolic disease: Secondary | ICD-10-CM | POA: Diagnosis present

## 2022-06-02 DIAGNOSIS — M7062 Trochanteric bursitis, left hip: Secondary | ICD-10-CM | POA: Insufficient documentation

## 2022-06-02 DIAGNOSIS — M67911 Unspecified disorder of synovium and tendon, right shoulder: Secondary | ICD-10-CM | POA: Diagnosis present

## 2022-06-02 DIAGNOSIS — R5383 Other fatigue: Secondary | ICD-10-CM | POA: Diagnosis present

## 2022-06-02 DIAGNOSIS — Z8719 Personal history of other diseases of the digestive system: Secondary | ICD-10-CM | POA: Insufficient documentation

## 2022-06-02 DIAGNOSIS — M797 Fibromyalgia: Secondary | ICD-10-CM | POA: Insufficient documentation

## 2022-06-02 DIAGNOSIS — Z8679 Personal history of other diseases of the circulatory system: Secondary | ICD-10-CM | POA: Diagnosis present

## 2022-06-02 DIAGNOSIS — M8589 Other specified disorders of bone density and structure, multiple sites: Secondary | ICD-10-CM | POA: Diagnosis present

## 2022-06-02 DIAGNOSIS — Z8669 Personal history of other diseases of the nervous system and sense organs: Secondary | ICD-10-CM | POA: Diagnosis present

## 2022-06-02 DIAGNOSIS — M19042 Primary osteoarthritis, left hand: Secondary | ICD-10-CM | POA: Insufficient documentation

## 2022-06-02 DIAGNOSIS — M19041 Primary osteoarthritis, right hand: Secondary | ICD-10-CM | POA: Diagnosis present

## 2022-06-02 DIAGNOSIS — M5030 Other cervical disc degeneration, unspecified cervical region: Secondary | ICD-10-CM | POA: Diagnosis present

## 2022-06-02 DIAGNOSIS — F5101 Primary insomnia: Secondary | ICD-10-CM | POA: Diagnosis present

## 2022-06-02 NOTE — Patient Instructions (Signed)
Hip Bursitis Rehab Ask your health care provider which exercises are safe for you. Do exercises exactly as told by your health care provider and adjust them as directed. It is normal to feel mild stretching, pulling, tightness, or discomfort as you do these exercises. Stop right away if you feel sudden pain or your pain gets worse. Do not begin these exercises until told by your health care provider. Stretching exercise This exercise warms up your muscles and joints and improves the movement and flexibility of your hip. This exercise also helps to relieve pain and stiffness. Iliotibial band stretch An iliotibial band is a strong band of muscle tissue that runs from the outer side of your hip to the outer side of your thigh and knee. Lie on your side with your left / right leg in the top position. Bend your left / right knee and grab your ankle. Stretch out your bottom arm to help you balance. Slowly bring your knee back so your thigh is slightly behind your body. Slowly lower your knee toward the floor until you feel a gentle stretch on the outside of your left / right thigh. If you do not feel a stretch and your knee will not lower more toward the floor, place the heel of your other foot on top of your knee and pull your knee down toward the floor with your foot. Hold this position for __________ seconds. Slowly return to the starting position. Repeat __________ times. Complete this exercise __________ times a day. Strengthening exercises These exercises build strength and endurance in your hip and pelvis. Endurance is the ability to use your muscles for a long time, even after they get tired. Bridge This exercise strengthens the muscles that move your thigh backward (hip extensors). Lie on your back on a firm surface with your knees bent and your feet flat on the floor. Tighten your buttocks muscles and lift your buttocks off the floor until your trunk is level with your thighs. Do not arch your  back. You should feel the muscles working in your buttocks and the back of your thighs. If you do not feel these muscles, slide your feet 1-2 inches (2.5-5 cm) farther away from your buttocks. If this exercise is too easy, try doing it with your arms crossed over your chest. Hold this position for __________ seconds. Slowly lower your hips to the starting position. Let your muscles relax completely after each repetition. Repeat __________ times. Complete this exercise __________ times a day. Squats This exercise strengthens the muscles in front of your thigh and knee (quadriceps). Stand in front of a table, with your feet and knees pointing straight ahead. You may rest your hands on the table for balance but not for support. Slowly bend your knees and lower your hips like you are going to sit in a chair. Keep your weight over your heels, not over your toes. Keep your lower legs upright so they are parallel with the table legs. Do not let your hips go lower than your knees. Do not bend lower than told by your health care provider. If your hip pain increases, do not bend as low. Hold the squat position for __________ seconds. Slowly push with your legs to return to standing. Do not use your hands to pull yourself to standing. Repeat __________ times. Complete this exercise __________ times a day. Hip hike  Stand sideways on a bottom step. Stand on your left / right leg with your other foot unsupported next to   the step. You can hold on to the railing or wall for balance if needed. Keep your knees straight and your torso square. Then lift your left / right hip up toward the ceiling. Hold this position for __________ seconds. Slowly let your left / right hip lower toward the floor, past the starting position. Your foot should get closer to the floor. Do not lean or bend your knees. Repeat __________ times. Complete this exercise __________ times a day. Single leg stand This exercise increases  your balance. Without shoes, stand near a railing or in a doorway. You may hold on to the railing or door frame as needed for balance. Squeeze your left / right buttock muscles, then lift up your other foot. Do not let your left / right hip push out to the side. It is helpful to stand in front of a mirror for this exercise so you can watch your hip. Hold this position for __________ seconds. Repeat __________ times. Complete this exercise __________ times a day. This information is not intended to replace advice given to you by your health care provider. Make sure you discuss any questions you have with your health care provider. Document Revised: 04/21/2021 Document Reviewed: 04/21/2021 Elsevier Patient Education  2023 Elsevier Inc.  

## 2022-06-29 ENCOUNTER — Other Ambulatory Visit: Payer: Self-pay | Admitting: Family Medicine

## 2022-08-04 ENCOUNTER — Ambulatory Visit (INDEPENDENT_AMBULATORY_CARE_PROVIDER_SITE_OTHER): Payer: Medicare Other | Admitting: Family Medicine

## 2022-08-04 ENCOUNTER — Encounter: Payer: Self-pay | Admitting: Family Medicine

## 2022-08-04 VITALS — BP 162/86 | HR 66 | Temp 97.6°F | Ht 63.0 in | Wt 153.8 lb

## 2022-08-04 DIAGNOSIS — B370 Candidal stomatitis: Secondary | ICD-10-CM

## 2022-08-04 DIAGNOSIS — R0981 Nasal congestion: Secondary | ICD-10-CM

## 2022-08-04 DIAGNOSIS — F17210 Nicotine dependence, cigarettes, uncomplicated: Secondary | ICD-10-CM | POA: Diagnosis not present

## 2022-08-04 DIAGNOSIS — R1010 Upper abdominal pain, unspecified: Secondary | ICD-10-CM | POA: Diagnosis not present

## 2022-08-04 DIAGNOSIS — F4321 Adjustment disorder with depressed mood: Secondary | ICD-10-CM

## 2022-08-04 DIAGNOSIS — T485X5A Adverse effect of other anti-common-cold drugs, initial encounter: Secondary | ICD-10-CM

## 2022-08-04 DIAGNOSIS — K5900 Constipation, unspecified: Secondary | ICD-10-CM

## 2022-08-04 MED ORDER — MAGIC MOUTHWASH W/LIDOCAINE: ORAL | 0 refills | Status: DC

## 2022-08-04 NOTE — Progress Notes (Signed)
Established Patient Office Visit   Subjective  Patient ID: Kimberly Stein, female    DOB: 06-07-1955  Age: 68 y.o. MRN: [id number]  Chief Complaint  Patient presents with   Nasal Congestion    Patient complains of nasal congestion, x1 year, Tried Afrin nasal spray with some relief    Rib pain    Patient is a 67 year old female followed by Dr. Elease Hashimoto and seen 40 ongoing chronic concerns.  Patient endorses abdominal pain x 3-4 years.  States has a cramping, twisting feeling in upper abdomen left side greater than the right.  Occurs if coughs, sneezes, or laughs too hard, or with twisting motions.  May last 10-15 minutes.  Some relief if pushes on side of abdomen.  Patient does not drink water.  Patient denies constipation.  Endorses hard stools.  Had a BM yesterday, and 3 days prior.  Endorses history of GERD and hiatal hernia.  Has not seen GI in a while.  Needs to schedule colonoscopy.  Patient with nasal congestion x 1.5 years.  Using Afrin daily.  Feels like chronic congestion and sleeping with her mouth open have caused dental problems.  Patient endorses feeling down 2/2 the death of her dog.  Now smoking 1.5 packs/day, was smoking 1 pack/day prior to losing her dog.      ROS Negative unless stated above    Objective:     BP (!) 162/86 (BP Location: Left Arm, Patient Position: Sitting, Cuff Size: Normal)   Pulse 66   Temp 97.6 F (36.4 C) (Oral)   Ht '5\' 3"'$  (1.6 m)   Wt 153 lb 12.8 oz (69.8 kg)   SpO2 98%   BMI 27.24 kg/m    Physical Exam Constitutional:      General: She is not in acute distress.    Appearance: Normal appearance.     Comments: Tearful.  HENT:     Head: Normocephalic and atraumatic.     Nose: Nose normal.     Mouth/Throat:     Mouth: Mucous membranes are moist.  Eyes:     Extraocular Movements: Extraocular movements intact.     Conjunctiva/sclera: Conjunctivae normal.     Pupils: Pupils are equal, round, and reactive to light.   Cardiovascular:     Rate and Rhythm: Normal rate and regular rhythm.     Heart sounds: Normal heart sounds. No murmur heard.    No gallop.  Pulmonary:     Effort: Pulmonary effort is normal. No respiratory distress.     Breath sounds: Normal breath sounds. No wheezing, rhonchi or rales.  Abdominal:     General: Bowel sounds are normal. There is no distension.     Palpations: Abdomen is soft. There is no mass.     Tenderness: There is no abdominal tenderness. There is no guarding or rebound.     Hernia: No hernia is present.  Skin:    General: Skin is warm and dry.  Neurological:     Mental Status: She is alert and oriented to person, place, and time.      No results found for any visits on 08/04/22.    Assessment & Plan:  Pain of upper abdomen -Advised symptoms likely 2/2 constipation versus worsening hiatal hernia -Patient encouraged to take MiraLAX daily and increase intake of water -For continued or worsening symptoms follow-up with GI -Also advised to schedule colonoscopy with GI  Thrush -     magic mouthwash w/lidocaine; Swish and spit 5 mL 4  times daily as needed x 5 days.  Dispense: 200 mL; Refill: 0  Cigarette nicotine dependence without complication -Smoking [id number] packs/day -Advised to consider cutting down -Offer quit aids   Grief -Patient advised to consider counseling  Nasal congestion due to prolonged use of decongestants -Start using Afrin in 1 nostril at a time to wean self off of medication.  Constipation, unspecified constipation type -Lifestyle modifications -MiraLAX daily or twice daily.  Decrease once tobacco desired effect achieved.   Return in about 2 weeks (around 08/18/2022) for with pcp.   Billie Ruddy, MD

## 2022-08-04 NOTE — Patient Instructions (Signed)
Do not forget to call your gastroenterologist and set up an appointment.  You can take over-the-counter MiraLAX 1 cap once or twice per day until you start having regular bowel movements.  You can then decrease the amount to 1 Daily or every few days based on results.  Consider counseling to help with grief.  You will need to wean yourself off of Afrin.  Stop using medication in 1 nostril.  You will be congested in that nostril for several days.  Once the congestion resolves stop using it and the other nostril.

## 2022-08-21 ENCOUNTER — Other Ambulatory Visit: Payer: Self-pay | Admitting: Family Medicine

## 2022-09-12 ENCOUNTER — Other Ambulatory Visit: Payer: Self-pay | Admitting: *Deleted

## 2022-09-12 MED ORDER — CYCLOBENZAPRINE HCL 5 MG PO TABS: 5.0000 mg | ORAL_TABLET | Freq: Every evening | ORAL | 2 refills | Status: DC | PRN

## 2022-09-12 NOTE — Telephone Encounter (Signed)
Refill request received via fax from [facility] for Cyclobenzaprine   Last Fill: 04/06/2022  Next Visit: 12/09/2022  Last Visit: 06/02/2022  Dx: Fibromyalgia   Current Dose per office note on 06/02/2022: Flexeril 5 mg 1 tablet at bedtime as needed for muscle spasms   Okay to refill Cyclobezaprine?

## 2022-09-19 ENCOUNTER — Other Ambulatory Visit: Payer: Self-pay | Admitting: Family Medicine

## 2022-09-20 ENCOUNTER — Telehealth: Payer: Self-pay | Admitting: Family Medicine

## 2022-09-20 MED ORDER — ALBUTEROL SULFATE HFA 108 (90 BASE) MCG/ACT IN AERS: 2.0000 | INHALATION_SPRAY | Freq: Two times a day (BID) | RESPIRATORY_TRACT | 0 refills | Status: DC

## 2022-09-20 NOTE — Telephone Encounter (Signed)
Rx sent 

## 2022-09-20 NOTE — Telephone Encounter (Signed)
Prescription Request  09/20/2022  LOV: 01/21/2022  What is the name of the medication or equipment? albuterol (VENTOLIN HFA) 108 (90 Base) MCG/ACT inhaler  Have you contacted your pharmacy to request a refill? Yes   Which pharmacy would you like this sent to?  [id number] - [place], [place] - [address] [place] [address] Phone: [phone] Fax: [phone]    Patient notified that their request is being sent to the clinical staff for review and that they should receive a response within 2 business days.   Please advise at Mobile [phone] (mobile)

## 2022-09-20 NOTE — Addendum Note (Signed)
Addended by: Christy Sartorius on: 09/20/2022 10:47 AM   Modules accepted: Orders

## 2022-11-07 ENCOUNTER — Other Ambulatory Visit: Payer: Self-pay | Admitting: Family Medicine

## 2022-11-10 MED ORDER — ALBUTEROL SULFATE HFA 108 (90 BASE) MCG/ACT IN AERS: 2.0000 | INHALATION_SPRAY | Freq: Two times a day (BID) | RESPIRATORY_TRACT | 0 refills | Status: DC

## 2022-11-10 NOTE — Addendum Note (Signed)
Addended by: Christy Sartorius on: 11/10/2022 02:09 PM   Modules accepted: Orders

## 2022-11-10 NOTE — Telephone Encounter (Signed)
Pt called to say the pharmacy told her this Rx was denied and she would like to know why?  Pt is scheduled for a Medicare AWV on 02/08/23.  01/21/22 = Last CPE  Please advise.

## 2022-11-28 NOTE — Progress Notes (Signed)
Office Visit Note  Patient: Kimberly Stein             Date of Birth: [birth year]           MRN: [id number]             PCP: Kimberly Covey, MD Referring: Kimberly Covey, MD Visit Date: 12/09/2022 Occupation: @GUAROCC @  Subjective:  Intermittent lower back pain  History of Present Illness: Kimberly Stein is a 67 y.o. female with fibromyalgia, osteoarthritis and degenerative disc disease.  She states she has been under a lot of stress.  She lost her sister and her dog within the last year.  She was tearful during the conversation.  She states that her fibromyalgia symptoms are tolerable.  She continues to have some generalized muscle weakness which she believes is due to deconditioning.  She states that left trochanteric bursitis is better and has occasional discomfort in that region.  Her hands and right shoulder joint discomfort is better.  She continues to have some stiffness in her neck.  She has intermittent discomfort in her lower back.  She has been experiencing increased fatigue.  Which she relates to being under stress.  She states she has been taking Flexeril 5 mg every other night.  She sleeps better when she takes Flexeril.  She states she was also experiencing abdominal cramps which got better after she started taking fiber supplement.  She denies any history of joint swelling.   Activities of Daily Living:  Patient reports morning stiffness for 1 hour.   Patient Reports nocturnal pain.  Difficulty dressing/grooming: Denies Difficulty climbing stairs: Denies Difficulty getting out of chair: Denies Difficulty using hands for taps, buttons, cutlery, and/or writing: Denies  Review of Systems  Constitutional:  Positive for fatigue.  HENT:  Negative for mouth sores and mouth dryness.   Eyes:  Negative for dryness.  Respiratory:  Negative for shortness of breath.   Cardiovascular:  Negative for chest pain and palpitations.  Gastrointestinal:  Negative for blood in  stool, constipation and diarrhea.  Endocrine: Negative for increased urination.  Genitourinary:  Negative for involuntary urination.  Musculoskeletal:  Positive for joint pain, joint pain, myalgias, morning stiffness and myalgias. Negative for gait problem, joint swelling, muscle weakness and muscle tenderness.  Skin:  Negative for color change, rash, hair loss and sensitivity to sunlight.  Allergic/Immunologic: Negative for susceptible to infections.  Neurological:  Negative for dizziness and headaches.  Hematological:  Negative for swollen glands.  Psychiatric/Behavioral:  Positive for sleep disturbance. Negative for depressed mood. The patient is not nervous/anxious.     PMFS History:  Patient Active Problem List   Diagnosis Date Noted   Pain in right shoulder 10/29/2020   Low back pain 11/26/2019   Impingement syndrome of right shoulder 11/26/2019   Belching 04/17/2019   Screening for viral disease 04/17/2019   Other fatigue 06/13/2016   Primary insomnia 06/13/2016   Primary osteoarthritis of both hands 06/13/2016   Trochanteric bursitis of both hips 06/13/2016   DDD cervical spine status post fusion 06/13/2016   Facet arthritis, degenerative, lumbar spine 06/13/2016   Tendinopathy of right rotator cuff 06/13/2016   Chest pain, atypical 02/25/2015   Abnormal nuclear stress test 02/25/2015   Hyperlipidemia 07/17/2013   Osteopenia 01/28/2012   OPIOID DEPENDENCE 03/09/2010   CELIAC SPRUE 09/29/2009   Hypothyroidism 09/07/2009   Meniere's disease 09/07/2009   Essential hypertension 09/07/2009   RUQ PAIN 09/07/2009   Fibromyalgia 06/12/2009   CERVICALGIA  06/03/2009   ANEMIA-IRON DEFICIENCY 10/21/2008   Gastroesophageal reflux disease 10/21/2008   DIZZINESS 10/21/2008   COLONIC POLYPS, HX OF 10/21/2008    Past Medical History:  Diagnosis Date   Allergy    sinus infections   Anemia    Arthritis    Celiac disease    Fibromyalgia    GERD (gastroesophageal reflux disease)     Heart murmur    Hiatal hernia    Hyperlipidemia    Hypertension    Meniere's disease    Neuromuscular disorder (HCC)    fibromyalgia   Thyroid disease    hypothyroid   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    Family History  Problem Relation Age of Onset   Celiac disease Mother    Diabetes Mother    Heart disease Mother    Prostate cancer Father    Diabetes Sister    Celiac disease Sister    Diabetes Sister    Breast cancer Maternal Aunt    Colon cancer Neg Hx    Esophageal cancer Neg Hx    Stomach cancer Neg Hx    Rectal cancer Neg Hx    Liver cancer Neg Hx    Pancreatic cancer Neg Hx    Past Surgical History:  Procedure Laterality Date   CARDIAC CATHETERIZATION N/A 02/25/2015   Procedure: Left Heart Cath and Coronary Angiography;  Surgeon: Peter M Swaziland, MD;  Location: [facility] INVASIVE CV LAB;  Service: Cardiovascular;  Laterality: N/A;   CERVICAL DISC SURGERY     CHOLECYSTECTOMY     COLONOSCOPY     DILATION AND CURETTAGE OF UTERUS     HAND SURGERY Right    right hand; calcium deposit on wrist and under thumb and ganglion cyst and carpal tunnel   lumbar radiofrequency nerve ablation     twice yearly   MOUTH SURGERY  2016   couple times due to gum disease   ROTATOR CUFF REPAIR Left 06/07/2015   left shoulder   TOTAL ABDOMINAL HYSTERECTOMY     UPPER GASTROINTESTINAL ENDOSCOPY     Social History   Social History Narrative   Not on file   Immunization History  Administered Date(s) Administered   PFIZER(Purple Top)SARS-COV-2 Vaccination 01/06/2020, 01/31/2020   Pneumococcal Conjugate-13 07/17/2013   Tdap 06/26/2013   Zoster, Live 07/17/2013     Objective: Vital Signs: BP (!) 162/91 (BP Location: Left Arm, Patient Position: Sitting, Cuff Size: Normal)   Pulse 74   Resp 17   Ht 5' 2.5" (1.588 m)   Wt 157 lb 3.2 oz (71.3 kg)   BMI 28.29 kg/m    Physical Exam Vitals and nursing note reviewed.  Constitutional:      Appearance: She is well-developed.   HENT:     Head: Normocephalic and atraumatic.  Eyes:     Conjunctiva/sclera: Conjunctivae normal.  Cardiovascular:     Rate and Rhythm: Normal rate and regular rhythm.     Heart sounds: Normal heart sounds.  Pulmonary:     Effort: Pulmonary effort is normal.     Breath sounds: Normal breath sounds.  Abdominal:     General: Bowel sounds are normal.     Palpations: Abdomen is soft.  Musculoskeletal:     Cervical back: Normal range of motion.  Lymphadenopathy:     Cervical: No cervical adenopathy.  Skin:    General: Skin is warm and dry.     Capillary Refill: Capillary refill takes less than 2 seconds.  Neurological:  Mental Status: She is alert and oriented to person, place, and time.  Psychiatric:        Behavior: Behavior normal.      Musculoskeletal Exam: She had some limitation with lateral rotation of the cervical spine.  She had good flexion and extension.  She had thoracic kyphosis.  She had discomfort with range of motion of the lumbar spine.  Shoulder joints, elbow joints, wrist joints, MCPs PIPs and DIPs were in good range of motion.  She had mild PIP and DIP thickening.  Hip joints and knee joints were in good range of motion without any warmth swelling or effusion.  There was no tenderness over ankles or MTPs.  She had generalized hyperalgesia but no tender points.  CDAI Exam: CDAI Score: -- Patient Global: --; Provider Global: -- Swollen: --; Tender: -- Joint Exam 12/09/2022   No joint exam has been documented for this visit   There is currently no information documented on the homunculus. Go to the Rheumatology activity and complete the homunculus joint exam.  Investigation: No additional findings.  Imaging: No results found.  Recent Labs: Lab Results  Component Value Date   WBC 7.7 01/21/2022   HGB 14.4 01/21/2022   PLT 165.0 01/21/2022   NA 139 01/21/2022   K 4.3 01/21/2022   CL 99 01/21/2022   CO2 31 01/21/2022   GLUCOSE 104 (H) 01/21/2022    BUN 18 01/21/2022   CREATININE 0.81 01/21/2022   BILITOT 0.3 01/21/2022   ALKPHOS 72 01/21/2022   AST 23 01/21/2022   ALT 21 01/21/2022   PROT 8.2 01/21/2022   ALBUMIN 4.5 01/21/2022   CALCIUM 10.6 (H) 01/21/2022   GFRAA 85 12/12/2016    Speciality Comments: No specialty comments available.  Procedures:  No procedures performed Allergies: Crestor [rosuvastatin], Lipitor [atorvastatin calcium], Nsaids, Nortriptyline, Nortriptyline hcl, and Vicodin [hydrocodone-acetaminophen]   Assessment / Plan:     Visit Diagnoses: Fibromyalgia-she continues to have some generalized pain and fatigue.  She states the pain is manageable.  She has been under a lot of stress that she lost her sister and her dog within the last year.  She states she has not been very active and feels some generalized deconditioning.  Need for regular exercise was emphasized.  Benefits of water aerobics and stretching were discussed.  Trochanteric bursitis of both hips-trochanteric bursitis has improved.  Need for doing the stretches on a regular basis was emphasized.  Primary osteoarthritis of both hands-she has mild PIP and DIP thickening.  No synovitis was noted.  Joint protection was discussed.  Tendinopathy of right rotator cuff-she is currently not having much discomfort.  DDD (degenerative disc disease), cervical - s/p fusion: She has limited range of motion of the cervical spine.  She denies any discomfort today.  She gets intermittent discomfort.  DDD (degenerative disc disease), lumbar -she continues to have intermittent lower back pain.  She is followed by Dr. Ollen Bowl.  Core strengthening exercises were emphasized.  She takes Flexeril 5 mg p.o. nightly every other night for muscle spasms.  Other fatigue-related to fibromyalgia.  Primary insomnia-she has been experiencing insomnia.  Good sleep hygiene was discussed.  Osteopenia of multiple sites - DEXA on 07/19/2018 right femoral neck BMD 0.814 with T score  -0.3.  Calcium rich diet with vitamin D and resistive exercises were emphasized.  Other medical problems are listed as follows:  History of hyperlipidemia  History of anemia  History of Meniere's disease  History of hypertension - BP 156/126  repeat blood pressure was 162/91.  Patient was advised to monitor blood pressure closely and follow-up with her PCP.  History of gastroesophageal reflux (GERD)  History of hypothyroidism  Orders: No orders of the defined types were placed in this encounter.  No orders of the defined types were placed in this encounter.    Follow-Up Instructions: Return in about 6 months (around 06/11/2023) for FMS, OA.   Pollyann Savoy, MD  Note - This record has been created using Animal nutritionist.  Chart creation errors have been sought, but may not always  have been located. Such creation errors do not reflect on  the standard of medical care.

## 2022-12-01 ENCOUNTER — Other Ambulatory Visit: Payer: Self-pay | Admitting: Family Medicine

## 2022-12-01 DIAGNOSIS — Z1231 Encounter for screening mammogram for malignant neoplasm of breast: Secondary | ICD-10-CM

## 2022-12-08 ENCOUNTER — Other Ambulatory Visit: Payer: Self-pay | Admitting: Physician Assistant

## 2022-12-08 NOTE — Telephone Encounter (Signed)
Last Fill: 09/12/2022  Next Visit: 12/09/2022  Last Visit: 06/02/2022  DX:  Fibromyalgia   Current Dose per office note 06/02/2022: Flexeril 5 mg 1 tablet at bedtime as needed for muscle spasms   Okay to refill Flexeril?

## 2022-12-09 ENCOUNTER — Ambulatory Visit: Payer: Medicare Other | Attending: Rheumatology | Admitting: Rheumatology

## 2022-12-09 ENCOUNTER — Encounter: Payer: Self-pay | Admitting: Rheumatology

## 2022-12-09 VITALS — BP 162/91 | HR 74 | Resp 17 | Ht 62.5 in | Wt 157.2 lb

## 2022-12-09 DIAGNOSIS — M797 Fibromyalgia: Secondary | ICD-10-CM

## 2022-12-09 DIAGNOSIS — M19041 Primary osteoarthritis, right hand: Secondary | ICD-10-CM | POA: Insufficient documentation

## 2022-12-09 DIAGNOSIS — M51369 Other intervertebral disc degeneration, lumbar region without mention of lumbar back pain or lower extremity pain: Secondary | ICD-10-CM

## 2022-12-09 DIAGNOSIS — Z8639 Personal history of other endocrine, nutritional and metabolic disease: Secondary | ICD-10-CM | POA: Diagnosis present

## 2022-12-09 DIAGNOSIS — M5136 Other intervertebral disc degeneration, lumbar region: Secondary | ICD-10-CM

## 2022-12-09 DIAGNOSIS — M8589 Other specified disorders of bone density and structure, multiple sites: Secondary | ICD-10-CM | POA: Diagnosis present

## 2022-12-09 DIAGNOSIS — Z8719 Personal history of other diseases of the digestive system: Secondary | ICD-10-CM | POA: Diagnosis present

## 2022-12-09 DIAGNOSIS — M19042 Primary osteoarthritis, left hand: Secondary | ICD-10-CM | POA: Diagnosis present

## 2022-12-09 DIAGNOSIS — M7061 Trochanteric bursitis, right hip: Secondary | ICD-10-CM

## 2022-12-09 DIAGNOSIS — M7062 Trochanteric bursitis, left hip: Secondary | ICD-10-CM | POA: Diagnosis present

## 2022-12-09 DIAGNOSIS — Z862 Personal history of diseases of the blood and blood-forming organs and certain disorders involving the immune mechanism: Secondary | ICD-10-CM

## 2022-12-09 DIAGNOSIS — Z8679 Personal history of other diseases of the circulatory system: Secondary | ICD-10-CM | POA: Diagnosis present

## 2022-12-09 DIAGNOSIS — M67911 Unspecified disorder of synovium and tendon, right shoulder: Secondary | ICD-10-CM | POA: Diagnosis present

## 2022-12-09 DIAGNOSIS — R5383 Other fatigue: Secondary | ICD-10-CM

## 2022-12-09 DIAGNOSIS — M5030 Other cervical disc degeneration, unspecified cervical region: Secondary | ICD-10-CM

## 2022-12-09 DIAGNOSIS — F5101 Primary insomnia: Secondary | ICD-10-CM

## 2022-12-09 DIAGNOSIS — Z8669 Personal history of other diseases of the nervous system and sense organs: Secondary | ICD-10-CM | POA: Diagnosis present

## 2022-12-23 ENCOUNTER — Ambulatory Visit: Payer: Medicare Other | Admitting: Family Medicine

## 2022-12-23 ENCOUNTER — Encounter: Payer: Self-pay | Admitting: Family Medicine

## 2022-12-23 VITALS — BP 146/78 | HR 89 | Temp 97.9°F | Ht 62.5 in | Wt 153.5 lb

## 2022-12-23 DIAGNOSIS — R109 Unspecified abdominal pain: Secondary | ICD-10-CM | POA: Diagnosis not present

## 2022-12-23 DIAGNOSIS — K5900 Constipation, unspecified: Secondary | ICD-10-CM

## 2022-12-23 MED ORDER — DICYCLOMINE HCL 20 MG PO TABS: 20.0000 mg | ORAL_TABLET | Freq: Four times a day (QID) | ORAL | 0 refills | Status: DC | PRN

## 2022-12-23 MED ORDER — ALBUTEROL SULFATE HFA 108 (90 BASE) MCG/ACT IN AERS
2.0000 | INHALATION_SPRAY | Freq: Four times a day (QID) | RESPIRATORY_TRACT | 2 refills | Status: AC | PRN
Start: 2022-12-23 — End: ?

## 2022-12-23 NOTE — Progress Notes (Signed)
Established Patient Office Visit  Subjective   Patient ID: REIA VIERNES, female    DOB: 10-19-1955  Age: 67 y.o. MRN: [id number]  Chief Complaint  Patient presents with   Abdominal Cramping    Patient complains of abdominal cramping     HPI   Ricquel is seen to discuss some recent intermittent abdominal cramp-like sensation.  She has had these frequently in her left upper quadrant region.  These can come on fairly acutely and be intense for several minutes and then subside.  She has had some chronic constipation issues.  No diarrhea.  No bloody stools.  Last colonoscopy 2019.  She had very stressful year.  She had a sister that died age 73 in a nursing home secondary to pneumonia.  She states her dog died this past winter and this has been very difficult for her.  Regarding her constipation she has been using some Dulcolax.  She knows she does not drink enough water.  She does have hypothyroidism which has been adequately replaced by labs last September.  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   Past Surgical History:  Procedure Laterality Date   CARDIAC CATHETERIZATION N/A 02/25/2015   Procedure: Left Heart Cath and Coronary Angiography;  Surgeon: Peter M Swaziland, MD;  Location: [facility] INVASIVE CV LAB;  Service: Cardiovascular;  Laterality: N/A;   CERVICAL DISC SURGERY     CHOLECYSTECTOMY     COLONOSCOPY     DILATION AND CURETTAGE OF UTERUS     HAND SURGERY Right    right hand; calcium deposit on wrist and under thumb and ganglion cyst and carpal tunnel   lumbar radiofrequency nerve ablation     twice yearly   MOUTH SURGERY  2016    couple times due to gum disease   ROTATOR CUFF REPAIR Left 06/07/2015   left shoulder   TOTAL ABDOMINAL HYSTERECTOMY     UPPER GASTROINTESTINAL ENDOSCOPY      reports that she has been smoking cigarettes. She has a 46 pack-year smoking history. She has never been exposed to tobacco smoke. She has never used smokeless tobacco. She reports that she does not drink alcohol and does not use drugs. family history includes Breast cancer in her maternal aunt; Celiac disease in her mother and sister; Diabetes in her mother, sister, and sister; Heart disease in her mother; Prostate cancer in her father. Allergies  Allergen Reactions   Crestor [Rosuvastatin] Other (See Comments)    Bilateral Leg pain   Lipitor [Atorvastatin Calcium]     Muscle aches   Nsaids    Nortriptyline Rash   Nortriptyline Hcl Rash    Red rash   Vicodin [Hydrocodone-Acetaminophen] Itching    Review of Systems  Constitutional:  Negative for chills, fever and weight loss.  Respiratory:  Negative for cough and hemoptysis.   Cardiovascular:  Negative for chest pain.  Gastrointestinal:  Positive for abdominal pain and constipation. Negative for blood in stool, diarrhea, melena, nausea and vomiting.      Objective:  BP (!) 146/78 (BP Location: Left Arm, Patient Position: Sitting, Cuff Size: Normal)   Pulse 89   Temp 97.9 F (36.6 C) (Oral)   Ht 5' 2.5" (1.588 m)   Wt 153 lb 8 oz (69.6 kg)   SpO2 97%   BMI 27.63 kg/m  BP Readings from Last 3 Encounters:  12/23/22 (!) 146/78  12/09/22 (!) 162/91  08/04/22 (!) 162/86   Wt Readings from Last 3 Encounters:  12/23/22 153 lb 8 oz (69.6 kg)  12/09/22 157 lb 3.2 oz (71.3 kg)  08/04/22 153 lb 12.8 oz (69.8 kg)      Physical Exam Vitals reviewed.  Constitutional:      General: She is not in acute distress.    Appearance: Normal appearance. She is not ill-appearing.  Cardiovascular:     Rate and Rhythm: Normal rate and regular rhythm.  Pulmonary:     Effort:  Pulmonary effort is normal.     Breath sounds: Normal breath sounds.  Abdominal:     Palpations: Abdomen is soft. There is no mass.     Tenderness: There is no abdominal tenderness. There is no guarding or rebound.  Neurological:     Mental Status: She is alert.      No results found for any visits on 12/23/22.  Last CBC Lab Results  Component Value Date   WBC 7.7 01/21/2022   HGB 14.4 01/21/2022   HCT 42.3 01/21/2022   MCV 93.2 01/21/2022   MCH 32.0 06/30/2017   RDW 13.6 01/21/2022   PLT 165.0 01/21/2022   Last metabolic panel Lab Results  Component Value Date   GLUCOSE 104 (H) 01/21/2022   NA 139 01/21/2022   K 4.3 01/21/2022   CL 99 01/21/2022   CO2 31 01/21/2022   BUN 18 01/21/2022   CREATININE 0.81 01/21/2022   GFR 75.82 01/21/2022   CALCIUM 10.6 (H) 01/21/2022   PHOS 3.5 12/12/2016   PROT 8.2 01/21/2022   ALBUMIN 4.5 01/21/2022   BILITOT 0.3 01/21/2022   ALKPHOS 72 01/21/2022   AST 23 01/21/2022   ALT 21 01/21/2022   Last thyroid functions Lab Results  Component Value Date   TSH 4.43 01/21/2022      The 10-year ASCVD risk score (Arnett DK, et al., 2019) is: 24.6%    Assessment & Plan:   Nola is seen with intermittent abdominal cramp-like symptoms and chronic intermittent constipation.  Denies any concerning symptoms such as weight loss, bloody stools, change in appetite, fever, etc.  -We discussed management of constipation with increased water intake, aim for 25 g fiber per day, stool softeners as needed, and MiraLAX.  Stay away from regular use of stimulant laxatives.  Handout given. -Consider short-term as needed use of Bentyl 20 mg every 6 hours as needed for severe cramps -She has pending follow-up with GI in the fall. -Schedule physical for sometime around September and repeat labs then    Evelena Peat, MD

## 2022-12-23 NOTE — Patient Instructions (Signed)
Try the Miralax for constipation, as discussed.

## 2022-12-28 ENCOUNTER — Ambulatory Visit: Payer: Medicare Other

## 2023-01-05 ENCOUNTER — Encounter (INDEPENDENT_AMBULATORY_CARE_PROVIDER_SITE_OTHER): Payer: Self-pay

## 2023-01-14 ENCOUNTER — Other Ambulatory Visit: Payer: Self-pay | Admitting: Family Medicine

## 2023-01-19 ENCOUNTER — Ambulatory Visit (INDEPENDENT_AMBULATORY_CARE_PROVIDER_SITE_OTHER): Payer: Medicare Other | Admitting: Physician Assistant

## 2023-01-19 ENCOUNTER — Other Ambulatory Visit (INDEPENDENT_AMBULATORY_CARE_PROVIDER_SITE_OTHER): Payer: Medicare Other

## 2023-01-19 ENCOUNTER — Encounter: Payer: Self-pay | Admitting: Physician Assistant

## 2023-01-19 ENCOUNTER — Ambulatory Visit: Payer: Medicare Other

## 2023-01-19 DIAGNOSIS — M79672 Pain in left foot: Secondary | ICD-10-CM

## 2023-01-19 MED ORDER — COLCHICINE 0.6 MG PO TABS: 0.6000 mg | ORAL_TABLET | Freq: Two times a day (BID) | ORAL | 0 refills | Status: DC

## 2023-01-19 NOTE — Progress Notes (Signed)
Office Visit Note   Patient: Kimberly Stein           Date of Birth: [birth year]           MRN: [id number] Visit Date: 01/19/2023              Requested by: Kristian Covey, MD [address] [place],  [place] [address] PCP: Kristian Covey, MD  Chief Complaint  Patient presents with   Left Foot - Pain    GRT Toe      HPI: Patient is a pleasant 67 year old woman with a 24-hour history of left foot great toe pain.  Denies any injury denies any change in activity.  The great toe is quite swollen and painful to bear weight hurts without being dependent on weightbearing.  Denies any fever chills or systemic symptoms.  She has been using some Voltaren gel which seems to help a little bit.  She does have a remote history of gout in this toe in the past but discontinued her medication because her toe did not hurt anymore.  Describes her pain as moderately severe  Assessment & Plan: Visit Diagnoses:  1. 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  Will start her on some colchicine.  Also will draw a uric acid today.  If indeed it is gout hopefully this will relieve her symptoms and then she could follow-up with her primary care provider for regular prophylaxis.  Gave her return precautions regarding fever chills or change.  I offered her a postop shoe today she declined  Follow-Up Instructions: No follow-ups on file.   Ortho Exam  Patient is alert, oriented, no adenopathy, well-dressed, normal affect, normal respiratory effort. Examination she has mild soft tissue swelling about the great toe.  No redness no ascending cellulitis she is neurovascularly intact compartments are soft and nontender she can extend and flex her toe.  She is more focally tender to the MTP joint and over the tibial sesamoid.  Imaging: No results found. No images are attached to  the encounter.  Labs: Lab Results  Component Value Date   HGBA1C 6.1 01/21/2022   HGBA1C 5.8 02/29/2016     Lab Results  Component Value Date   ALBUMIN 4.5 01/21/2022   ALBUMIN 4.7 10/16/2020   ALBUMIN 4.5 08/21/2019    Lab Results  Component Value Date   MG 1.6 12/12/2016   Lab Results  Component Value Date   VD25OH 40 12/12/2016   VD25OH 56 06/26/2013   VD25OH 89 10/23/2009    No results found for: "PREALBUMIN"    Latest Ref Rng & Units 01/21/2022    7:34 AM 10/16/2020    8:01 AM 08/21/2019    7:46 AM  CBC EXTENDED  WBC 4.0 - 10.5 K/uL 7.7  6.7  8.0   RBC 3.87 - 5.11 Mil/uL 4.54  4.14  4.07   Hemoglobin 12.0 - 15.0 g/dL 62.1  30.8  65.7   HCT 36.0 - 46.0 % 42.3  38.9  38.9   Platelets 150.0 - 400.0 K/uL 165.0  184.0  199.0   NEUT# 1.4 - 7.7 K/uL 4.3  3.3  3.8   Lymph# 0.7 - 4.0 K/uL 2.8  2.9  3.7      There is no height or weight on file to calculate BMI.  Orders:  Orders Placed This Encounter  Procedures   XR Foot 2 Views Left   Meds ordered this encounter  Medications   colchicine 0.6 MG tablet    Sig: Take 1 tablet (0.6 mg total) by mouth 2 (two) times daily.    Dispense:  20 tablet    Refill:  0     Procedures: No procedures performed  Clinical Data: No additional findings.  ROS:  All other systems negative, except as noted in the HPI. Review of Systems  Objective: Vital Signs: There were no vitals taken for this visit.  Specialty Comments:  No specialty comments available.  PMFS History: Patient Active Problem List   Diagnosis Date Noted   Pain in right shoulder 10/29/2020   Low back pain 11/26/2019   Impingement syndrome of right shoulder 11/26/2019   Belching 04/17/2019   Screening for viral disease 04/17/2019   Other fatigue 06/13/2016   Primary insomnia 06/13/2016   Primary osteoarthritis of both hands 06/13/2016   Trochanteric bursitis of both hips 06/13/2016   DDD cervical spine status post fusion 06/13/2016   Facet  arthritis, degenerative, lumbar spine 06/13/2016   Tendinopathy of right rotator cuff 06/13/2016   Chest pain, atypical 02/25/2015   Abnormal nuclear stress test 02/25/2015   Hyperlipidemia 07/17/2013   Osteopenia 01/28/2012   OPIOID DEPENDENCE 03/09/2010   CELIAC SPRUE 09/29/2009   Hypothyroidism 09/07/2009   Meniere's disease 09/07/2009   Essential hypertension 09/07/2009   RUQ PAIN 09/07/2009   Fibromyalgia 06/12/2009   CERVICALGIA 06/03/2009   ANEMIA-IRON DEFICIENCY 10/21/2008   Gastroesophageal reflux disease 10/21/2008   DIZZINESS 10/21/2008   COLONIC POLYPS, HX OF 10/21/2008   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    Family History  Problem Relation Age of Onset   Celiac disease Mother    Diabetes Mother    Heart disease Mother    Prostate cancer Father    Diabetes Sister    Celiac disease Sister    Diabetes Sister    Breast cancer Maternal Aunt    Colon cancer Neg Hx    Esophageal cancer Neg Hx    Stomach cancer Neg Hx    Rectal cancer Neg Hx    Liver cancer Neg Hx    Pancreatic cancer Neg Hx     Past Surgical History:  Procedure Laterality Date   CARDIAC CATHETERIZATION N/A 02/25/2015   Procedure: Left Heart Cath and Coronary Angiography;  Surgeon: Peter M Swaziland, MD;  Location: [facility] INVASIVE CV LAB;  Service: Cardiovascular;  Laterality: N/A;   CERVICAL DISC SURGERY     CHOLECYSTECTOMY     COLONOSCOPY     DILATION AND CURETTAGE OF UTERUS     HAND SURGERY Right    right hand; calcium deposit on wrist and under thumb and ganglion cyst and carpal tunnel   lumbar radiofrequency nerve ablation     twice yearly    MOUTH SURGERY  2016   couple times due to gum disease   ROTATOR CUFF REPAIR Left 06/07/2015   left shoulder  TOTAL ABDOMINAL HYSTERECTOMY     UPPER GASTROINTESTINAL ENDOSCOPY     Social History   Occupational History   Occupation: Housewife  Tobacco Use   Smoking status: Every Day    Current packs/day: 1.00    Average packs/day: 1 pack/day for 46.0 years (46.0 ttl pk-yrs)    Types: Cigarettes    Passive exposure: Never   Smokeless tobacco: Never  Vaping Use   Vaping status: Never Used  Substance and Sexual Activity   Alcohol use: No    Alcohol/week: 0.0 standard drinks of alcohol   Drug use: No   Sexual activity: Not on file

## 2023-01-20 LAB — URIC ACID: Uric Acid, Serum: 9.2 mg/dL — ABNORMAL HIGH (ref 2.5–7.0)

## 2023-01-22 ENCOUNTER — Other Ambulatory Visit: Payer: Self-pay | Admitting: Family Medicine

## 2023-01-24 ENCOUNTER — Other Ambulatory Visit: Payer: Self-pay | Admitting: Family Medicine

## 2023-01-25 ENCOUNTER — Telehealth: Payer: Self-pay | Admitting: Family Medicine

## 2023-01-25 NOTE — Telephone Encounter (Signed)
Was directed by orthopedic provider to obtain allopurinol (ZYLOPRIM) 300 MG tablet

## 2023-01-26 NOTE — Telephone Encounter (Signed)
Spoke with the patient, informed her of the message below and scheduled an appt on 9/11.

## 2023-01-30 ENCOUNTER — Ambulatory Visit: Payer: Medicare Other | Admitting: Nurse Practitioner

## 2023-01-31 ENCOUNTER — Other Ambulatory Visit: Payer: Self-pay | Admitting: Family Medicine

## 2023-02-01 ENCOUNTER — Encounter: Payer: Self-pay | Admitting: Family Medicine

## 2023-02-01 ENCOUNTER — Ambulatory Visit (INDEPENDENT_AMBULATORY_CARE_PROVIDER_SITE_OTHER): Payer: Medicare Other | Admitting: Family Medicine

## 2023-02-01 VITALS — BP 124/72 | HR 86 | Temp 98.2°F | Ht 62.5 in | Wt 156.6 lb

## 2023-02-01 DIAGNOSIS — T485X5A Adverse effect of other anti-common-cold drugs, initial encounter: Secondary | ICD-10-CM | POA: Diagnosis not present

## 2023-02-01 DIAGNOSIS — J310 Chronic rhinitis: Secondary | ICD-10-CM | POA: Diagnosis not present

## 2023-02-01 DIAGNOSIS — M109 Gout, unspecified: Secondary | ICD-10-CM

## 2023-02-01 DIAGNOSIS — I10 Essential (primary) hypertension: Secondary | ICD-10-CM

## 2023-02-01 MED ORDER — POTASSIUM CHLORIDE CRYS ER 10 MEQ PO TBCR: EXTENDED_RELEASE_TABLET | ORAL | 3 refills | Status: DC

## 2023-02-01 MED ORDER — ALLOPURINOL 100 MG PO TABS: ORAL_TABLET | ORAL | 3 refills | Status: DC

## 2023-02-01 MED ORDER — COLCHICINE 0.6 MG PO TABS: 0.6000 mg | ORAL_TABLET | Freq: Every day | ORAL | 1 refills | Status: DC

## 2023-02-01 NOTE — Progress Notes (Signed)
Established Patient Office Visit  Subjective   Patient ID: Kimberly Stein, female    DOB: 03-28-1956  Age: 67 y.o. MRN: [id number]  Chief Complaint  Patient presents with   Medication Consultation    HPI   Kimberly Stein is here today to discuss the following items  She does have past history of gout.  She had several years where this was quiescent.  She had seen orthopedist recently with acute flareup left MTP joint.  She noted warmth and had fairly severe pain with no injury.  Was placed on colchicine and had prompt improvement.  Uric acid level 9.2.  She specifically would like to go on allopurinol for prevention.  She does take chlorthalidone.  No alcohol use.  Tries to stay well-hydrated.  She hopes to schedule complete physical for October.  Needing refills of her potassium.  She has hypertension treated with losartan and chlorthalidone.  Blood pressure is generally well-controlled.  She has what sounds like rhinitis medicamentosa.  She started using Afrin 4 times daily.  She knows she needs to taper off.  She has questions regarding how to taper back.  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   Past Surgical History:  Procedure Laterality Date   CARDIAC CATHETERIZATION N/A 02/25/2015   Procedure: Left Heart Cath and Coronary Angiography;  Surgeon: Peter M Swaziland, MD;  Location: [facility] INVASIVE CV LAB;  Service: Cardiovascular;  Laterality: N/A;   CERVICAL DISC SURGERY     CHOLECYSTECTOMY     COLONOSCOPY     DILATION AND CURETTAGE OF UTERUS     HAND SURGERY Right    right hand; calcium deposit on wrist and under thumb and ganglion cyst and  carpal tunnel   lumbar radiofrequency nerve ablation     twice yearly   MOUTH SURGERY  2016   couple times due to gum disease   ROTATOR CUFF REPAIR Left 06/07/2015   left shoulder   TOTAL ABDOMINAL HYSTERECTOMY     UPPER GASTROINTESTINAL ENDOSCOPY      reports that she has been smoking cigarettes. She has a 46 pack-year smoking history. She has never been exposed to tobacco smoke. She has never used smokeless tobacco. She reports that she does not drink alcohol and does not use drugs. family history includes Breast cancer in her maternal aunt; Celiac disease in her mother and sister; Diabetes in her mother, sister, and sister; Heart disease in her mother; Prostate cancer in her father. Allergies  Allergen Reactions   Crestor [Rosuvastatin] Other (See Comments)    Bilateral Leg pain   Lipitor [Atorvastatin Calcium]     Muscle aches   Nsaids    Nortriptyline Rash   Nortriptyline Hcl Rash    Red rash   Vicodin [Hydrocodone-Acetaminophen] Itching    Review of Systems  Constitutional:  Negative for chills, fever and malaise/fatigue.  Eyes:  Negative for blurred vision.  Respiratory:  Negative for shortness of breath.   Cardiovascular:  Negative for chest pain.  Neurological:  Negative for dizziness, weakness and headaches.  Objective:     BP 124/72 (BP Location: Left Arm, Cuff Size: Normal)   Pulse 86   Temp 98.2 F (36.8 C) (Oral)   Ht 5' 2.5" (1.588 m)   Wt 156 lb 9.6 oz (71 kg)   SpO2 96%   BMI 28.19 kg/m  BP Readings from Last 3 Encounters:  02/01/23 124/72  12/27/22 (!) 146/78  12/09/22 (!) 162/91   Wt Readings from Last 3 Encounters:  02/01/23 156 lb 9.6 oz (71 kg)  12/23/22 153 lb 8 oz (69.6 kg)  12/09/22 157 lb 3.2 oz (71.3 kg)      Physical Exam Vitals reviewed.  Constitutional:      Appearance: She is well-developed.  Neck:     Thyroid: No thyromegaly.     Vascular: No JVD.  Cardiovascular:     Rate and Rhythm: Normal rate and regular rhythm.      Heart sounds:     No gallop.  Pulmonary:     Effort: Pulmonary effort is normal. No respiratory distress.     Breath sounds: Normal breath sounds. No wheezing or rales.  Musculoskeletal:     Cervical back: Neck supple.     Right lower leg: No edema.     Left lower leg: No edema.  Neurological:     Mental Status: She is alert.      No results found for any visits on 02/01/23.  Last CBC Lab Results  Component Value Date   WBC 7.7 01/21/2022   HGB 14.4 01/21/2022   HCT 42.3 01/21/2022   MCV 93.2 01/21/2022   MCH 32.0 06/30/2017   RDW 13.6 01/21/2022   PLT 165.0 01/21/2022   Last metabolic panel Lab Results  Component Value Date   GLUCOSE 104 (H) 01/21/2022   NA 139 01/21/2022   K 4.3 01/21/2022   CL 99 01/21/2022   CO2 31 01/21/2022   BUN 18 01/21/2022   CREATININE 0.81 01/21/2022   GFR 75.82 01/21/2022   CALCIUM 10.6 (H) 01/21/2022   PHOS 3.5 12/12/2016   PROT 8.2 01/21/2022   ALBUMIN 4.5 01/21/2022   BILITOT 0.3 01/21/2022   ALKPHOS 72 01/21/2022   AST 23 01/21/2022   ALT 21 01/21/2022   Last lipids Lab Results  Component Value Date   CHOL 304 (H) 01/21/2022   HDL 47.80 01/21/2022   LDLCALC 143 (H) 10/16/2020   LDLDIRECT 193.0 01/21/2022   TRIG 340.0 (H) 01/21/2022   CHOLHDL 6 01/21/2022   Last hemoglobin A1c Lab Results  Component Value Date   HGBA1C 6.1 01/21/2022   Last thyroid functions Lab Results  Component Value Date   TSH 4.43 01/21/2022      The 10-year ASCVD risk score (Arnett DK, et al., 2019) is: 18.4%    Assessment & Plan:   #1 gout.  Patient several years ago took allopurinol with good success.  She would like to get back on this.  Recent uric acid 9.2% with recent acute flareup left MTP joint.  Start colchicine 0.6 mg once daily while building up her allopurinol dose.  Will start allopurinol 100 mg daily for 2 weeks then 200 mg daily for 2 weeks then 300 mg daily -Bring back in approximately 6 weeks to recheck uric acid  level -Discussed low purine diet.  Handout given -Stay well-hydrated  #2 hypertension.  Initially up but improved significantly after rest.  Continue chlorthalidone and losartan.  Needs follow-up labs and will check electrolytes with physical in October  #3 rhinitis medicamentosa.  Discussed importance for getting off Afrin.  She will gradually scaled back.  Suggested she try over-the-counter Flonase or Nasacort AQ while transitioning off  Evelena Peat, MD

## 2023-02-01 NOTE — Patient Instructions (Signed)
Set up follow up in about one month  Start the Allopurinol and once daily colchicine.

## 2023-02-08 ENCOUNTER — Ambulatory Visit (INDEPENDENT_AMBULATORY_CARE_PROVIDER_SITE_OTHER): Payer: Medicare Other

## 2023-02-08 VITALS — Ht 62.5 in | Wt 156.0 lb

## 2023-02-08 DIAGNOSIS — Z1211 Encounter for screening for malignant neoplasm of colon: Secondary | ICD-10-CM

## 2023-02-08 DIAGNOSIS — Z0000 Encounter for general adult medical examination without abnormal findings: Secondary | ICD-10-CM | POA: Diagnosis not present

## 2023-02-08 NOTE — Patient Instructions (Addendum)
Kimberly Stein , Thank you for taking time to come for your Medicare Wellness Visit. I appreciate your ongoing commitment to your health goals. Please review the following plan we discussed and let me know if I can assist you in the future.   Referrals/Orders/Follow-Ups/Clinician Recommendations:   This is a list of the screening recommended for you and due dates:  Health Maintenance  Topic Date Due   Zoster (Shingles) Vaccine (1 of 2) 12/18/1974   Screening for Lung Cancer  Never done   Pneumonia Vaccine (2 of 2 - PPSV23 or PCV20) 09/11/2013   COVID-19 Vaccine (3 - Pfizer risk series) 02/28/2020   Colon Cancer Screening  09/13/2020   DTaP/Tdap/Td vaccine (2 - Td or Tdap) 06/27/2023   Mammogram  12/24/2023   Medicare Annual Wellness Visit  02/08/2024   DEXA scan (bone density measurement)  Completed   Hepatitis C Screening  Completed   HPV Vaccine  Aged Out   Flu Shot  Discontinued    Advanced directives: (Copy Requested) Please bring a copy of your health care power of attorney and living will to the office to be added to your chart at your convenience.  Next Medicare Annual Wellness Visit scheduled for next year: Yes

## 2023-02-08 NOTE — Progress Notes (Signed)
Subjective:   Kimberly Stein is a 67 y.o. female who presents for Medicare Annual (Subsequent) preventive examination.  Visit Complete: Virtual  I connected with  [name] on 02/08/23 by a audio enabled telemedicine application and verified that I am speaking with the correct person using two identifiers.  Patient Location: Home  Provider Location: Home Office  I discussed the limitations of evaluation and management by telemedicine. The patient expressed understanding and agreed to proceed.   Vital Signs: Unable to obtain new vitals due to this being a telehealth visit.   Cardiac Risk Factors include: advanced age (>16men, >43 women);hypertension     Objective:    Today's Vitals   02/08/23 1311  Weight: 156 lb (70.8 kg)  Height: 5' 2.5" (1.588 m)   Body mass index is 28.08 kg/m.     02/08/2023    1:18 PM 02/03/2022   12:43 PM 11/17/2020    8:06 AM 09/13/2017    8:03 AM 09/09/2016   10:34 AM 07/27/2015   10:57 AM 02/25/2015    8:05 AM  Advanced Directives  Does Patient Have a Medical Advance Directive? Yes Yes Yes No Yes Yes Yes  Type of Estate agent of [place];Living will Healthcare Power of [place];Living will Healthcare Power of Agency;Living will  Healthcare Power of [place];Living will Healthcare Power of [place];Living will Healthcare Power of [place];Living will  Does patient want to make changes to medical advance directive?   No - Patient declined   No - Patient declined   Copy of Healthcare Power of Attorney in Chart? No - copy requested No - copy requested No - copy requested   No - copy requested No - copy requested  Would patient like information on creating a medical advance directive?    No - Patient declined  No - patient declined information     Current Medications (verified) Outpatient Encounter Medications as of 02/08/2023  Medication Sig   albuterol (VENTOLIN HFA) 108 (90 Base) MCG/ACT inhaler Inhale 2 puffs into the  lungs every 6 (six) hours as needed for wheezing or shortness of breath.   allopurinol (ZYLOPRIM) 100 MG tablet Take one tablet by mouth daily for two weeks, then take two tablets daily for two weeks, and then increase to three tablets daily.   Calcium Carb-Cholecalciferol (CALCIUM 600 + D PO) Take by mouth 2 (two) times daily.   chlorthalidone (HYGROTON) 25 MG tablet TAKE 1 TABLET(25 MG) BY MOUTH DAILY   colchicine 0.6 MG tablet Take 1 tablet (0.6 mg total) by mouth 2 (two) times daily.   colchicine 0.6 MG tablet Take 1 tablet (0.6 mg total) by mouth daily.   cyclobenzaprine (FLEXERIL) 5 MG tablet TAKE 1 TABLET(5 MG) BY MOUTH AT BEDTIME AS NEEDED FOR MUSCLE SPASMS   dicyclomine (BENTYL) 20 MG tablet Take 1 tablet (20 mg total) by mouth every 6 (six) hours as needed for spasms.   FIBER PO Take by mouth in the morning and at bedtime.   levothyroxine (SYNTHROID) 150 MCG tablet TAKE 1 TABLET BY MOUTH EVERY DAY BEFORE BREAKFAST   losartan (COZAAR) 50 MG tablet TAKE 1 TABLET BY MOUTH EVERY DAY   magic mouthwash w/lidocaine SOLN Swish and spit 5 mL 4 times daily as needed x 5 days.   MAGNESIUM PO Take by mouth daily.   Multiple Vitamin (MULTIVITAMIN PO) Take by mouth daily.   omeprazole (PRILOSEC) 40 MG capsule TAKE 1 CAPSULE BY MOUTH EVERY DAY 30 TO 60 MINUTES BEFORE DINNER  potassium chloride (KLOR-CON M) 10 MEQ tablet TAKE 1 TABLET BY MOUTH EVERY DAY   PSYLLIUM HUSK PO Take by mouth. Take 3 pills daily   No facility-administered encounter medications on file as of 02/08/2023.    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   Past Surgical History:  Procedure Laterality Date   CARDIAC CATHETERIZATION N/A 02/25/2015   Procedure: Left Heart Cath and Coronary Angiography;  Surgeon: Peter M Swaziland, MD;  Location: [facility] INVASIVE CV LAB;  Service: Cardiovascular;  Laterality: N/A;   CERVICAL DISC SURGERY     CHOLECYSTECTOMY     COLONOSCOPY     DILATION AND CURETTAGE OF UTERUS     HAND SURGERY Right    right hand; calcium deposit on wrist and under thumb and ganglion cyst and carpal tunnel   lumbar radiofrequency nerve ablation     twice yearly   MOUTH SURGERY  2016   couple times due to gum disease   ROTATOR CUFF REPAIR Left 06/07/2015   left shoulder   TOTAL ABDOMINAL HYSTERECTOMY     UPPER GASTROINTESTINAL ENDOSCOPY     Family History  Problem Relation Age of Onset   Celiac disease Mother    Diabetes Mother    Heart disease Mother    Prostate cancer Father    Diabetes Sister    Celiac disease Sister    Diabetes Sister    Breast cancer Maternal Aunt    Colon cancer Neg Hx    Esophageal cancer Neg Hx    Stomach cancer Neg Hx    Rectal cancer Neg Hx    Liver cancer Neg Hx    Pancreatic cancer Neg Hx    Social History   Socioeconomic History   Marital status: Married    Spouse name: Not on file   Number of children: 0   Years of education: Not on file   Highest education level: Not on file  Occupational History   Occupation: Housewife  Tobacco Use   Smoking status: Every Day    Current packs/day: 1.00    Average packs/day: 1 pack/day for 46.0 years (46.0 ttl pk-yrs)    Types: Cigarettes    Passive exposure: Never   Smokeless tobacco: Never  Vaping Use   Vaping status: Never Used  Substance and Sexual Activity   Alcohol use: No    Alcohol/week: 0.0 standard drinks of alcohol   Drug use: No   Sexual activity: Not on file  Other Topics Concern    Not on file  Social History Narrative   Not on file   Social Determinants of Health   Financial Resource Strain: Low Risk  (02/08/2023)   Overall Financial Resource Strain (CARDIA)    Difficulty of Paying Living Expenses: Not hard at all  Food Insecurity: No Food Insecurity (02/08/2023)   Hunger Vital Sign    Worried About Running Out of Food in the Last Year: Never true  Ran Out of Food in the Last Year: Never true  Transportation Needs: No Transportation Needs (02/08/2023)   PRAPARE - Administrator, Civil Service (Medical): No    Lack of Transportation (Non-Medical): No  Physical Activity: Insufficiently Active (02/08/2023)   Exercise Vital Sign    Days of Exercise per Week: 3 days    Minutes of Exercise per Session: 20 min  Stress: No Stress Concern Present (02/08/2023)   [employer] of Occupational Health - Occupational Stress Questionnaire    Feeling of Stress : Not at all  Social Connections: Moderately Isolated (02/08/2023)   Social Connection and Isolation Panel [NHANES]    Frequency of Communication with Friends and Family: More than three times a week    Frequency of Social Gatherings with Friends and Family: More than three times a week    Attends Religious Services: Never    Database administrator or Organizations: No    Attends Engineer, structural: Never    Marital Status: Married    Tobacco Counseling Ready to quit: Yes Counseling given: Yes   Clinical Intake:  Pre-visit preparation completed: Yes  Pain : No/denies pain     BMI - recorded: 28.08 Nutritional Status: BMI 25 -29 Overweight Nutritional Risks: None Diabetes: No  How often do you need to have someone help you when you read instructions, pamphlets, or other written materials from your doctor or pharmacy?: 1 - Never  Interpreter Needed?: No  Information entered by :: Theresa Mulligan LPN   Activities of Daily Living    02/08/2023    1:16 PM  In your present  state of health, do you have any difficulty performing the following activities:  Hearing? 0  Vision? 0  Difficulty concentrating or making decisions? 0  Walking or climbing stairs? 0  Dressing or bathing? 0  Doing errands, shopping? 0  Preparing Food and eating ? N  Using the Toilet? N  In the past six months, have you accidently leaked urine? N  Do you have problems with loss of bowel control? N  Managing your Medications? N  Managing your Finances? N  Housekeeping or managing your Housekeeping? N    Patient Care Team: Kristian Covey, MD as PCP - General Chilton Si, MD as PCP - Cardiology (Cardiology)  Indicate any recent Medical Services you may have received from other than [facility] providers in the past year (date may be approximate).     Assessment:   This is a routine wellness examination for [place].  Hearing/Vision screen Hearing Screening - Comments:: Denies hearing difficulties   Vision Screening - Comments:: Wears rx glasses - up to date with routine eye exams with  [facility]   Goals Addressed               This Visit's Progress     Patient stated (pt-stated)        Cut back on my smoking.       Depression Screen    02/08/2023    1:15 PM 02/03/2022   12:41 PM 01/21/2022    7:03 AM 08/21/2019    7:07 AM 08/08/2018    9:04 AM 06/30/2017   11:51 AM  PHQ 2/9 Scores  PHQ - 2 Score 0 0 0 0 0 0  PHQ- 9 Score     0     Fall Risk    02/08/2023    1:16 PM 02/03/2022   12:43 PM  Fall Risk  Falls in the past year? 0 0  Number falls in past yr: 0 0  Injury with Fall? 0 0  Risk for fall due to : No Fall Risks No Fall Risks  Follow up Falls prevention discussed Falls prevention discussed    MEDICARE RISK AT HOME: Medicare Risk at Home Any stairs in or around the home?: Yes If so, are there any without handrails?: No Home free of loose throw rugs in walkways, pet beds, electrical cords, etc?: Yes Adequate lighting in your home to reduce  risk of falls?: Yes Life alert?: No Use of a cane, walker or w/c?: No Grab bars in the bathroom?: Yes Shower chair or bench in shower?: Yes Elevated toilet seat or a handicapped toilet?: No  TIMED UP AND GO:  Was the test performed?  No    Cognitive Function:        02/08/2023    1:18 PM 02/03/2022   12:44 PM  6CIT Screen  What Year? 0 points 0 points  What month? 0 points 0 points  What time? 0 points 0 points  Count back from 20 0 points 0 points  Months in reverse 0 points 0 points  Repeat phrase 0 points 0 points  Total Score 0 points 0 points    Immunizations Immunization History  Administered Date(s) Administered   PFIZER(Purple Top)SARS-COV-2 Vaccination 01/06/2020, 01/31/2020   Pneumococcal Conjugate-13 07/17/2013   Tdap 06/26/2013   Zoster, Live 07/17/2013    TDAP status: Up to date    Pneumococcal vaccine status: Due, Education has been provided regarding the importance of this vaccine. Advised may receive this vaccine at local pharmacy or Health Dept. Aware to provide a copy of the vaccination record if obtained from local pharmacy or Health Dept. Verbalized acceptance and understanding.  Covid-19 vaccine status: Declined, Education has been provided regarding the importance of this vaccine but patient still declined. Advised may receive this vaccine at local pharmacy or Health Dept.or vaccine clinic. Aware to provide a copy of the vaccination record if obtained from local pharmacy or Health Dept. Verbalized acceptance and understanding.  Qualifies for Shingles Vaccine? Yes   Zostavax completed No   Shingrix Completed?: No.    Education has been provided regarding the importance of this vaccine. Patient has been advised to call insurance company to determine out of pocket expense if they have not yet received this vaccine. Advised may also receive vaccine at local pharmacy or Health Dept. Verbalized acceptance and understanding.  Screening Tests Health  Maintenance  Topic Date Due   Zoster Vaccines- Shingrix (1 of 2) 12/18/1974   Lung Cancer Screening  Never done   Pneumonia Vaccine 78+ Years old (2 of 2 - PPSV23 or PCV20) 09/11/2013   COVID-19 Vaccine (3 - Pfizer risk series) 02/28/2020   Colonoscopy  09/13/2020   DTaP/Tdap/Td (2 - Td or Tdap) 06/27/2023   MAMMOGRAM  12/24/2023   Medicare Annual Wellness (AWV)  02/08/2024   DEXA SCAN  Completed   Hepatitis C Screening  Completed   HPV VACCINES  Aged Out   INFLUENZA VACCINE  Discontinued    Health Maintenance  Health Maintenance Due  Topic Date Due   Zoster Vaccines- Shingrix (1 of 2) 12/18/1974   Lung Cancer Screening  Never done   Pneumonia Vaccine 13+ Years old (2 of 2 - PPSV23 or PCV20) 09/11/2013   COVID-19 Vaccine (3 - Pfizer risk series) 02/28/2020   Colonoscopy  09/13/2020    Colorectal cancer screening: Referral to GI placed  02/08/23. Pt aware the office will call re: appt.  Mammogram status: Ordered 12/01/22. Pt provided with contact info and advised to call to schedule appt.   Bone Density status: Completed 07/19/18. Results reflect: Bone density results: NORMAL. Repeat every   years.  Lung Cancer Screening: (Low Dose CT Chest recommended if Age 56-80 years, 20 pack-year currently smoking OR have quit w/in 15years.) does qualify.   Lung Cancer Screening Referral: Deferred  Additional Screening:  Hepatitis C Screening: does qualify; Completed 06/30/17  Vision Screening: Recommended annual ophthalmology exams for early detection of glaucoma and other disorders of the eye. Is the patient up to date with their annual eye exam?  Yes  Who is the provider or what is the name of the office in which the patient attends annual eye exams? [facility] If pt is not established with a provider, would they like to be referred to a provider to establish care? No .   Dental Screening: Recommended annual dental exams for proper oral hygiene    Community Resource  Referral / Chronic Care Management:  CRR required this visit?  No   CCM required this visit?  No     Plan:     I have personally reviewed and noted the following in the patient's chart:   Medical and social history Use of alcohol, tobacco or illicit drugs  Current medications and supplements including opioid prescriptions. Patient is not currently taking opioid prescriptions. Functional ability and status Nutritional status Physical activity Advanced directives List of other physicians Hospitalizations, surgeries, and ER visits in previous 12 months Vitals Screenings to include cognitive, depression, and falls Referrals and appointments  In addition, I have reviewed and discussed with patient certain preventive protocols, quality metrics, and best practice recommendations. A written personalized care plan for preventive services as well as general preventive health recommendations were provided to patient.     Tillie Rung, LPN   [id number]   After Visit Summary: (MyChart) Due to this being a telephonic visit, the after visit summary with patients personalized plan was offered to patient via MyChart   Nurse Notes: None

## 2023-02-27 ENCOUNTER — Other Ambulatory Visit: Payer: Self-pay | Admitting: Family Medicine

## 2023-02-28 ENCOUNTER — Telehealth: Payer: Self-pay | Admitting: Family Medicine

## 2023-02-28 DIAGNOSIS — M109 Gout, unspecified: Secondary | ICD-10-CM

## 2023-02-28 NOTE — Telephone Encounter (Signed)
Pt called to say that as per a discussion with MD regarding gout, she should come in to have some lab work.  Pt is asking if MD has put in the lab order to check for gout?  I do not see an active request. Please advise, so that I may schedule Pt for labs.

## 2023-03-01 ENCOUNTER — Other Ambulatory Visit: Payer: Self-pay | Admitting: Family Medicine

## 2023-03-02 NOTE — Telephone Encounter (Signed)
Patient informed of the message below and labs placed/

## 2023-03-05 ENCOUNTER — Other Ambulatory Visit: Payer: Self-pay | Admitting: Family Medicine

## 2023-03-08 ENCOUNTER — Encounter: Payer: Medicare Other | Admitting: Family Medicine

## 2023-03-10 ENCOUNTER — Other Ambulatory Visit: Payer: Self-pay | Admitting: Rheumatology

## 2023-03-10 NOTE — Telephone Encounter (Signed)
Last Fill: 12/08/2022  Next Visit: 06/13/2023  Last Visit: 12/09/2022  Dx: DDD (degenerative disc disease), lumbar   Current Dose per office note on 12/09/2022: Flexeril 5 mg p.o. nightly every other night for muscle spasms.   Okay to refill Flexeril?

## 2023-03-13 ENCOUNTER — Telehealth: Payer: Self-pay

## 2023-03-13 ENCOUNTER — Ambulatory Visit (INDEPENDENT_AMBULATORY_CARE_PROVIDER_SITE_OTHER): Payer: Medicare Other | Admitting: Family Medicine

## 2023-03-13 ENCOUNTER — Encounter: Payer: Self-pay | Admitting: Family Medicine

## 2023-03-13 VITALS — BP 112/70 | HR 100 | Temp 98.2°F | Ht 62.5 in | Wt 155.4 lb

## 2023-03-13 DIAGNOSIS — R062 Wheezing: Secondary | ICD-10-CM

## 2023-03-13 DIAGNOSIS — R1084 Generalized abdominal pain: Secondary | ICD-10-CM

## 2023-03-13 DIAGNOSIS — R252 Cramp and spasm: Secondary | ICD-10-CM | POA: Diagnosis not present

## 2023-03-13 MED ORDER — BUDESONIDE-FORMOTEROL FUMARATE 160-4.5 MCG/ACT IN AERO: 2.0000 | INHALATION_SPRAY | Freq: Two times a day (BID) | RESPIRATORY_TRACT | 3 refills | Status: DC

## 2023-03-13 NOTE — Patient Instructions (Signed)
We need to get some labs- and will plan to do so tomorrow at physical.   Rinse mouth with water after use of Symbicort  Symbicort will be two puffs every 12 hours.

## 2023-03-13 NOTE — Telephone Encounter (Signed)
Returned all to patient from VM.  Patient completed sdmv a few years ago but has never kept appt for LDCT.   Today, patient is ready to schedule her first LDCT but states she is sick with deep cough and has not been feeling well, stating "I can barely make it."  She has an appointment with her PCP this afternoon. Advised she should see her PCP, as scheduled, and then call us after her illness has resolved.  Discussed reasons its not a good idea to do a screening scan when symptomatic, as respiratory illnesses can create nodules and result in having to repeat the scan.  Also, screening scans are to be done when not having symptoms.  Patient acknowledged understanding and will call us back to reschedule, after seeking treatment from her PCP.

## 2023-03-13 NOTE — Progress Notes (Signed)
Established Patient Office Visit  Subjective   Patient ID: Kimberly Stein, female    DOB: 03-Jul-1955  Age: 67 y.o. MRN: [id number]  Chief Complaint  Patient presents with   Abdominal Pain    HPI   Kimberly Stein is seen with bilateral upper cramp-like symptoms for the past several days.  She states these are usually triggered by cough, sneeze, or sudden movement.  This feels much like a muscle cramp.  She does have history of some IBS type symptoms but this is different.  She takes Flexeril 5 mg once or twice daily at baseline per rheumatologist but this has not helped.  Denies any fever, nausea, vomiting, or stool changes.  She does take chlorthalidone but is on potassium supplement.  She is scheduled for physical tomorrow and plans to get labs then.  Pain is somewhat migratory.  No epigastric pain.  She has chronic problems including hypertension, celiac disease, GERD, hypothyroidism, Mnire's disease, osteoarthritis involving multiple joints, fibromyalgia.  Also has history of prediabetes range blood sugars.  Due for follow-up labs and she plans to get those tomorrow.  Other issue is she has a longstanding history of smoking over 40 years.  She has almost daily wheezing past few weeks.  Not aware of any environmental triggers.  No recent hemoptysis.  Weight is stable.  No reported fevers.  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   Past Surgical History:  Procedure Laterality Date   CARDIAC CATHETERIZATION N/A 02/25/2015   Procedure: Left Heart Cath and Coronary Angiography;  Surgeon: Peter M  Swazil[place], MD;  Location: [facility] INVASIVE CV LAB;  Service: Cardiovascular;  Laterality: N/A;   CERVICAL DISC SURGERY     CHOLECYSTECTOMY     COLONOSCOPY     DILATION AND CURETTAGE OF UTERUS     HAND SURGERY Right    right hand; calcium deposit on wrist and under thumb and ganglion cyst and carpal tunnel   lumbar radiofrequency nerve ablation     twice yearly   MOUTH SURGERY  2016   couple times due to gum disease   ROTATOR CUFF REPAIR Left 06/07/2015   left shoulder   TOTAL ABDOMINAL HYSTERECTOMY     UPPER GASTROINTESTINAL ENDOSCOPY      reports that she has been smoking cigarettes. She has a 46 pack-year smoking history. She has never been exposed to tobacco smoke. She has never used smokeless tobacco. She reports that she does not drink alcohol and does not use drugs. family history includes Breast cancer in her maternal aunt; Celiac disease in her mother and sister; Diabetes in her mother, sister, and sister; Heart disease in her mother; Prostate cancer in her father. Allergies  Allergen Reactions   Crestor [Rosuvastatin] Other (See Comments)    Bilateral Leg pain   Lipitor [Atorvastatin Calcium]     Muscle aches   Nsaids    Nortriptyline Rash   Nortriptyline Hcl Rash    Red rash   Vicodin [Hydrocodone-Acetaminophen] Itching  Review of Systems  Constitutional:  Negative for chills, fever and weight loss.  Eyes:  Negative for blurred vision.  Respiratory:  Positive for wheezing. Negative for hemoptysis.   Cardiovascular:  Negative for chest pain and leg swelling.  Gastrointestinal:  Positive for abdominal pain. Negative for blood in stool, diarrhea, melena, nausea and vomiting.  Genitourinary:  Negative for dysuria.  Neurological:  Negative for dizziness, weakness and headaches.      Objective:     BP 112/70 (BP Location: Left Arm, Patient Position: Sitting, Cuff Size: Normal)   Pulse 100   Temp 98.2 F (36.8 C) (Oral)   Ht 5' 2.5" (1.588 m)   Wt 155 lb 6.4 oz (70.5  kg)   SpO2 94%   BMI 27.97 kg/m  BP Readings from Last 3 Encounters:  03/13/23 112/70  02/01/23 124/72  12/27/22 (!) 146/78   Wt Readings from Last 3 Encounters:  03/13/23 155 lb 6.4 oz (70.5 kg)  02/08/23 156 lb (70.8 kg)  02/01/23 156 lb 9.6 oz (71 kg)      Physical Exam Constitutional:      Appearance: She is well-developed.  Eyes:     Pupils: Pupils are equal, round, and reactive to light.  Neck:     Thyroid: No thyromegaly.     Vascular: No JVD.  Cardiovascular:     Rate and Rhythm: Normal rate and regular rhythm.     Heart sounds:     No gallop.  Pulmonary:     Effort: Pulmonary effort is normal. No respiratory distress.     Breath sounds: Normal breath sounds. No wheezing or rales.  Abdominal:     Comments: Nondistended.  Normal bowel sounds.  Soft with no localized tenderness.  No hepatomegaly or splenomegaly noted.  No guarding or rebound.  Musculoskeletal:     Cervical back: Neck supple.  Neurological:     Mental Status: She is alert.      No results found for any visits on 03/13/23.    The 10-year ASCVD risk score (Arnett DK, et al., 2019) is: 15.2%    Assessment & Plan:   #1 somewhat migratory upper abdominal pain which sounds to be musculoskeletal.  Triggered by movement.  Sounds almost like she is having more muscle cramps anything.  No fever or other red flags.  Abdominal exam is benign. -Recommend some electrolytes and she plans to get those tomorrow.  She is also having some cramps in her legs and need to check basic electrolytes and add magnesium -Increase hydration -She is already taking magnesium supplement  #2 wheezing.  Probable COPD based on her longstanding smoking history.  We discussed possible trial of oral prednisone but she would like to consider inhaler therapy first.  Initiate Symbicort 160 mg 2 puffs twice daily.  Rinse mouth after use.  Reassess at follow-up  Evelena Peat, MD

## 2023-03-14 ENCOUNTER — Ambulatory Visit: Payer: Medicare Other | Admitting: Family Medicine

## 2023-03-14 ENCOUNTER — Encounter: Payer: Self-pay | Admitting: Family Medicine

## 2023-03-14 ENCOUNTER — Other Ambulatory Visit: Payer: Medicare Other

## 2023-03-14 VITALS — BP 134/80 | HR 110 | Temp 98.0°F | Ht 62.6 in | Wt 154.3 lb

## 2023-03-14 DIAGNOSIS — F17200 Nicotine dependence, unspecified, uncomplicated: Secondary | ICD-10-CM

## 2023-03-14 DIAGNOSIS — M109 Gout, unspecified: Secondary | ICD-10-CM | POA: Diagnosis not present

## 2023-03-14 DIAGNOSIS — R252 Cramp and spasm: Secondary | ICD-10-CM

## 2023-03-14 DIAGNOSIS — R109 Unspecified abdominal pain: Secondary | ICD-10-CM

## 2023-03-14 DIAGNOSIS — E038 Other specified hypothyroidism: Secondary | ICD-10-CM | POA: Diagnosis not present

## 2023-03-14 DIAGNOSIS — I10 Essential (primary) hypertension: Secondary | ICD-10-CM

## 2023-03-14 DIAGNOSIS — R7303 Prediabetes: Secondary | ICD-10-CM | POA: Diagnosis not present

## 2023-03-14 DIAGNOSIS — K900 Celiac disease: Secondary | ICD-10-CM

## 2023-03-14 DIAGNOSIS — E7800 Pure hypercholesterolemia, unspecified: Secondary | ICD-10-CM | POA: Diagnosis not present

## 2023-03-14 DIAGNOSIS — Z136 Encounter for screening for cardiovascular disorders: Secondary | ICD-10-CM

## 2023-03-14 DIAGNOSIS — Z720 Tobacco use: Secondary | ICD-10-CM

## 2023-03-14 LAB — HEPATIC FUNCTION PANEL
ALT: 46 U/L — ABNORMAL HIGH (ref 0–35)
AST: 35 U/L (ref 0–37)
Albumin: 4.8 g/dL (ref 3.5–5.2)
Alkaline Phosphatase: 74 U/L (ref 39–117)
Bilirubin, Direct: 0.1 mg/dL (ref 0.0–0.3)
Total Bilirubin: 0.5 mg/dL (ref 0.2–1.2)
Total Protein: 8.1 g/dL (ref 6.0–8.3)

## 2023-03-14 LAB — CBC WITH DIFFERENTIAL/PLATELET
Basophils Absolute: 0.1 10*3/uL (ref 0.0–0.1)
Basophils Relative: 1.1 % (ref 0.0–3.0)
Eosinophils Absolute: 0.3 10*3/uL (ref 0.0–0.7)
Eosinophils Relative: 3.3 % (ref 0.0–5.0)
HCT: 44.7 % (ref 36.0–46.0)
Hemoglobin: 14.7 g/dL (ref 12.0–15.0)
Lymphocytes Relative: 37.9 % (ref 12.0–46.0)
Lymphs Abs: 3.2 10*3/uL (ref 0.7–4.0)
MCHC: 32.9 g/dL (ref 30.0–36.0)
MCV: 95.9 fL (ref 78.0–100.0)
Monocytes Absolute: 0.4 10*3/uL (ref 0.1–1.0)
Monocytes Relative: 4.8 % (ref 3.0–12.0)
Neutro Abs: 4.5 10*3/uL (ref 1.4–7.7)
Neutrophils Relative %: 52.9 % (ref 43.0–77.0)
Platelets: 207 10*3/uL (ref 150.0–400.0)
RBC: 4.66 Mil/uL (ref 3.87–5.11)
RDW: 14.4 % (ref 11.5–15.5)
WBC: 8.4 10*3/uL (ref 4.0–10.5)

## 2023-03-14 LAB — TSH: TSH: 5.1 u[IU]/mL (ref 0.35–5.50)

## 2023-03-14 LAB — MAGNESIUM: Magnesium: 1.2 mg/dL — ABNORMAL LOW (ref 1.5–2.5)

## 2023-03-14 LAB — LIPID PANEL
Cholesterol: 312 mg/dL — ABNORMAL HIGH (ref 0–200)
HDL: 49.7 mg/dL (ref >39.00)
LDL Cholesterol: 198 mg/dL — ABNORMAL HIGH (ref 0–99)
NonHDL: 262.54
Total CHOL/HDL Ratio: 6
Triglycerides: 323 mg/dL — ABNORMAL HIGH (ref 0.0–149.0)
VLDL: 64.6 mg/dL — ABNORMAL HIGH (ref 0.0–40.0)

## 2023-03-14 LAB — BASIC METABOLIC PANEL
BUN: 19 mg/dL (ref 6–23)
CO2: 28 meq/L (ref 19–32)
Calcium: 10.5 mg/dL (ref 8.4–10.5)
Chloride: 98 meq/L (ref 96–112)
Creatinine, Ser: 0.79 mg/dL (ref 0.40–1.20)
GFR: 77.5 mL/min (ref >60.00)
Glucose, Bld: 117 mg/dL — ABNORMAL HIGH (ref 70–99)
Potassium: 3.9 meq/L (ref 3.5–5.1)
Sodium: 138 meq/L (ref 135–145)

## 2023-03-14 LAB — HEMOGLOBIN A1C: Hgb A1c MFr Bld: 6.2 % (ref 4.6–6.5)

## 2023-03-14 LAB — URIC ACID: Uric Acid, Serum: 4.8 mg/dL (ref 2.4–7.0)

## 2023-03-14 MED ORDER — ALLOPURINOL 300 MG PO TABS: 300.0000 mg | ORAL_TABLET | Freq: Every day | ORAL | 3 refills | Status: DC

## 2023-03-14 MED ORDER — OXYCODONE HCL 5 MG PO TABS: 5.0000 mg | ORAL_TABLET | Freq: Four times a day (QID) | ORAL | 0 refills | Status: DC | PRN

## 2023-03-14 NOTE — Progress Notes (Signed)
Established Patient Office Visit  Subjective   Patient ID: Kimberly Stein, female    DOB: Nov 27, 1955  Age: 67 y.o. MRN: [id number]  Chief Complaint  Patient presents with   Annual Exam    HPI   Kimberly Stein was scheduled for a "complete physical.  However, she has Medicare primary and had Medicare wellness visit already just a month ago.  We converted this to discuss her chronic medical problems.  Chronic problems include:  -Celiac disease, history of GERD, hypothyroidism, Mnire's disease, hypertension, past history of anemia, osteoarthritis involving multiple joints, gout, fibromyalgia, and prediabetes.  She is having some ongoing back pains and intermittent muscle spasms.  Needs follow-up labs.  Ongoing nicotine use.  She has lung cancer screening in process.  Also in process to get repeat mammogram and repeat colonoscopy.  She declines flu vaccine.  Also declines Prevnar 20 but would consider getting at some point later.  We had discussed abdominal aortic aneurysm screening but the fact this is not covered generally in women in the absence of family history of aneurysm.  Medications reviewed.  Her blood pressures been stable on chlorthalidone and losartan.  She takes levothyroxine for hypothyroidism.  On allopurinol 300 mg daily.  No recent gout flares.  Regarding her back pain she is requesting something short-term for pain relief.  She is having fairly severe pain at night.  Not relieved with Tylenol or ibuprofen.  Reported itching with hydrocodone but has tolerated oxycodone in the past.  She had DEXA scan 2020 which showed surprisingly good bone density which was normal.  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   Past Surgical History:  Procedure Laterality Date   CARDIAC CATHETERIZATION N/A 02/25/2015   Procedure: Left Heart Cath and Coronary Angiography;  Surgeon: Peter M Swaziland, MD;  Location: [facility] INVASIVE CV LAB;  Service: Cardiovascular;  Laterality: N/A;   CERVICAL DISC SURGERY     CHOLECYSTECTOMY     COLONOSCOPY     DILATION AND CURETTAGE OF UTERUS     HAND SURGERY Right    right hand; calcium deposit on wrist and under thumb and ganglion cyst and carpal tunnel   lumbar radiofrequency nerve ablation     twice yearly   MOUTH SURGERY  2016   couple times due to gum disease   ROTATOR CUFF REPAIR Left 06/07/2015   left shoulder   TOTAL ABDOMINAL HYSTERECTOMY     UPPER GASTROINTESTINAL ENDOSCOPY      reports that she has been smoking cigarettes. She has a 46 pack-year smoking history. She has never been exposed to tobacco smoke. She has never used smokeless tobacco. She reports that she does not drink alcohol and does not use drugs. family history includes Breast cancer in her maternal aunt; Celiac disease in her mother and sister; Diabetes in her mother, sister, and sister; Heart disease in her mother; Prostate cancer in her father. Allergies  Allergen Reactions   Crestor [Rosuvastatin] Other (See Comments)    Bilateral Leg pain   Lipitor [  Atorvastatin Calcium]     Muscle aches   Nsaids    Nortriptyline Rash   Nortriptyline Hcl Rash    Red rash   Vicodin [Hydrocodone-Acetaminophen] Itching    Review of Systems  Constitutional:  Negative for malaise/fatigue.  Eyes:  Negative for blurred vision.  Respiratory:  Negative for shortness of breath.   Cardiovascular:  Negative for chest pain.  Musculoskeletal:  Positive for back pain and joint pain. Negative for neck pain.  Neurological:  Negative for dizziness, weakness and headaches.      Objective:     BP 134/80 (BP Location: Left Arm,  Patient Position: Sitting, Cuff Size: Normal)   Pulse (!) 110   Temp 98 F (36.7 C) (Oral)   Ht 5' 2.6" (1.59 m)   Wt 154 lb 4.8 oz (70 kg)   SpO2 98%   BMI 27.69 kg/m  BP Readings from Last 3 Encounters:  03/14/23 134/80  03/13/23 112/70  02/01/23 124/72   Wt Readings from Last 3 Encounters:  03/14/23 154 lb 4.8 oz (70 kg)  03/13/23 155 lb 6.4 oz (70.5 kg)  02/08/23 156 lb (70.8 kg)      Physical Exam Constitutional:      General: She is not in acute distress.    Appearance: She is well-developed. She is not ill-appearing.  Eyes:     Pupils: Pupils are equal, round, and reactive to light.  Neck:     Thyroid: No thyromegaly.     Vascular: No JVD.  Cardiovascular:     Rate and Rhythm: Normal rate and regular rhythm.     Heart sounds:     No gallop.  Pulmonary:     Effort: Pulmonary effort is normal. No respiratory distress.     Breath sounds: Normal breath sounds. No wheezing or rales.  Abdominal:     Palpations: Abdomen is soft.     Tenderness: There is no abdominal tenderness.  Musculoskeletal:     Cervical back: Neck supple.     Right lower leg: No edema.     Left lower leg: No edema.     Comments: No spinal tenderness thoracic or lumbar region.  Neurological:     Mental Status: She is alert.      No results found for any visits on 03/14/23.    The 10-year ASCVD risk score (Arnett DK, et al., 2019) is: 21.1%    Assessment & Plan:   Problem List Items Addressed This Visit       Unprioritized   Gout   Relevant Orders   Uric Acid   Hyperlipidemia   Relevant Orders   Lipid panel   Hepatic function panel   Essential hypertension   Relevant Orders   Basic metabolic panel   Hypothyroidism - Primary   Relevant Orders   TSH   CELIAC SPRUE   Relevant Orders   CBC with Differential/Platelet   Other Visit Diagnoses     Prediabetes       Relevant Orders   Hemoglobin A1c   Bilateral leg cramps       Relevant Orders   Magnesium   Nicotine  abuse       Encounter for screening for cardiovascular disorders         Discussed several health maintenance items as follows: -Recommend flu vaccine but she declines -Recommend Prevnar 20.  She declines but will consider later -Setting up repeat mammogram -Order placed already for lung cancer screening and she is in process of setting  up -She has scheduled GI follow-up for repeat colonoscopy -Consider DEXA scan within the next year to  -Check multiple labs as above.  Adding magnesium level with her frequent muscle cramps.  -We agreed to limited oxycodone No. 15 1 every 6 hours as needed for severe pain.  Avoid regular use.  -She is currently taking allopurinol 100 mg 3 daily.  For simplicity, will change to allopurinol 300 mg once daily  No follow-ups on file.    Evelena Peat, MD

## 2023-03-14 NOTE — Patient Instructions (Signed)
Consider Prevnar 20 (pneumonia vaccine) at some point this year.  Go for labs today at [address].

## 2023-03-19 NOTE — Addendum Note (Signed)
Addended by: Kristian Covey on: 03/19/2023 10:56 AM   Modules accepted: Orders

## 2023-03-22 ENCOUNTER — Encounter: Payer: Self-pay | Admitting: Gastroenterology

## 2023-03-22 ENCOUNTER — Other Ambulatory Visit: Payer: Medicare Other

## 2023-03-22 ENCOUNTER — Ambulatory Visit (INDEPENDENT_AMBULATORY_CARE_PROVIDER_SITE_OTHER): Payer: Medicare Other | Admitting: Gastroenterology

## 2023-03-22 VITALS — BP 122/80 | HR 88 | Ht 63.0 in | Wt 159.0 lb

## 2023-03-22 DIAGNOSIS — Z860101 Personal history of adenomatous and serrated colon polyps: Secondary | ICD-10-CM | POA: Diagnosis not present

## 2023-03-22 DIAGNOSIS — R1012 Left upper quadrant pain: Secondary | ICD-10-CM

## 2023-03-22 DIAGNOSIS — K900 Celiac disease: Secondary | ICD-10-CM

## 2023-03-22 MED ORDER — NA SULFATE-K SULFATE-MG SULF 17.5-3.13-1.6 GM/177ML PO SOLN: 1.0000 | Freq: Once | ORAL | 0 refills | Status: AC

## 2023-03-22 NOTE — Patient Instructions (Addendum)
_______________________________________________________  If your blood pressure at your visit was 140/90 or greater, please contact your primary care physician to follow up on this.  _______________________________________________________  If you are age 67 or older, your body mass index should be between 23-30. Your Body mass index is 28.17 kg/m. If this is out of the aforementioned range listed, please consider follow up with your Primary Care Provider.  If you are age 50 or younger, your body mass index should be between 19-25. Your Body mass index is 28.17 kg/m. If this is out of the aformentioned range listed, please consider follow up with your Primary Care Provider.   ________________________________________________________  The [facility] providers would like to encourage you to use [facility] to communicate with providers for non-urgent requests or questions.  Due to long hold times on the telephone, sending your provider a message by [facility] may be a faster and more efficient way to get a response.  Please allow 48 business hours for a response.  Please remember that this is for non-urgent requests.  _______________________________________________________  Your provider has requested that you go to the basement level for lab work before leaving today. Press "B" on the elevator. The lab is located at the first door on the left as you exit the elevator.  You have been scheduled for a colonoscopy. Please follow written instructions given to you at your visit today.   Please pick up your prep supplies at the pharmacy within the next 1-3 days.  If you use inhalers (even only as needed), please bring them with you on the day of your procedure.  DO NOT TAKE 7 DAYS PRIOR TO TEST- Trulicity (dulaglutide) Ozempic, Wegovy (semaglutide) Mounjaro (tirzepatide) Bydureon Bcise (exanatide extended release)  DO NOT TAKE 1 DAY PRIOR TO YOUR TEST Rybelsus (semaglutide) Adlyxin  (lixisenatide) Victoza (liraglutide) Byetta (exanatide) _______________________________________________________________________  You have been scheduled for a CT scan of the abdomen and pelvis at [facility], 1st floor Radiology. You are scheduled on 03-28-23 at 5:30pm. Please arrive at 3:30pm.  1) Do not eat anything after 1:30pm (4 hours prior to your test)    You may take any medications as prescribed with a small amount of water, if necessary. If you take any of the following medications: METFORMIN, GLUCOPHAGE, GLUCOVANCE, AVANDAMET, RIOMET, FORTAMET, ACTOPLUS MET, JANUMET, GLUMETZA or METAGLIP, you MAY be asked to HOLD this medication 48 hours AFTER the exam.   The purpose of you drinking the oral contrast is to aid in the visualization of your intestinal tract. The contrast solution may cause some diarrhea. Depending on your individual set of symptoms, you may also receive an intravenous injection of x-ray contrast/dye. Plan on being at [facility] for 45 minutes or longer, depending on the type of exam you are having performed.   If you have any questions regarding your exam or if you need to reschedule, you may call [facility] at [phone] between the hours of 8:00 am and 5:00 pm, Monday-Friday.   Due to recent changes in healthcare laws, you may see the results of your imaging and laboratory studies on MyChart before your provider has had a chance to review them.  We understand that in some cases there may be results that are confusing or concerning to you. Not all laboratory results come back in the same time frame and the provider may be waiting for multiple results in order to interpret others.  Please give [place] 48 hours in order for your provider to thoroughly  review all the results before contacting the office for clarification of your results.

## 2023-03-22 NOTE — Progress Notes (Signed)
Assessment    Left upper quadrant abdominal pain/fullness Intermittent, severe, brief episodes of abdominal cramping consistent with abdominal wall muscle spasms/cramps Celiac disease Personal history of adenomatous colon polyps overdue for colonoscopy   Recommendations   Schedule CT AP for LUQ pain tTG, IgA today Schedule colonoscopy for polyp surveillance.  The risks (including bleeding, perforation, infection, missed lesions, medication reactions and possible hospitalization or surgery if complications occur), benefits, and alternatives to colonoscopy with possible biopsy and possible polypectomy were discussed with the patient and they consent to proceed.     HPI   Chief complaint: LUQ abdominal pain/fullness, intermittent abdominal cramping  Patient profile:  Kimberly Stein is a 67 y.o. female referred by Kristian Covey, MD with LUQ pain, abdominal cramping, celiac disease and a personal history of adenomatous colon polyps.  She relates fullness and discomfort in her left upper quadrant unchanged by meals or bowel movements.  She has had intermittent, brief, severe episodes of abdominal cramping generally associated with sneezing or coughing.  She has had constipation in the past but recently no difficulties with constipation.  She is overdue for surveillance colonoscopy and celiac disease monitoring.  She relates she relates she remains on a strict gluten-free diet. Denies weight loss, diarrhea, change in stool caliber, melena, hematochezia, nausea, vomiting, dysphagia, reflux symptoms, chest pain.    Previous Labs / Imaging::    Latest Ref Rng & Units 03/14/2023    9:44 AM 01/21/2022    7:34 AM 10/16/2020    8:01 AM  CBC  WBC 4.0 - 10.5 K/uL 8.4  7.7  6.7   Hemoglobin 12.0 - 15.0 g/dL 01.6  01.0  93.2   Hematocrit 36.0 - 46.0 % 44.7  42.3  38.9   Platelets 150.0 - 400.0 K/uL 207.0  165.0  184.0     No results found for: "LIPASE"    Latest Ref Rng & Units  03/14/2023    9:44 AM 01/21/2022    7:34 AM 10/16/2020    8:01 AM  CMP  Glucose 70 - 99 mg/dL 355  732  202   BUN 6 - 23 mg/dL 19  18  18    Creatinine 0.40 - 1.20 mg/dL 5.42  7.06  2.37   Sodium 135 - 145 mEq/L 138  139  138   Potassium 3.5 - 5.1 mEq/L 3.9  4.3  4.4   Chloride 96 - 112 mEq/L 98  99  100   CO2 19 - 32 mEq/L 28  31  30    Calcium 8.4 - 10.5 mg/dL 62.8  31.5  17.6   Total Protein 6.0 - 8.3 g/dL 8.1  8.2  7.4   Total Bilirubin 0.2 - 1.2 mg/dL 0.5  0.3  0.4   Alkaline Phos 39 - 117 U/L 74  72  37   AST 0 - 37 U/L 35  23  25   ALT 0 - 35 U/L 46  21  22      Previous GI evaluation    Endoscopies:  Colonoscopy Apr 2019 - One 7 mm polyp at the hepatic flexure, removed with a cold snare. Resected and retrieved.  - Two 3 to 5 mm polyps in the transverse colon, removed with a cold biopsy forceps. Resected and retrieved.  - Internal hemorrhoids.  - The examination was otherwise normal on direct and retroflexion views. 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   Past Surgical History:  Procedure Laterality Date   CARDIAC CATHETERIZATION N/A 02/25/2015   Procedure: Left Heart Cath and Coronary Angiography;  Surgeon: Peter M Swaziland, MD;  Location: [facility] INVASIVE CV LAB;  Service: Cardiovascular;  Laterality: N/A;   CERVICAL DISC SURGERY     CHOLECYSTECTOMY     COLONOSCOPY     DILATION AND CURETTAGE OF UTERUS     HAND SURGERY Right    right hand; calcium deposit on wrist and under thumb and ganglion cyst and carpal tunnel   lumbar radiofrequency nerve ablation     twice yearly   MOUTH SURGERY  2016    couple times due to gum disease   ROTATOR CUFF REPAIR Left 06/07/2015   left shoulder   TOTAL ABDOMINAL HYSTERECTOMY     UPPER GASTROINTESTINAL ENDOSCOPY     Family History  Problem Relation Age of Onset   Celiac disease Mother    Diabetes Mother    Heart disease Mother    Prostate cancer Father    Diabetes Sister    Celiac disease Sister    Diabetes Sister    Breast cancer Maternal Aunt    Colon cancer Neg Hx    Esophageal cancer Neg Hx    Stomach cancer Neg Hx    Rectal cancer Neg Hx    Liver cancer Neg Hx    Pancreatic cancer Neg Hx    Social History   Tobacco Use   Smoking status: Every Day    Current packs/day: 1.00    Average packs/day: 1 pack/day for 46.0 years (46.0 ttl pk-yrs)    Types: Cigarettes    Passive exposure: Never   Smokeless tobacco: Never  Vaping Use   Vaping status: Never Used  Substance Use Topics   Alcohol use: No    Alcohol/week: 0.0 standard drinks of alcohol   Drug use: No   Current Outpatient Medications  Medication Sig Dispense Refill   albuterol (VENTOLIN HFA) 108 (90 Base) MCG/ACT inhaler Inhale 2 puffs into the lungs every 6 (six) hours as needed for wheezing or shortness of breath. 18 g 2   allopurinol (ZYLOPRIM) 300 MG tablet Take 1 tablet (300 mg total) by mouth daily. 90 tablet 3   budesonide-formoterol (SYMBICORT) 160-4.5 MCG/ACT inhaler Inhale 2 puffs into the lungs 2 (two) times daily. 1 each 3   Calcium Carb-Cholecalciferol (CALCIUM 600 + D PO) Take by mouth 2 (two) times daily.     chlorthalidone (HYGROTON) 25 MG tablet TAKE 1 TABLET(25 MG) BY MOUTH DAILY 30 tablet 0   colchicine 0.6 MG tablet Take 1 tablet (0.6 mg total) by mouth 2 (two) times daily. 20 tablet 0   colchicine 0.6 MG tablet Take 1 tablet (0.6 mg total) by mouth daily. 30 tablet 1   cyclobenzaprine (FLEXERIL) 5 MG tablet TAKE 1 TABLET(5 MG) BY MOUTH AT BEDTIME AS NEEDED FOR MUSCLE SPASMS 30 tablet 2   dicyclomine (BENTYL) 20 MG tablet Take 1 tablet (20 mg total)  by mouth every 6 (six) hours as needed for spasms. 30 tablet 0  FIBER PO Take by mouth in the morning and at bedtime.     levothyroxine (SYNTHROID) 150 MCG tablet TAKE 1 TABLET BY MOUTH EVERY DAY BEFORE BREAKFAST 30 tablet 0   losartan (COZAAR) 50 MG tablet TAKE 1 TABLET BY MOUTH EVERY DAY 90 tablet 0   magic mouthwash w/lidocaine SOLN Swish and spit 5 mL 4 times daily as needed x 5 days. 200 mL 0   MAGNESIUM PO Take by mouth daily.     Multiple Vitamin (MULTIVITAMIN PO) Take by mouth daily.     omeprazole (PRILOSEC) 40 MG capsule TAKE 1 CAPSULE BY MOUTH EVERY DAY 30 TO 60 MINUTES BEFORE DINNER 90 capsule 1   oxyCODONE (ROXICODONE) 5 MG immediate release tablet Take 1 tablet (5 mg total) by mouth every 6 (six) hours as needed for severe pain (pain score 7-10). 15 tablet 0   potassium chloride (KLOR-CON M) 10 MEQ tablet TAKE 1 TABLET BY MOUTH EVERY DAY 90 tablet 3   PSYLLIUM HUSK PO Take by mouth. Take 3 pills daily     No current facility-administered medications for this visit.   Allergies  Allergen Reactions   Crestor [Rosuvastatin] Other (See Comments)    Bilateral Leg pain   Lipitor [Atorvastatin Calcium]     Muscle aches   Nsaids    Nortriptyline Rash   Nortriptyline Hcl Rash    Red rash   Vicodin [Hydrocodone-Acetaminophen] Itching    Review of Systems: All other systems reviewed and negative except where noted in HPI.    Physical Exam    Wt Readings from Last 3 Encounters:  03/14/23 154 lb 4.8 oz (70 kg)  03/13/23 155 lb 6.4 oz (70.5 kg)  02/08/23 156 lb (70.8 kg)    There were no vitals taken for this visit. Constitutional:  Generally well appearing female in no acute distress. HEENT: Pupils normal.  Conjunctivae are normal. No scleral icterus. No oral lesions or deformities noted.  Neck: Supple.  Cardiac: Normal rate, regular rhythm without murmurs. Pulmonary/chest: Effort normal and breath sounds normal. No wheezing, rales or rhonchi. Abdominal: Soft,  nondistended, nontender. Active bowel sounds. No palpable HSM, masses or hernias. Rectal: Deferred to colonoscopy  Extremities: No edema or deformities noted Neurological: Alert and oriented to person, place and time. Psychiatric: Pleasant. Normal mood and affect. Behavior is normal. Skin: Skin is warm and dry. No rashes noted.  Claudette Head, MD   cc:  Referring Provider Kristian Covey, MD

## 2023-03-23 LAB — TISSUE TRANSGLUTAMINASE, IGA: (tTG) Ab, IgA: <1 U/mL

## 2023-03-23 LAB — IGA: Immunoglobulin A: 181 mg/dL (ref 70–320)

## 2023-03-28 ENCOUNTER — Ambulatory Visit (HOSPITAL_COMMUNITY): Payer: Medicare Other

## 2023-03-31 ENCOUNTER — Other Ambulatory Visit: Payer: Self-pay | Admitting: Family Medicine

## 2023-04-05 ENCOUNTER — Other Ambulatory Visit: Payer: Self-pay | Admitting: Family Medicine

## 2023-04-10 ENCOUNTER — Encounter: Payer: Medicare Other | Admitting: Gastroenterology

## 2023-04-10 ENCOUNTER — Telehealth: Payer: Self-pay | Admitting: Family Medicine

## 2023-04-10 NOTE — Telephone Encounter (Signed)
Patient informed of the message below and voiced understanding  

## 2023-04-10 NOTE — Telephone Encounter (Signed)
Requesting cpt code for abdominal aortic embolism test. Needs it for an estimate, insurance won't cover for a female. Says procedure will take place Wednesday at [facility]

## 2023-04-12 ENCOUNTER — Encounter (HOSPITAL_BASED_OUTPATIENT_CLINIC_OR_DEPARTMENT_OTHER): Payer: Self-pay

## 2023-04-12 ENCOUNTER — Ambulatory Visit (HOSPITAL_BASED_OUTPATIENT_CLINIC_OR_DEPARTMENT_OTHER)
Admission: RE | Admit: 2023-04-12 | Discharge: 2023-04-12 | Disposition: A | Discharge status: Home / Self Care | Payer: Medicare Other | Source: Ambulatory Visit | Attending: Family Medicine | Admitting: Family Medicine

## 2023-04-12 DIAGNOSIS — R109 Unspecified abdominal pain: Secondary | ICD-10-CM

## 2023-04-12 DIAGNOSIS — F17200 Nicotine dependence, unspecified, uncomplicated: Secondary | ICD-10-CM

## 2023-04-13 ENCOUNTER — Ambulatory Visit (HOSPITAL_BASED_OUTPATIENT_CLINIC_OR_DEPARTMENT_OTHER)
Admission: RE | Admit: 2023-04-13 | Discharge: 2023-04-13 | Disposition: A | Discharge status: Home / Self Care | Payer: Medicare Other | Source: Ambulatory Visit | Attending: Family Medicine | Admitting: Family Medicine

## 2023-04-13 DIAGNOSIS — R109 Unspecified abdominal pain: Secondary | ICD-10-CM | POA: Insufficient documentation

## 2023-04-13 DIAGNOSIS — F17200 Nicotine dependence, unspecified, uncomplicated: Secondary | ICD-10-CM | POA: Diagnosis present

## 2023-04-14 ENCOUNTER — Other Ambulatory Visit: Payer: Self-pay | Admitting: Family Medicine

## 2023-04-30 ENCOUNTER — Other Ambulatory Visit: Payer: Self-pay | Admitting: Family Medicine

## 2023-05-04 ENCOUNTER — Ambulatory Visit (HOSPITAL_COMMUNITY): Payer: Medicare Other

## 2023-05-08 ENCOUNTER — Telehealth: Payer: Self-pay

## 2023-05-08 NOTE — Telephone Encounter (Signed)
The pt states that she is unable to lie flat for any testing. She says that "her insides twist" and her body cramps up. She did speak with CT to get some advise but states she is just unable to proceed.   She will not be rescheduling the CT scan.  Please advise

## 2023-05-08 NOTE — Telephone Encounter (Signed)
Freet,Hagen,dob 09-22-1955 was scheduled for ct 05/04/23,ct cancelled, pt too sick to come. [place] ct  7:39 AM You were added by Meryl Dare, MD. 9:26 AM MS Meryl Dare, MD Nubia Ziesmer, please contact Ms Titus to find out how she's doing and try to reschedule her CT AP. Thanks

## 2023-05-09 ENCOUNTER — Other Ambulatory Visit: Payer: Self-pay

## 2023-05-09 DIAGNOSIS — R109 Unspecified abdominal pain: Secondary | ICD-10-CM

## 2023-05-09 NOTE — Telephone Encounter (Signed)
The pt agrees to try [place]- order entered and sent to the schedulers.  She is following with PCP in regards to the inability to lie flat.

## 2023-05-22 ENCOUNTER — Ambulatory Visit (HOSPITAL_COMMUNITY)
Admission: RE | Admit: 2023-05-22 | Discharge: 2023-05-22 | Disposition: A | Discharge status: Home / Self Care | Payer: Medicare Other | Source: Ambulatory Visit | Attending: Gastroenterology | Admitting: Gastroenterology

## 2023-05-22 DIAGNOSIS — R109 Unspecified abdominal pain: Secondary | ICD-10-CM | POA: Diagnosis present

## 2023-05-29 ENCOUNTER — Other Ambulatory Visit: Payer: Self-pay

## 2023-05-29 DIAGNOSIS — K760 Fatty (change of) liver, not elsewhere classified: Secondary | ICD-10-CM

## 2023-05-29 DIAGNOSIS — N289 Disorder of kidney and ureter, unspecified: Secondary | ICD-10-CM

## 2023-05-29 DIAGNOSIS — K838 Other specified diseases of biliary tract: Secondary | ICD-10-CM

## 2023-05-30 NOTE — Progress Notes (Deleted)
 Office Visit Note  Patient: Kimberly Stein             Date of Birth: 01/22/1956           MRN: [id number]             PCP: Kristian Covey, MD Referring: Kristian Covey, MD Visit Date: 06/13/2023 Occupation: @GUAROCC @  Subjective:  No chief complaint on file.   History of Present Illness: Kimberly Stein is a 68 y.o. female ***     Activities of Daily Living:  Patient reports morning stiffness for *** {minute/hour:19697}.   Patient {ACTIONS;DENIES/REPORTS:21021675::"Denies"} nocturnal pain.  Difficulty dressing/grooming: {ACTIONS;DENIES/REPORTS:21021675::"Denies"} Difficulty climbing stairs: {ACTIONS;DENIES/REPORTS:21021675::"Denies"} Difficulty getting out of chair: {ACTIONS;DENIES/REPORTS:21021675::"Denies"} Difficulty using hands for taps, buttons, cutlery, and/or writing: {ACTIONS;DENIES/REPORTS:21021675::"Denies"}  No Rheumatology ROS completed.   PMFS History:  Patient Active Problem List   Diagnosis Date Noted   Gout 02/01/2023   Pain in right shoulder 10/29/2020   Low back pain 11/26/2019   Impingement syndrome of right shoulder 11/26/2019   Belching 04/17/2019   Screening for viral disease 04/17/2019   Other fatigue 06/13/2016   Primary insomnia 06/13/2016   Primary osteoarthritis of both hands 06/13/2016   Trochanteric bursitis of both hips 06/13/2016   DDD cervical spine status post fusion 06/13/2016   Facet arthritis, degenerative, lumbar spine 06/13/2016   Tendinopathy of right rotator cuff 06/13/2016   Chest pain, atypical 02/25/2015   Abnormal nuclear stress test 02/25/2015   Hyperlipidemia 07/17/2013   Osteopenia 01/28/2012   Opioid type dependence (HCC) 03/09/2010   CELIAC SPRUE 09/29/2009   Hypothyroidism 09/07/2009   Meniere's disease 09/07/2009   Essential hypertension 09/07/2009   RUQ PAIN 09/07/2009   Fibromyalgia 06/12/2009   CERVICALGIA 06/03/2009   ANEMIA-IRON DEFICIENCY 10/21/2008   Gastroesophageal reflux disease  10/21/2008   DIZZINESS 10/21/2008   History of colonic polyps 10/21/2008    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    Family History  Problem Relation Age of Onset   Celiac disease Mother    Diabetes Mother    Heart disease Mother    Prostate cancer Father    Diabetes Sister    Celiac disease Sister    Diabetes Sister    Breast cancer Maternal Aunt    Colon cancer Neg Hx    Esophageal cancer Neg Hx    Liver cancer Neg Hx    Past Surgical History:  Procedure Laterality Date   CARDIAC CATHETERIZATION N/A 02/25/2015   Procedure: Left Heart Cath and Coronary Angiography;  Surgeon: Peter M Swaziland, MD;  Location: [facility] INVASIVE CV LAB;  Service: Cardiovascular;  Laterality: N/A;   CERVICAL DISC SURGERY     CHOLECYSTECTOMY     COLONOSCOPY     DILATION AND CURETTAGE OF UTERUS     HAND SURGERY Right    right hand; calcium deposit on wrist and under thumb and ganglion cyst and carpal tunnel   lumbar radiofrequency nerve ablation     twice yearly   MOUTH SURGERY  2016   couple  times due to gum disease   ROTATOR CUFF REPAIR Left 06/07/2015   left shoulder   TOTAL ABDOMINAL HYSTERECTOMY     UPPER GASTROINTESTINAL ENDOSCOPY     Social History   Social History Narrative   Not on file   Immunization History  Administered Date(s) Administered   PFIZER(Purple Top)SARS-COV-2 Vaccination 01/06/2020, 01/31/2020   Pneumococcal Conjugate-13 07/17/2013   Tdap 06/26/2013   Zoster, Live 07/17/2013     Objective: Vital Signs: There were no vitals taken for this visit.   Physical Exam   Musculoskeletal Exam: ***  CDAI Exam: CDAI Score:  -- Patient Global: --; Provider Global: -- Swollen: --; Tender: -- Joint Exam 06/13/2023   No joint exam has been documented for this visit   There is currently no information documented on the homunculus. Go to the Rheumatology activity and complete the homunculus joint exam.  Investigation: No additional findings.  Imaging: US Abdomen Complete Result Date: 05/23/2023 : PROCEDURE: ULTRASOUND ABDOMEN COMPLETE HISTORY: Patient is a 68 y/o Female with abd pain. Cholecystectomy. COMPARISON: U/S aorta 04/12/2023 TECHNIQUE: Two-dimensional grayscale and color Doppler ultrasound of the abdomen was performed. FINDINGS: The pancreas is obscured due to overlying bowel gas. The liver demonstrates increased echotexture without intrahepatic biliary dilatation. No masses are visualized. The main portal vein demonstrates normal hepatopedal flow. The gallbladder is surgically absent. The common bile duct is dilated measuring 0.9 cm, often seen after cholecystectomy. The right kidney measures 11.7 cm in length. Renal cortical echotexture is normal. There is no hydronephrosis. There are no stones. There are no cysts. The left kidney measures 11.6 cm in length. Renal cortical echotexture is normal. There is a 3.0 x 2.7 cm isoechoic, ovoid area visualized anterior mid cortex possibly representing a dromedary hump however, mass can not be entirely excluded. There is no hydronephrosis. There are no stones. There are no cysts. The spleen measures 10.0 cm in length and demonstrates normal echotexture. There is no evidence of aneurysm within the visualized segments of the abdominal aorta. The visualized segments of the IVC are unremarkable. IMPRESSION: 1. Increased hepatic echotexture, most commonly seen with steatosis. Correlation with LFT's is recommended. 2. Surgically absent gallbladder with dilatation of the common bile duct, often seen after cholecystectomy. 3. Left renal anterior mid cortex ovoid area, possibly  representing a dromedary hump however mass can not be entirely excluded. Multiphase CT of the abdomen is recommended for further evaluation. Thank you for allowing [place] to assist in the care of this patient. Electronically Signed   By: Lestine Box M.D.   On: 05/23/2023 07:20    Recent Labs: Lab Results  Component Value Date   WBC 8.4 03/14/2023   HGB 14.7 03/14/2023   PLT 207.0 03/14/2023   NA 138 03/14/2023   K 3.9 03/14/2023   CL 98 03/14/2023   CO2 28 03/14/2023   GLUCOSE 117 (H) 03/14/2023   BUN 19 03/14/2023   CREATININE 0.79 03/14/2023   BILITOT 0.5 03/14/2023   ALKPHOS 74 03/14/2023   AST 35 03/14/2023   ALT 46 (H) 03/14/2023   PROT 8.1 03/14/2023   ALBUMIN 4.8 03/14/2023   CALCIUM 10.5 03/14/2023   GFRAA 85 12/12/2016    Speciality Comments: No specialty comments available.  Procedures:  No procedures performed Allergies: Crestor [rosuvastatin], Lipitor [atorvastatin calcium], Nsaids, Nortriptyline, Nortriptyline hcl, and Vicodin [hydrocodone-acetaminophen]   Assessment / Plan:     Visit Diagnoses: Fibromyalgia  Trochanteric bursitis of both hips  Primary osteoarthritis of both hands  Tendinopathy of right  rotator cuff  DDD (degenerative disc disease), cervical  Degeneration of intervertebral disc of lumbar region without discogenic back pain or lower extremity pain  Other fatigue  Primary insomnia  Osteopenia of multiple sites  History of hyperlipidemia  History of anemia  History of Meniere's disease  History of hypertension  History of gastroesophageal reflux (GERD)  History of hypothyroidism  Orders: No orders of the defined types were placed in this encounter.  No orders of the defined types were placed in this encounter.   Face-to-face time spent with patient was *** minutes. Greater than 50% of time was spent in counseling and coordination of care.  Follow-Up Instructions: No follow-ups on file.   Gearldine Bienenstock, PA-C  Note -  This record has been created using Dragon software.  Chart creation errors have been sought, but may not always  have been located. Such creation errors do not reflect on  the standard of medical care.

## 2023-06-01 ENCOUNTER — Other Ambulatory Visit: Payer: Self-pay | Admitting: Family Medicine

## 2023-06-05 ENCOUNTER — Telehealth: Payer: Self-pay | Admitting: Family Medicine

## 2023-06-05 NOTE — Telephone Encounter (Signed)
 Patient has been scheduled for 06/07/2023

## 2023-06-05 NOTE — Telephone Encounter (Signed)
 I spoke with the patient and she reported she had Ultrasound done by Dr. Aneita 05/22/2023. Patient has upcoming CT scan on 05/30/2023 to further assess. Patient reported she has d/c Allopurinol  and Colchicine  due to abdominal pain, Patient reported no abdominal pain in 3 days since discontinuing. Patient inquired in what she should do moving forward and should she continue to take Allopurinol / Colchicine ?

## 2023-06-05 NOTE — Telephone Encounter (Signed)
 Copied from CRM [id number]. Topic: General - Other >> Jun 05, 2023 11:04 AM Pascal Lux wrote: Reason for CRM: Requesting a call back from Dr. Caryl Never nurse Casimiro Needle urgently regarding medical issues: diagnosed with liver disease and lesion on left kidney.

## 2023-06-07 ENCOUNTER — Ambulatory Visit (INDEPENDENT_AMBULATORY_CARE_PROVIDER_SITE_OTHER): Payer: Medicare Other | Admitting: Family Medicine

## 2023-06-07 ENCOUNTER — Encounter: Payer: Self-pay | Admitting: Family Medicine

## 2023-06-07 VITALS — BP 146/80 | HR 78 | Temp 97.7°F | Wt 163.3 lb

## 2023-06-07 DIAGNOSIS — K760 Fatty (change of) liver, not elsewhere classified: Secondary | ICD-10-CM

## 2023-06-07 DIAGNOSIS — R7401 Elevation of levels of liver transaminase levels: Secondary | ICD-10-CM | POA: Diagnosis not present

## 2023-06-07 DIAGNOSIS — M109 Gout, unspecified: Secondary | ICD-10-CM | POA: Diagnosis not present

## 2023-06-07 DIAGNOSIS — E7800 Pure hypercholesterolemia, unspecified: Secondary | ICD-10-CM

## 2023-06-07 DIAGNOSIS — R7303 Prediabetes: Secondary | ICD-10-CM | POA: Insufficient documentation

## 2023-06-07 NOTE — Progress Notes (Signed)
 Established Patient Office Visit  Subjective   Patient ID: Kimberly Stein, female    DOB: 05-05-1956  Age: 68 y.o. MRN: [id number]  Chief Complaint  Patient presents with   Medication Consultation    HPI   Andilyn has history of hypertension, celiac disease, GERD, hypothyroidism, osteoarthritis, gout, hyperlipidemia.  She has had several years really of somewhat migratory bilateral abdominal pain mostly upper abdomen.  She saw GI recently and had ultrasound which showed fatty liver changes.  There is also nondescript left renal mass which was felt to likely be benign.  She has pending CT with contrast scheduled.  Also has ordered comprehensive metabolic panel.  She had recent labs here in the fall with mildly elevated ALT of 46.  She also has prediabetes with recent A1c 6.2%.  She knows she needs to lose some weight.  Currently not walking or exercising much.  Recent poor dietary compliance.  Very high cholesterol with recent total cholesterol 312 with LDL 198.  Previous intolerance to statins.  She had concerns whether allopurinol  may be causing her abdominal pain.  Has been on this for several years.  No recent gout flareups.  She actually stopped her allopurinol  about 4 days ago.  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   Past Surgical History:  Procedure Laterality Date   CARDIAC CATHETERIZATION N/A 02/25/2015   Procedure: Left Heart Cath and Coronary Angiography;  Surgeon: Peter M Swaziland, MD;  Location: [facility] INVASIVE CV LAB;  Service:  Cardiovascular;  Laterality: N/A;   CERVICAL DISC SURGERY     CHOLECYSTECTOMY     COLONOSCOPY     DILATION AND CURETTAGE OF UTERUS     HAND SURGERY Right    right hand; calcium  deposit on wrist and under thumb and ganglion cyst and carpal tunnel   lumbar radiofrequency nerve ablation     twice yearly   MOUTH SURGERY  2016   couple times due to gum disease   ROTATOR CUFF REPAIR Left 06/07/2015   left shoulder   TOTAL ABDOMINAL HYSTERECTOMY     UPPER GASTROINTESTINAL ENDOSCOPY      reports that she has been smoking cigarettes. She has a 46 pack-year smoking history. She has never been exposed to tobacco smoke. She has never used smokeless tobacco. She reports that she does not drink alcohol and does not use drugs. family history includes Breast cancer in her maternal aunt; Celiac disease in her mother and sister; Diabetes in her mother, sister, and sister; Heart disease in her mother; Prostate cancer in her father. Allergies  Allergen Reactions   Crestor  [Rosuvastatin ] Other (See Comments)    Bilateral Leg pain   Lipitor [Atorvastatin  Calcium ]     Muscle aches   Nsaids    Nortriptyline Rash   Nortriptyline Hcl Rash    Red rash   Vicodin [Hydrocodone-Acetaminophen] Itching    Review of Systems  Constitutional:  Negative for malaise/fatigue and  weight loss.  Eyes:  Negative for blurred vision.  Respiratory:  Negative for shortness of breath.   Cardiovascular:  Negative for chest pain.  Gastrointestinal:  Positive for abdominal pain. Negative for blood in stool, melena, nausea and vomiting.  Neurological:  Negative for dizziness, weakness and headaches.      Objective:     BP (!) 146/80 (BP Location: Left Arm, Patient Position: Sitting, Cuff Size: Normal)   Pulse 78   Temp 97.7 F (36.5 C) (Oral)   Wt 163 lb 4.8 oz (74.1 kg)   SpO2 97%   BMI 28.93 kg/m  BP Readings from Last 3 Encounters:  06/07/23 (!) 146/80  03/22/23 122/80  03/14/23 134/80   Wt Readings from  Last 3 Encounters:  06/07/23 163 lb 4.8 oz (74.1 kg)  03/22/23 159 lb (72.1 kg)  03/14/23 154 lb 4.8 oz (70 kg)      Physical Exam Vitals reviewed.  Constitutional:      Appearance: She is well-developed.  Eyes:     Pupils: Pupils are equal, round, and reactive to light.  Neck:     Thyroid : No thyromegaly.     Vascular: No JVD.  Cardiovascular:     Rate and Rhythm: Normal rate and regular rhythm.     Heart sounds:     No gallop.  Pulmonary:     Effort: Pulmonary effort is normal. No respiratory distress.     Breath sounds: Normal breath sounds. No wheezing or rales.  Abdominal:     Palpations: Abdomen is soft. There is no mass.     Tenderness: There is no abdominal tenderness. There is no guarding or rebound.  Musculoskeletal:     Cervical back: Neck supple.  Neurological:     Mental Status: She is alert.      No results found for any visits on 06/07/23.  Last CBC Lab Results  Component Value Date   WBC 8.4 03/14/2023   HGB 14.7 03/14/2023   HCT 44.7 03/14/2023   MCV 95.9 03/14/2023   MCH 32.0 06/30/2017   RDW 14.4 03/14/2023   PLT 207.0 03/14/2023   Last metabolic panel Lab Results  Component Value Date   GLUCOSE 117 (H) 03/14/2023   NA 138 03/14/2023   K 3.9 03/14/2023   CL 98 03/14/2023   CO2 28 03/14/2023   BUN 19 03/14/2023   CREATININE 0.79 03/14/2023   GFR 77.50 03/14/2023   CALCIUM  10.5 03/14/2023   PHOS 3.5 12/12/2016   PROT 8.1 03/14/2023   ALBUMIN 4.8 03/14/2023   BILITOT 0.5 03/14/2023   ALKPHOS 74 03/14/2023   AST 35 03/14/2023   ALT 46 (H) 03/14/2023   Last lipids Lab Results  Component Value Date   CHOL 312 (H) 03/14/2023   HDL 49.70 03/14/2023   LDLCALC 198 (H) 03/14/2023   LDLDIRECT 193.0 01/21/2022   TRIG 323.0 (H) 03/14/2023   CHOLHDL 6 03/14/2023   Last hemoglobin A1c Lab Results  Component Value Date   HGBA1C 6.2 03/14/2023   Last thyroid  functions Lab Results  Component Value Date   TSH 5.10 03/14/2023       The 10-year ASCVD risk score (Arnett DK, et al., 2019) is: 24.6%    Assessment & Plan:   #1 recent ultrasound showing steatosis changes with recent ALT of 46.  Handout on fatty liver given.  She does not drink any alcohol regularly.  We strongly advocate to try to lose some weight.  She has repeat CMP ordered for this  Friday.  #2 history of prediabetes blood sugar with 6.2% A1c recently.  Again discussed importance of low glycemic diet and getting back to regular exercise and recommend repeat A1c in about 3 months  #3 history of gout.  No recent flareups.  Patient has decided she would like to come off allopurinol .  She will be in touch if she has any further flareups.  Discussed low purine diet  #4 hyperlipidemia with total cholesterol 312 and LDL 198.  Prior intolerance to statins.  We had another discussion today regarding PCSK9 inhibitors and she will consider.  Handout given.  Glean Lamy, MD

## 2023-06-08 ENCOUNTER — Other Ambulatory Visit: Payer: Self-pay | Admitting: Rheumatology

## 2023-06-08 NOTE — Telephone Encounter (Signed)
Last Fill: 03/10/2023   Next Visit: 06/13/2023   Last Visit: 12/09/2022   Dx: DDD (degenerative disc disease), lumbar    Current Dose per office note on 12/09/2022: Flexeril 5 mg p.o. nightly every other night for muscle spasms.    Okay to refill Flexeril?

## 2023-06-09 ENCOUNTER — Other Ambulatory Visit: Payer: Self-pay

## 2023-06-09 ENCOUNTER — Other Ambulatory Visit (INDEPENDENT_AMBULATORY_CARE_PROVIDER_SITE_OTHER): Payer: Medicare Other

## 2023-06-09 ENCOUNTER — Ambulatory Visit (HOSPITAL_COMMUNITY)
Admission: RE | Admit: 2023-06-09 | Discharge: 2023-06-09 | Disposition: A | Discharge status: Home / Self Care | Payer: Medicare Other | Source: Ambulatory Visit | Attending: Gastroenterology | Admitting: Gastroenterology

## 2023-06-09 ENCOUNTER — Other Ambulatory Visit: Payer: Medicare Other

## 2023-06-09 ENCOUNTER — Encounter: Payer: Self-pay | Admitting: Gastroenterology

## 2023-06-09 DIAGNOSIS — K760 Fatty (change of) liver, not elsewhere classified: Secondary | ICD-10-CM

## 2023-06-09 DIAGNOSIS — R1012 Left upper quadrant pain: Secondary | ICD-10-CM | POA: Diagnosis present

## 2023-06-09 DIAGNOSIS — N289 Disorder of kidney and ureter, unspecified: Secondary | ICD-10-CM

## 2023-06-09 DIAGNOSIS — K838 Other specified diseases of biliary tract: Secondary | ICD-10-CM

## 2023-06-09 LAB — COMPREHENSIVE METABOLIC PANEL
ALT: 25 U/L (ref 0–35)
AST: 23 U/L (ref 0–37)
Albumin: 4.7 g/dL (ref 3.5–5.2)
Alkaline Phosphatase: 77 U/L (ref 39–117)
BUN: 17 mg/dL (ref 6–23)
CO2: 29 meq/L (ref 19–32)
Calcium: 9.7 mg/dL (ref 8.4–10.5)
Chloride: 98 meq/L (ref 96–112)
Creatinine, Ser: 0.68 mg/dL (ref 0.40–1.20)
GFR: 90.09 mL/min (ref >60.00)
Glucose, Bld: 68 mg/dL — ABNORMAL LOW (ref 70–99)
Potassium: 4.1 meq/L (ref 3.5–5.1)
Sodium: 136 meq/L (ref 135–145)
Total Bilirubin: 0.4 mg/dL (ref 0.2–1.2)
Total Protein: 7.9 g/dL (ref 6.0–8.3)

## 2023-06-09 MED ORDER — IOHEXOL 300 MG/ML  SOLN
100.0000 mL | Freq: Once | INTRAMUSCULAR | Status: AC | PRN
  Administered 2023-06-09: 100 mL via INTRAVENOUS

## 2023-06-12 ENCOUNTER — Telehealth: Payer: Self-pay

## 2023-06-12 NOTE — Telephone Encounter (Signed)
Patient had imaging done on 06/09/2023 and would like for PCP to review. Patient also would like to start a new cholesterol medication and inquired if PCP has any recommendations?

## 2023-06-12 NOTE — Telephone Encounter (Signed)
Copied from CRM [id number]. Topic: General - Other >> Jun 12, 2023  8:48 AM Kathryne Eriksson wrote: Reason for CRM: Request For Call Back >> Jun 12, 2023  9:00 AM Kathryne Eriksson wrote: Patient is requesting that Dr. Lucie Leather nurse give her a call back, it's in regards to some medical results she received.  Call back number [phone]

## 2023-06-13 ENCOUNTER — Ambulatory Visit: Payer: Medicare Other | Admitting: Physician Assistant

## 2023-06-13 DIAGNOSIS — F5101 Primary insomnia: Secondary | ICD-10-CM

## 2023-06-13 DIAGNOSIS — M5030 Other cervical disc degeneration, unspecified cervical region: Secondary | ICD-10-CM

## 2023-06-13 DIAGNOSIS — Z862 Personal history of diseases of the blood and blood-forming organs and certain disorders involving the immune mechanism: Secondary | ICD-10-CM

## 2023-06-13 DIAGNOSIS — Z8639 Personal history of other endocrine, nutritional and metabolic disease: Secondary | ICD-10-CM

## 2023-06-13 DIAGNOSIS — M797 Fibromyalgia: Secondary | ICD-10-CM

## 2023-06-13 DIAGNOSIS — M51369 Other intervertebral disc degeneration, lumbar region without mention of lumbar back pain or lower extremity pain: Secondary | ICD-10-CM

## 2023-06-13 DIAGNOSIS — M67911 Unspecified disorder of synovium and tendon, right shoulder: Secondary | ICD-10-CM

## 2023-06-13 DIAGNOSIS — Z8669 Personal history of other diseases of the nervous system and sense organs: Secondary | ICD-10-CM

## 2023-06-13 DIAGNOSIS — R5383 Other fatigue: Secondary | ICD-10-CM

## 2023-06-13 DIAGNOSIS — M8589 Other specified disorders of bone density and structure, multiple sites: Secondary | ICD-10-CM

## 2023-06-13 DIAGNOSIS — Z8679 Personal history of other diseases of the circulatory system: Secondary | ICD-10-CM

## 2023-06-13 DIAGNOSIS — Z8719 Personal history of other diseases of the digestive system: Secondary | ICD-10-CM

## 2023-06-13 DIAGNOSIS — M7061 Trochanteric bursitis, right hip: Secondary | ICD-10-CM

## 2023-06-13 DIAGNOSIS — M19041 Primary osteoarthritis, right hand: Secondary | ICD-10-CM

## 2023-06-14 NOTE — Telephone Encounter (Signed)
Patient informed of the message below and voiced understanding  

## 2023-06-14 NOTE — Telephone Encounter (Signed)
Patient informed of the message below and voiced understanding. Patient inquired if she should be concerned with impression #3 on CT scan regarding Aortic atherosclerosis? Patient reported she will do research on Repatha and get back to [place] on whether she would like to start this.

## 2023-06-19 ENCOUNTER — Telehealth: Payer: Self-pay

## 2023-06-19 DIAGNOSIS — R7303 Prediabetes: Secondary | ICD-10-CM

## 2023-06-19 DIAGNOSIS — R739 Hyperglycemia, unspecified: Secondary | ICD-10-CM

## 2023-06-19 DIAGNOSIS — E785 Hyperlipidemia, unspecified: Secondary | ICD-10-CM

## 2023-06-19 MED ORDER — SIMVASTATIN 20 MG PO TABS: 20.0000 mg | ORAL_TABLET | Freq: Every day | ORAL | 0 refills | Status: DC

## 2023-06-19 NOTE — Telephone Encounter (Signed)
Copied from CRM [id number]. Topic: General - Other >> Jun 19, 2023 10:33 AM Truddie Crumble wrote: Reason for CRM: patient called stating she would like to be started on simvastatin

## 2023-06-19 NOTE — Addendum Note (Signed)
Addended by: Christy Sartorius on: 06/19/2023 04:37 PM   Modules accepted: Orders

## 2023-06-19 NOTE — Telephone Encounter (Signed)
Patient informed of the message below and voiced understanding. RX sent and labs placed

## 2023-07-03 ENCOUNTER — Ambulatory Visit (HOSPITAL_BASED_OUTPATIENT_CLINIC_OR_DEPARTMENT_OTHER): Payer: Medicare Other | Admitting: Cardiovascular Disease

## 2023-07-03 ENCOUNTER — Other Ambulatory Visit: Payer: Self-pay | Admitting: Family Medicine

## 2023-07-03 ENCOUNTER — Encounter (HOSPITAL_BASED_OUTPATIENT_CLINIC_OR_DEPARTMENT_OTHER): Payer: Self-pay | Admitting: Cardiovascular Disease

## 2023-07-03 ENCOUNTER — Encounter (HOSPITAL_BASED_OUTPATIENT_CLINIC_OR_DEPARTMENT_OTHER): Payer: Self-pay | Admitting: *Deleted

## 2023-07-03 VITALS — BP 120/72 | HR 53 | Ht 63.0 in | Wt 162.8 lb

## 2023-07-03 DIAGNOSIS — Z789 Other specified health status: Secondary | ICD-10-CM | POA: Insufficient documentation

## 2023-07-03 DIAGNOSIS — E7800 Pure hypercholesterolemia, unspecified: Secondary | ICD-10-CM

## 2023-07-03 DIAGNOSIS — I10 Essential (primary) hypertension: Secondary | ICD-10-CM | POA: Diagnosis not present

## 2023-07-03 DIAGNOSIS — R7303 Prediabetes: Secondary | ICD-10-CM | POA: Diagnosis not present

## 2023-07-03 DIAGNOSIS — I700 Atherosclerosis of aorta: Secondary | ICD-10-CM | POA: Diagnosis not present

## 2023-07-03 HISTORY — DX: Atherosclerosis of aorta: I70.0

## 2023-07-03 HISTORY — DX: Other specified health status: Z78.9

## 2023-07-03 MED ORDER — FENOFIBRATE 150 MG PO CAPS: 1.0000 | ORAL_CAPSULE | Freq: Every day | ORAL | 3 refills | Status: DC

## 2023-07-03 NOTE — Patient Instructions (Addendum)
 Medication Instructions:  START FENOFIBRATE  150 MG DAILY   CONSIDER STARTING REPATHA  INJECTIONS   *If you need a refill on your cardiac medications before your next appointment, please call your pharmacy*  Lab Work: NONE   Testing/Procedures: NONE  Follow-Up: At [facility], you and your health needs are our priority.  As part of our continuing mission to provide you with exceptional heart care, we have created designated Provider Care Teams.  These Care Teams include your primary Cardiologist (physician) and Advanced Practice Providers (APPs -  Physician Assistants and Nurse Practitioners) who all work together to provide you with the care you need, when you need it.  We recommend signing up for the patient portal called "MyChart".  Sign up information is provided on this After Visit Summary.  MyChart is used to connect with patients for Virtual Visits (Telemedicine).  Patients are able to view lab/test results, encounter notes, upcoming appointments, etc.  Non-urgent messages can be sent to your provider as well.   To learn more about what you can do with MyChart, go to [email or web address].    Your next appointment:   6 month(s)  Provider:   Maudine Sos, MD or Neomi Banks, NP    Other Instructions [place] AT LEAST 5 MINUTES DAILY     Evolocumab  Injection What is this medication? EVOLOCUMAB  (e voe LOK ue mab) treats high cholesterol. It may also be used to lower the risk of heart attack, stroke, and a type of heart surgery. It works by decreasing bad cholesterol (such as LDL) in your blood. It is a monoclonal antibody. Changes to diet and exercise are often combined with this medication. This medicine may be used for other purposes; ask your health care provider or pharmacist if you have questions. COMMON BRAND NAME(S): Repatha , Repatha  SureClick What should I tell my care team before I take this medication? They need to know if you have any of these  conditions: An unusual or allergic reaction to evolocumab , latex, other medications, foods, dyes, or preservatives Pregnant or trying to get pregnant Breast-feeding How should I use this medication? This medication is injected under the skin. You will be taught how to prepare and give it. Take it as directed on the prescription label at the same time every day. Keep taking it unless your care team tells you to stop. It is important that you put your used needles and syringes in a special sharps container. Do not put them in a trash can. If you do not have a sharps container, call your pharmacist or care team to get one. This medication comes with INSTRUCTIONS FOR USE. Ask your pharmacist for directions on how to use this medication. Read the information carefully. Talk to your pharmacist or care team if you have questions. Talk to your care team about the use of this medication in children. While it may be prescribed for children as young as 10 years for selected conditions, precautions do apply. Overdosage: If you think you have taken too much of this medicine contact a poison control center or emergency room at once. NOTE: This medicine is only for you. Do not share this medicine with others. What if I miss a dose? It is important not to miss any doses. Talk to your care team about what to do if you miss a dose. What may interact with this medication? Interactions are not expected. This list may not describe all possible interactions. Give your health care provider a list of all the  medicines, herbs, non-prescription drugs, or dietary supplements you use. Also tell them if you smoke, drink alcohol, or use illegal drugs. Some items may interact with your medicine. What should I watch for while using this medication? Visit your care team for regular checks on your progress. Tell your care team if your symptoms do not start to get better or if they get worse. You may need blood work while you are  taking this medication. Do not wear the on-body infuser during an MRI. Taking this medication is only part of a total heart healthy program. Ask your care team if there are other changes you can make to improve your overall health. What side effects may I notice from receiving this medication? Side effects that you should report to your care team as soon as possible: Allergic reactions or angioedema--skin rash, itching or hives, swelling of the face, eyes, lips, tongue, arms, or legs, trouble swallowing or breathing Side effects that usually do not require medical attention (report to your care team if they continue or are bothersome): Back pain Flu-like symptoms--fever, chills, muscle pain, cough, headache, fatigue Pain, redness, or irritation at injection site Runny or stuffy nose Sore throat This list may not describe all possible side effects. Call your doctor for medical advice about side effects. You may report side effects to FDA at 1-800-FDA-1088. Where should I keep my medication? Keep out of the reach of children and pets. Store in a refrigerator or at room temperature between 20 and 25 degrees C (68 and 77 degrees F). Refrigeration (preferred): Store it in the refrigerator. Do not freeze. Keep it in the original carton until you are ready to take it. Remove the dose from the carton about 30 minutes before it is time for you to take it. Get rid of any unused medication after the expiration date. Room temperature: This medication may be stored at room temperature for up to 30 days. Keep it in the original carton until you are ready to take it. If it is stored at room temperature, get rid of any unused medication after 30 days or after it expires, whichever is first. Protect from light. Do not shake. Avoid exposure to extreme heat. To get rid of medications that are no longer needed or have expired: Take the medication to a medication take-back program. Check with your pharmacy or law  enforcement to find a location. If you cannot return the medication, ask your pharmacist or care team how to get rid of this medication safely. NOTE: This sheet is a summary. It may not cover all possible information. If you have questions about this medicine, talk to your doctor, pharmacist, or health care provider.  2024 Elsevier/Gold Standard (2021-06-18 00:00:00)

## 2023-07-03 NOTE — Progress Notes (Signed)
 Cardiology Office Note:  .   Date:  07/03/2023  ID:  Kimberly Stein, DOB 03-Oct-1955, MRN [id number] PCP: Marquetta Sit, MD  [facility] Providers Cardiologist:  Maudine Sos, MD    History of Present Illness: .   Kimberly Stein is a 68 y.o. female  with hypertension, hyperlipidemia, hepatic steatosis, hypertriglyceridemia, and tobacco abuse who presents for follow.  Kimberly Stein was seen on 01/2015 after she had an abnormal stress test.  She underwent LHC on 02/25/15, which revealed no coronary artery disease.  She followed up with Krisitin Alvstad 04/2015 due to myalgias on atorvastatin .  Given her lack of ASCVD she was not a candidate for PCSK9 inhibitors.  However, she was willing to retry rosuvastatin .  She developed myalgias and had to stop it.  Her triglycerides were over 700 so she was started on fenofibrate .     Kimberly Stein continues to work with Dr. Darren Em on her lipids. She had lipids checked 07/2016 and triglycerides were much better.  However, her LDL remained elevated.  He recommended starting atorvastatin  and she picked up the prescription but didn't start it.  She was eating a lot of cheese and salami in order to avoid foods that would exacerbate her Celiac disease.  Lately she has been doing well.  She struggles with pain in her shoulder and lower back.  She walks a couple times per day for 1 to 1.5 miles.  She can't walk as fast due to pain.  She has no chest pain or shortness of breath.  She denies LE edema, orthopnea or PND.  She continues to smoke 1 ppd and isn't ready to quit.  She thinks that she is too anxious.  She is frustrated that she can't get flexeril  anymore after she turns 65.  She tried Zetia  and has myalgias similar to statins.  She has stopped eating red meat and limited cheese.     She recently saw Dr. Darren Em and her LDL cholesterol was 198.  She has previously not tolerated statins.  He recommended a PCSK9 inhibitor and she decided to think about  it.  Kimberly Stein is unable to tolerate statins due to severe leg cramps, which disrupt her sleep. She attempted simvastatin  for about five to six days but discontinued it due to these side effects. She recalls previously being on fenofibrate  and is seeking to restart it as her cholesterol levels have risen again.  She has a history of atherosclerosis and fatty liver disease. She is in a pre-diabetic stage and has celiac disease, complicating her dietary choices. She has been managing her cholesterol through diet, avoiding high-cholesterol foods and sugary drinks like Frappuccinos. Despite these efforts, she has not observed significant weight loss, although she feels better and her clothes fit differently. Severe cramps improved with increased water intake, suggesting prior dehydration.  She has not been exercising much due to cramping and hip pain, which worsens with the use of an elliptical machine. She is considering starting with walking as a form of exercise. She has experienced occasional chest tightness while sitting, but not during exertion.  Her current medications include losartan  for blood pressure management, which is well-controlled at 120/72.      ROS:  As per HPI  Studies Reviewed: Aaron Aas   EKG Interpretation Date/Time:  Monday July 03 2023 11:03:50 EST Ventricular Rate:  53 PR Interval:  156 QRS Duration:  78 QT Interval:  428 QTC Calculation: 401 R Axis:   23  Text Interpretation:  Sinus bradycardia When compared with ECG of 10-Jul-2009 14:14, No significant change was found Confirmed by Maudine Sos ([id number]) on 07/03/2023 11:28:18 AM     Risk Assessment/Calculations:            Physical Exam:   VS:  BP 120/72 (BP Location: Left Arm, Patient Position: Sitting, Cuff Size: Normal)   Pulse (!) 53   Ht 5\' 3"  (1.6 m)   Wt 162 lb 12.8 oz (73.8 kg)   SpO2 95%   BMI 28.84 kg/m  , BMI Body mass index is 28.84 kg/m. GENERAL:  Well appearing HEENT: Pupils equal round  and reactive, fundi not visualized, oral mucosa unremarkable NECK:  No jugular venous distention, waveform within normal limits, carotid upstroke brisk and symmetric, no bruits, no thyromegaly LUNGS:  Clear to auscultation bilaterally HEART:  RRR.  PMI not displaced or sustained,S1 and S2 within normal limits, no S3, no S4, no clicks, no rubs, no murmurs ABD:  Flat, positive bowel sounds normal in frequency in pitch, no bruits, no rebound, no guarding, no midline pulsatile mass, no hepatomegaly, no splenomegaly EXT:  2 plus pulses throughout, no edema, no cyanosis no clubbing SKIN:  No rashes no nodules NEURO:  Cranial nerves II through XII grossly intact, motor grossly intact throughout PSYCH:  Cognitively intact, oriented to person place and time   ASSESSMENT AND PLAN: .    # Hyperlipidemia High cholesterol, previously intolerant to statins due to muscle aches. Patient requests refill of fenofibrate . Discussed the benefits of newer injectable cholesterol medications (Repatha  or Praluent) which are more effective and do not cause muscle aches. -Refill fenofibrate  for 30 days per patient request. -Patient to consider trial of Repatha  or Praluent and communicate decision via MyChart or phone call.  # Aortic Atherosclerosis Known history, patient aware and motivated to improve lifestyle factors. -Encouraged continuation of dietary changes and initiation of regular exercise, starting with walking. - LDL goal <70  # Non-alcoholic fatty liver disease Patient aware and making dietary changes. -Encouraged continuation of dietary changes and initiation of regular exercise, starting with walking.  # Pre-diabetes Patient aware and making dietary changes. -Encouraged continuation of dietary changes and initiation of regular exercise, starting with walking.   General Health Maintenance -Plan for colonoscopy, patient to find new provider due to retirement of previous provider. -Follow-up in 6  months or sooner if patient decides to start Repatha  or Praluent.      Signed, Maudine Sos, MD

## 2023-07-04 ENCOUNTER — Other Ambulatory Visit (HOSPITAL_COMMUNITY): Payer: Self-pay

## 2023-07-04 ENCOUNTER — Telehealth: Payer: Self-pay | Admitting: Pharmacy Technician

## 2023-07-04 ENCOUNTER — Telehealth: Payer: Self-pay | Admitting: Cardiovascular Disease

## 2023-07-04 ENCOUNTER — Other Ambulatory Visit: Payer: Self-pay | Admitting: Family Medicine

## 2023-07-04 MED ORDER — REPATHA SURECLICK 140 MG/ML ~~LOC~~ SOAJ: 140.0000 mg | SUBCUTANEOUS | 6 refills | Status: DC

## 2023-07-04 NOTE — Telephone Encounter (Signed)
Pharmacy Patient Advocate Encounter   Received notification from Pt Calls Messages that prior authorization for repatha is required/requested.   Insurance verification completed.   The patient is insured through [employer] .   Per test claim: PA required; PA submitted to above mentioned insurance via CoverMyMeds Key/confirmation #/EOC [id number] Status is pending

## 2023-07-04 NOTE — Telephone Encounter (Signed)
Pt called in after her appt from yesterday. She states she would like to try Repatha.

## 2023-07-04 NOTE — Telephone Encounter (Signed)
Called and spoke to patient. Patient made aware of PA approval and copay cost. She verbalized understanding. Will send to local pharmacy. Special instructions sent to patient through MyChart.

## 2023-07-04 NOTE — Telephone Encounter (Signed)
Called and spoke to patient. She stated she would like to start Repatha. Message sent to PA team to start PA on Repatha. Patient aware that once PA is approved or denied, she will receive call. She verbalized understanding.

## 2023-07-04 NOTE — Telephone Encounter (Signed)
Pharmacy Patient Advocate Encounter  Received notification from [employer] that Prior Authorization for Repatha has been APPROVED from 06/04/23 to 05/22/2098. Ran test claim, Copay is $43.00- one month. This test claim was processed through [facility]- copay amounts may vary at other pharmacies due to pharmacy/plan contracts, or as the patient moves through the different stages of their insurance plan.   PA #/Case ID/Reference #: [id number]

## 2023-07-06 ENCOUNTER — Telehealth (HOSPITAL_BASED_OUTPATIENT_CLINIC_OR_DEPARTMENT_OTHER): Payer: Self-pay | Admitting: Cardiovascular Disease

## 2023-07-06 NOTE — Telephone Encounter (Signed)
Ok to switch medication and which dose would you like?

## 2023-07-06 NOTE — Telephone Encounter (Signed)
Pt c/o medication issue:  1. Name of Medication:   Evolocumab (REPATHA SURECLICK) 140 MG/ML SOAJ   2. How are you currently taking this medication (dosage and times per day)?   Did not take  3. Are you having a reaction (difficulty breathing--STAT)?   4. What is your medication issue?   Patient stated she dropped the medication when she was going to take it and called the company and they will replace the medication.  However, patient stated she does not want to take this medication at this time.  Patient stated she wants to try the statin medication - fenofibrate (TRICOR) 145 MG tablet again and take it every other day instead.  Patient wants prescription sent to [id number] - [place],  - [address] [place] [address],

## 2023-07-07 ENCOUNTER — Other Ambulatory Visit (HOSPITAL_BASED_OUTPATIENT_CLINIC_OR_DEPARTMENT_OTHER): Payer: Self-pay | Admitting: *Deleted

## 2023-07-07 NOTE — Telephone Encounter (Signed)
Received fax from [employer] requesting replacement for Repatha be sent, Rx signed by Dr Duke Salvia and faxed  Confirmation received

## 2023-07-10 NOTE — Telephone Encounter (Signed)
 July 09, 2023 Chilton Si, MD to Marlene Lard, RN     07/09/23 10:50 PM Fenofibrate is not a statin and is not appropriate for her lipid profile.  She can have a referral to [facility] or Dr. Rennis Golden to discuss options.  Advised patient She was going to start Repatha but after getting and dropping she doesn't feel comfortable  Has been taking the Simvastatin 20 mg every other day.  Had decreased fatty food intake Not interested in seeing anyone additional at this time Will have follow up labs in 2 months

## 2023-07-11 ENCOUNTER — Other Ambulatory Visit (HOSPITAL_COMMUNITY): Payer: Self-pay

## 2023-07-18 ENCOUNTER — Other Ambulatory Visit: Payer: Self-pay | Admitting: Family Medicine

## 2023-07-23 IMAGING — MR MR LUMBAR SPINE W/O CM
4 of 5 series · 26 of 48 positions shown · non-contrast
Comparison: 07/26/2011

CLINICAL DATA: Low back pain.  No known injury.

EXAM:
MRI LUMBAR SPINE WITHOUT CONTRAST
TECHNIQUE: Multiplanar, multisequence MR imaging of the lumbar spine was
performed. No intravenous contrast was administered.

[Series 3: T2 · sagittal · 4.0mm · 0.53mm/px · 6 of 17 slices shown (1 of 2)]
[im 1/17]
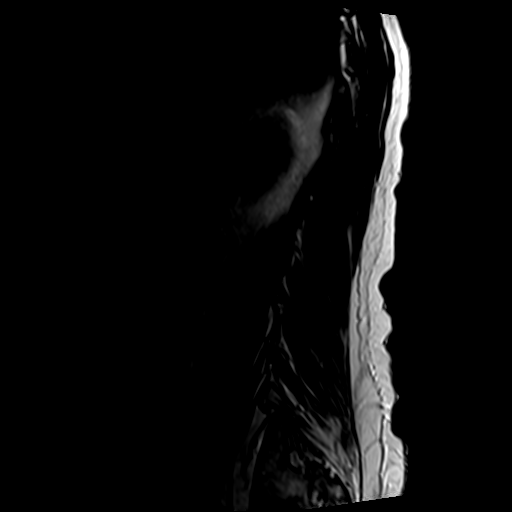
[im 4/17]
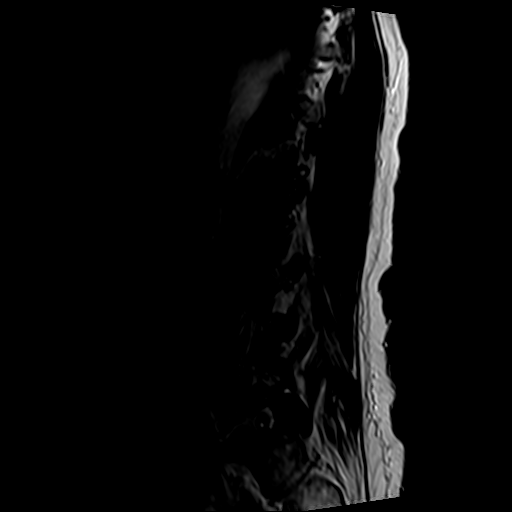
[im 7/17]
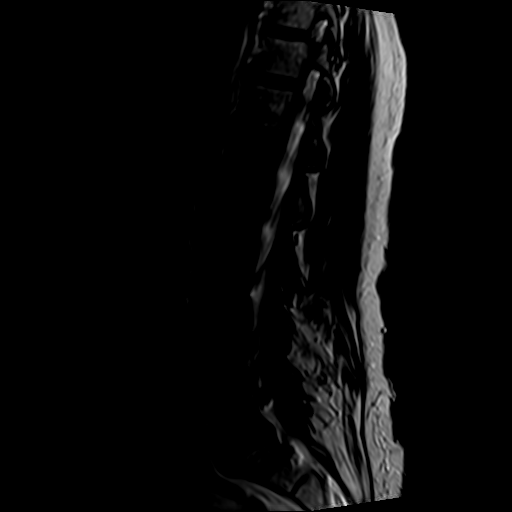
[im 10/17]
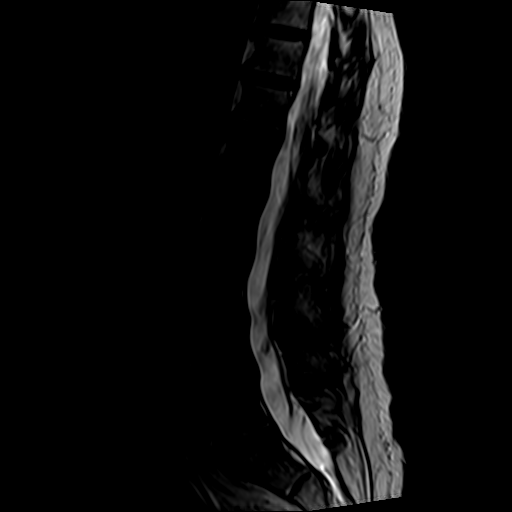
[im 13/17]
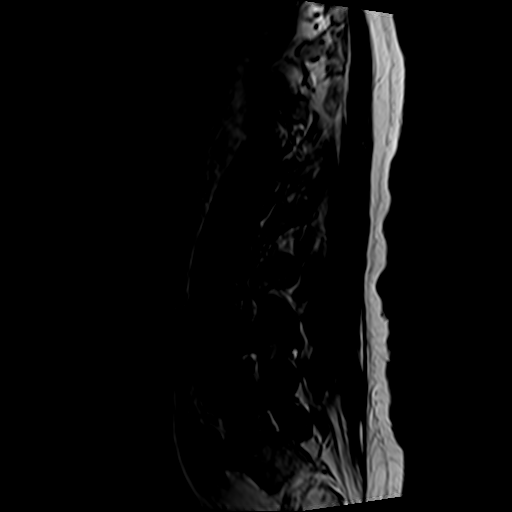
[im 17/17]
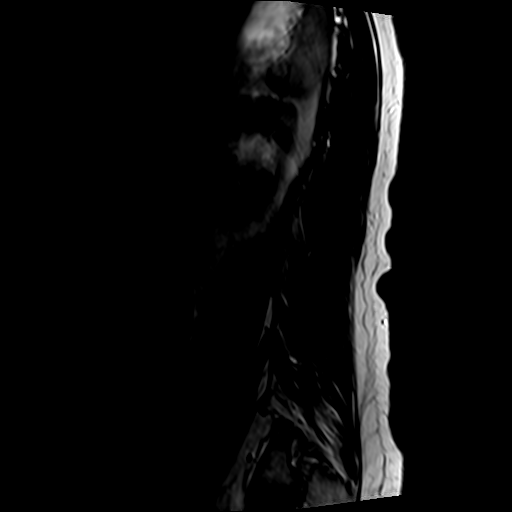

[Series 5: T1 · sagittal · 4.0mm · 0.53mm/px · 6 of 17 slices shown (1 of 2)]
[im 1/17]
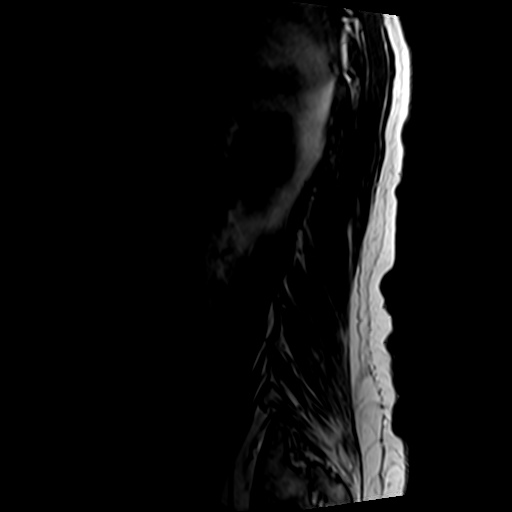
[im 4/17]
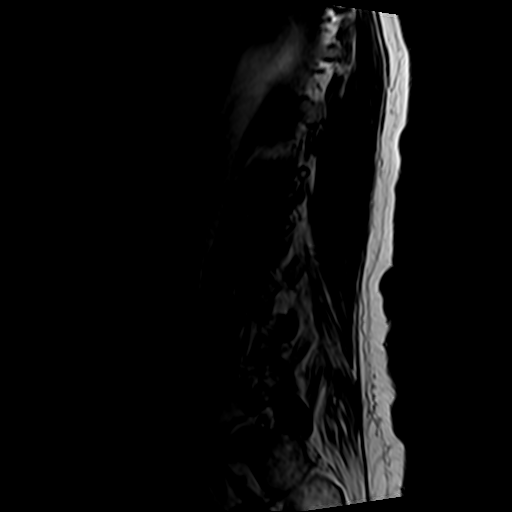
[im 7/17]
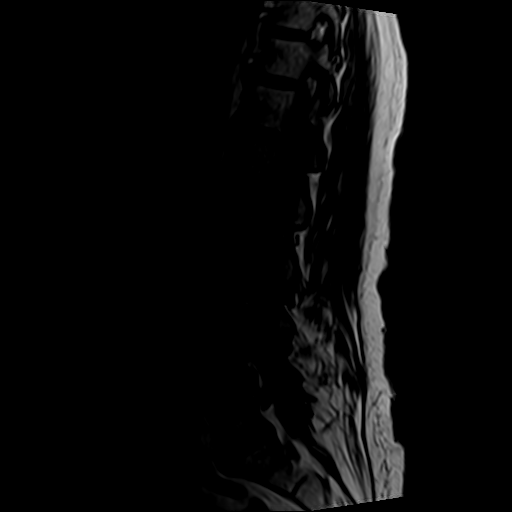
[im 10/17]
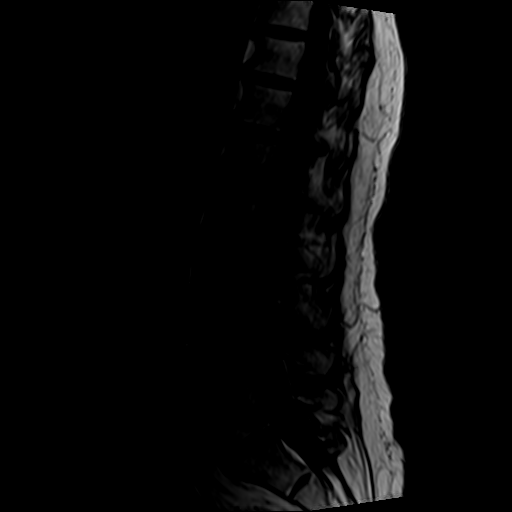
[im 13/17]
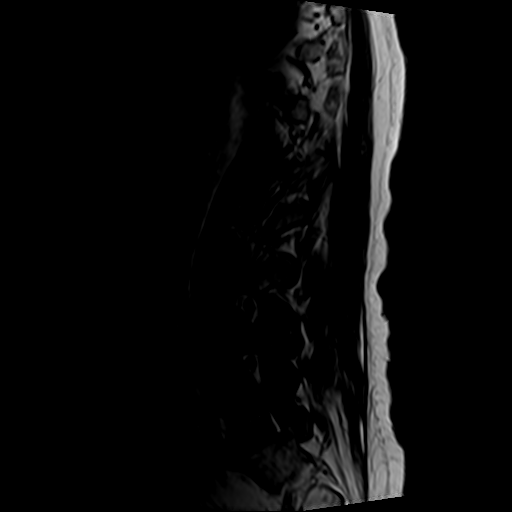
[im 17/17]
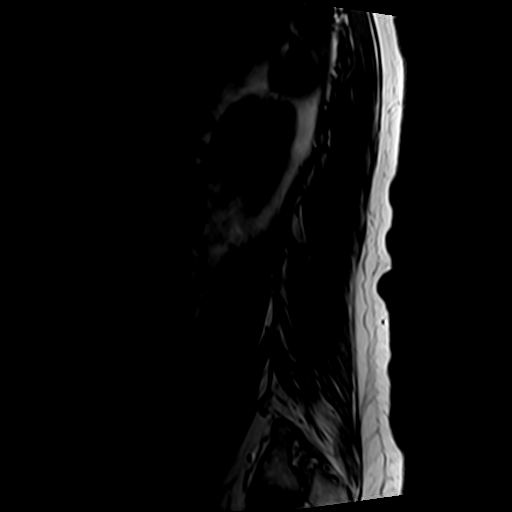

[Series 6: T2 · axial · 4.0mm · 0.78mm/px · z∈[-20,+218]mm · 9 of 45 slices shown (2 of 2)]
[im 1/45]
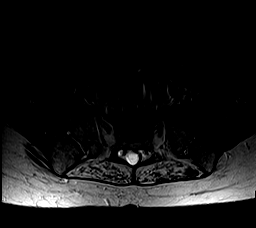
[im 7/45]
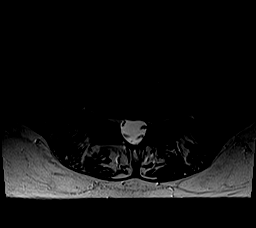
[im 13/45]
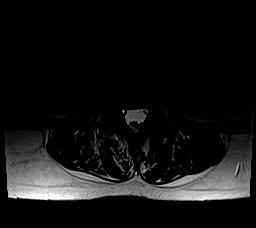
[im 19/45]
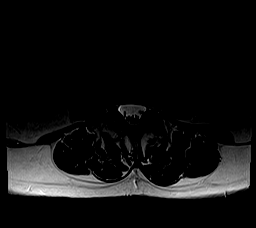
[im 23/45]
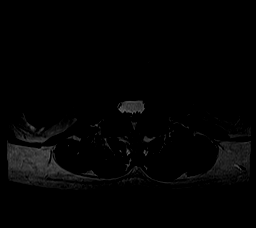
[im 26/45]
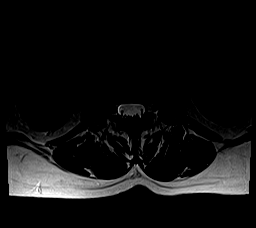
[im 32/45]
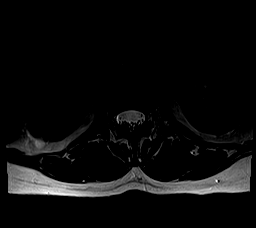
[im 38/45]
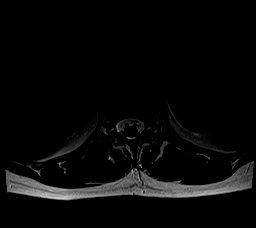
[im 45/45]
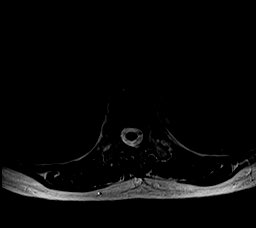

[Series 7: T1 · axial · 4.0mm · 0.39mm/px · z∈[-20,+182]mm · 5 of 45 slices shown (2 of 2)]
[im 1/45]
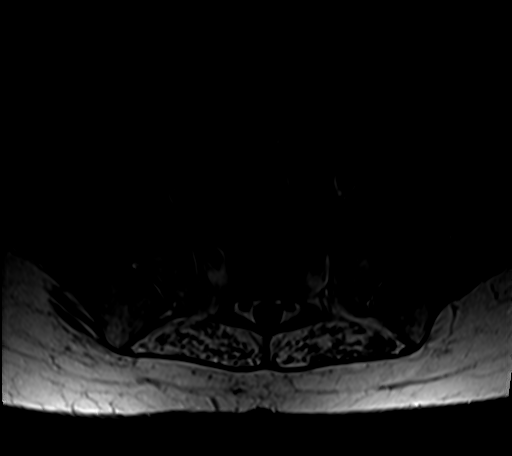
[im 7/45]
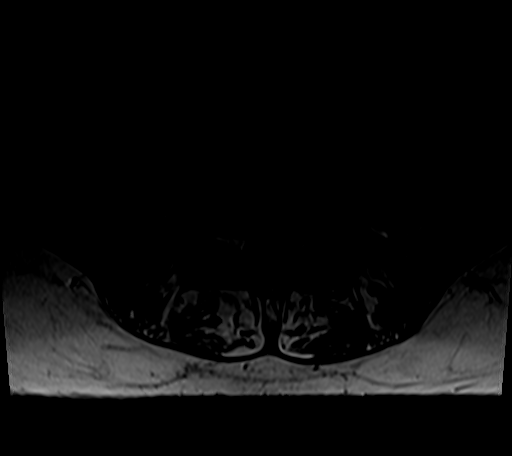
[im 13/45]
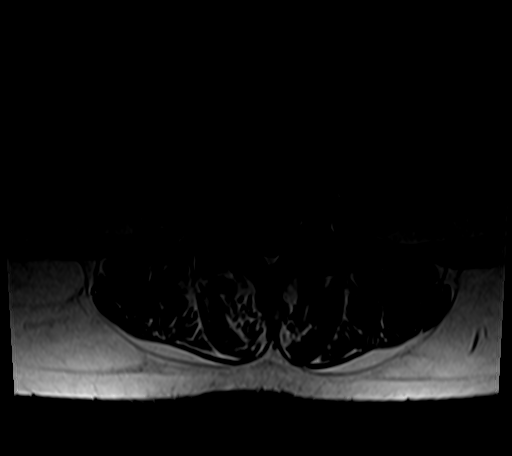
[im 23/45]
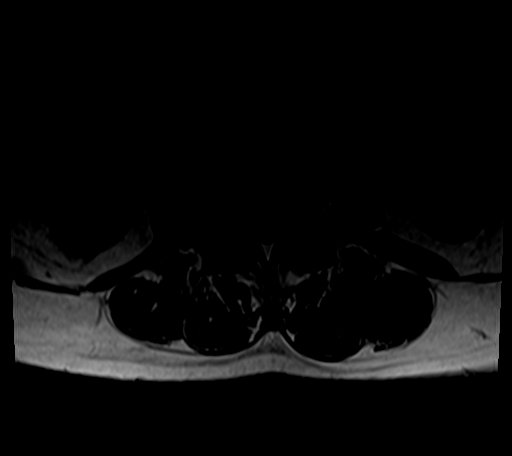
[im 38/45]
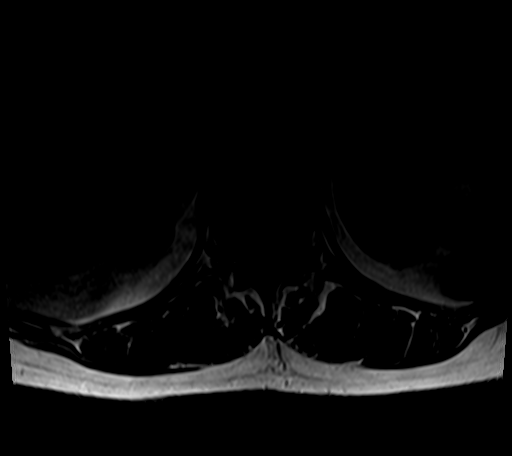

[26 of 48 positions shown; findings below may reference images not displayed]

FINDINGS: Segmentation:  Standard.

Alignment: Minimal grade 1 anterolisthesis of L3 on L4 secondary to
facet disease.

Vertebrae: No acute fracture, evidence of discitis, or aggressive
bone lesion.

Conus medullaris and cauda equina: Conus extends to the L1 level.
Conus and cauda equina appear normal.

Paraspinal and other soft tissues: No acute paraspinal abnormality.

Disc levels:

Disc spaces: Degenerative disease with disc height loss at L5-S1.
Disc desiccation at L3-4 for and L4-5.

T12-L1: No significant disc bulge. No neural foraminal stenosis. No
central canal stenosis.

L1-L2: No significant disc bulge. No neural foraminal stenosis. No
central canal stenosis.

L2-L3: No significant disc bulge. No neural foraminal stenosis. No
central canal stenosis.

L3-L4: Mild broad-based disc bulge. Mild bilateral facet
arthropathy. No foraminal or central canal stenosis.

L4-L5: Mild broad-based disc bulge with a shallow right paracentral
disc protrusion. No foraminal or central canal stenosis.

L5-S1: Minimal broad-based disc bulge. Mild left foraminal stenosis.
No right foraminal stenosis. No spinal stenosis.
IMPRESSION: 1. Mild lower lumbar spine spondylosis as described above.
2. No acute osseous injury of the lumbar spine.

## 2023-08-02 ENCOUNTER — Ambulatory Visit: Payer: Medicare Other | Admitting: Family Medicine

## 2023-09-04 ENCOUNTER — Other Ambulatory Visit: Payer: Self-pay | Admitting: Family Medicine

## 2023-09-15 ENCOUNTER — Other Ambulatory Visit: Payer: Self-pay | Admitting: Family Medicine

## 2023-09-29 ENCOUNTER — Encounter: Payer: Self-pay | Admitting: Gastroenterology

## 2023-09-29 ENCOUNTER — Other Ambulatory Visit (INDEPENDENT_AMBULATORY_CARE_PROVIDER_SITE_OTHER)

## 2023-09-29 ENCOUNTER — Ambulatory Visit (INDEPENDENT_AMBULATORY_CARE_PROVIDER_SITE_OTHER): Admitting: Gastroenterology

## 2023-09-29 VITALS — BP 128/70 | HR 53 | Ht 63.0 in | Wt 158.0 lb

## 2023-09-29 DIAGNOSIS — K900 Celiac disease: Secondary | ICD-10-CM | POA: Diagnosis not present

## 2023-09-29 DIAGNOSIS — R1012 Left upper quadrant pain: Secondary | ICD-10-CM | POA: Diagnosis not present

## 2023-09-29 DIAGNOSIS — K8681 Exocrine pancreatic insufficiency: Secondary | ICD-10-CM

## 2023-09-29 DIAGNOSIS — K219 Gastro-esophageal reflux disease without esophagitis: Secondary | ICD-10-CM

## 2023-09-29 DIAGNOSIS — Z860101 Personal history of adenomatous and serrated colon polyps: Secondary | ICD-10-CM

## 2023-09-29 LAB — IBC + FERRITIN
Ferritin: 40.3 ng/mL (ref 10.0–291.0)
Iron: 78 ug/dL (ref 42–145)
Saturation Ratios: 19.3 % — ABNORMAL LOW (ref 20.0–50.0)
TIBC: 404.6 ug/dL (ref 250.0–450.0)
Transferrin: 289 mg/dL (ref 212.0–360.0)

## 2023-09-29 LAB — B12 AND FOLATE PANEL
Folate: 6 ng/mL (ref >5.9)
Vitamin B-12: 334 pg/mL (ref 211–911)

## 2023-09-29 LAB — VITAMIN D 25 HYDROXY (VIT D DEFICIENCY, FRACTURES): VITD: 33.73 ng/mL (ref 30.00–100.00)

## 2023-09-29 MED ORDER — OMEPRAZOLE 40 MG PO CPDR
40.0000 mg | DELAYED_RELEASE_CAPSULE | Freq: Every day | ORAL | 1 refills | Status: DC
Start: 2023-09-29 — End: 2024-03-25

## 2023-09-29 NOTE — Progress Notes (Addendum)
 Chief Complaint:abdominal pain, celiac disease Primary GI Doctor: (previously Dr. Sandrea Cruel) Dr. Yvone Herd  HPI:  Patient is a  68 year old female patient, previously known to Dr. Sandrea Cruel, with past medical history of celiac disease, GERD, fibromyalgia, and tubulovillous adenomatous colon polyps,who presents for a complaint of abdominal pain. She was last seen by Dr. Sandrea Cruel on 03/22/23 for abdominal pain and celiac disease.  ---Per last visit- patient having LUQ pain and fullness with intermittent cramping. She was overdue for surveillance colonoscopy and celiac disease monitoring.   03/22/23 celiac markers normal 06/09/23 CT ABD/pelvis - No acute abnormality in the abdomen or pelvis.  Patient never completed colonoscopy for polyp surveillance   GI history 04/2019 EGD-  Normal esophagus, stomach, examined duodenum.  Biopsied Path: Duodenal changes c/w mild celiac activity  08/2017 colonoscopy- One 7 mm polyp at the hepatic flexure, removed with a cold snare. Resected and retrieved. Two 3 to 5 mm polyps in the transverse colon, removed with a cold biopsy forceps. Resected and retrieved. Internal hemorrhoids. Path:TUBULAR ADENOMA (X3 FRAGMENTS).NO HIGH GRADE DYSPLASIA OR MALIGNANCY. 01/2011 EGD- scalloped folds in the descending duodenum   Interval History    Patient presents for follow-up on abdominal pain and celiac disease. Patient has history of celiac disease, per patient diagnosed about 17 years ago by Dr. Sandrea Cruel due to work up for iron deficiency. She remains on a strict gluten-free diet. Last bone density 2020 with PCP.   She complains of daily LUQ pulling sensation that is worse with bending over or movement. Not affected by eating. She tells she her flexeril  for her FB was decreased and has had more issues with pain. 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  Patient tells me recently she had a break down due to her dog passing and currently going to [facility] for opioid addiction and placed on  methadone.     Patient has GERD and currently taking Omeprazole  40 mg po daily which controls her symptoms. Patient denies dysphagia.    Patient also has chronic constipation and currently taking Senna S 4 tablets BID. She has bowel movement daily. Denies blood in stool.  Wt Readings from Last 3 Encounters:  09/29/23 158 lb (71.7 kg)  07/03/23 162 lb 12.8 oz (73.8 kg)  06/07/23 163 lb 4.8 oz (74.1 kg)    Past Medical History:  Diagnosis Date   Allergy    sinus infections   Anemia    Aortic atherosclerosis (HCC) 07/03/2023   Arthritis    Celiac disease    Fatty liver    Fibromyalgia    GERD (gastroesophageal reflux disease)    Heart murmur    Hiatal hernia    Hyperlipidemia    Hypertension    Meniere's disease    Neuromuscular disorder (HCC)    fibromyalgia   PAD (peripheral artery disease) (HCC)    Statin intolerance 07/03/2023   Thyroid  disease    hypothyroid   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    Past Surgical History:  Procedure Laterality Date   CARDIAC CATHETERIZATION N/A 02/25/2015   Procedure: Left Heart Cath and Coronary Angiography;  Surgeon: Peter M Swaziland, MD;  Location: [facility] INVASIVE CV LAB;  Service: Cardiovascular;  Laterality: N/A;   CERVICAL DISC SURGERY     CHOLECYSTECTOMY     COLONOSCOPY     DILATION AND CURETTAGE OF UTERUS     HAND SURGERY Right    right hand; calcium  deposit on wrist and under thumb and ganglion cyst and carpal tunnel  lumbar radiofrequency nerve ablation     twice yearly   MOUTH SURGERY  2016   couple times due to gum disease   ROTATOR CUFF REPAIR Left 06/07/2015   left shoulder   TOTAL ABDOMINAL HYSTERECTOMY     UPPER GASTROINTESTINAL ENDOSCOPY      Current Outpatient Medications  Medication Sig Dispense Refill   albuterol  (VENTOLIN  HFA) 108 (90 Base) MCG/ACT inhaler Inhale 2 puffs into the lungs every 6 (six) hours as needed for wheezing or shortness of breath. 18 g 2   budesonide -formoterol  (SYMBICORT ) 160-4.5  MCG/ACT inhaler INHALE 2 PUFFS INTO THE LUNGS TWICE DAILY 10.2 g 1   Calcium  Carb-Cholecalciferol (CALCIUM  600 + D PO) Take by mouth 2 (two) times daily.     chlorthalidone  (HYGROTON ) 25 MG tablet TAKE 1 TABLET(25 MG) BY MOUTH DAILY 90 tablet 1   cyclobenzaprine  (FLEXERIL ) 5 MG tablet TAKE 1 TABLET(5 MG) BY MOUTH AT BEDTIME AS NEEDED FOR MUSCLE SPASMS 30 tablet 2   FIBER PO Take by mouth in the morning and at bedtime.     levothyroxine  (SYNTHROID ) 150 MCG tablet TAKE 1 TABLET BY MOUTH EVERY DAY BEFORE BREAKFAST 90 tablet 1   losartan  (COZAAR ) 50 MG tablet TAKE 1 TABLET BY MOUTH EVERY DAY 90 tablet 1   magic mouthwash w/lidocaine  SOLN Swish and spit 5 mL 4 times daily as needed x 5 days. 200 mL 0   MAGNESIUM PO Take by mouth daily.     Multiple Vitamin (MULTIVITAMIN PO) Take by mouth daily.     Na Sulfate-K Sulfate-Mg Sulfate concentrate 17.5-3.13-1.6 GM/177ML SOLN Take by mouth once.     omeprazole  (PRILOSEC) 40 MG capsule TAKE 1 CAPSULE BY MOUTH EVERY DAY 30 TO 60 MINUTES BEFORE DINNER 90 capsule 1   potassium chloride  (KLOR-CON  M) 10 MEQ tablet TAKE 1 TABLET BY MOUTH EVERY DAY 90 tablet 3   PSYLLIUM HUSK PO Take by mouth. Take 3 pills daily     simvastatin  (ZOCOR ) 20 MG tablet TAKE 1 TABLET(20 MG) BY MOUTH DAILY 90 tablet 0   allopurinol  (ZYLOPRIM ) 300 MG tablet Take 1 tablet (300 mg total) by mouth daily. (Patient not taking: Reported on 09/29/2023) 90 tablet 3   colchicine  0.6 MG tablet Take 1 tablet (0.6 mg total) by mouth 2 (two) times daily. (Patient not taking: Reported on 09/29/2023) 20 tablet 0   oxyCODONE  (ROXICODONE ) 5 MG immediate release tablet Take 1 tablet (5 mg total) by mouth every 6 (six) hours as needed for severe pain (pain score 7-10). (Patient not taking: Reported on 09/29/2023) 15 tablet 0   No current facility-administered medications for this visit.    Allergies as of 09/29/2023 - Review Complete 09/29/2023  Allergen Reaction Noted   Crestor  [rosuvastatin ] Other (See  Comments) 02/29/2016   Lipitor [atorvastatin  calcium ]  09/12/2018   Nsaids  08/27/2019   Nortriptyline Rash 06/14/2016   Nortriptyline hcl Rash 08/23/2017   Vicodin [hydrocodone-acetaminophen] Itching 01/25/2011    Family History  Problem Relation Age of Onset   Celiac disease Mother    Diabetes Mother    Heart disease Mother    Prostate cancer Father    Diabetes Sister    Celiac disease Sister    Diabetes Sister    Breast cancer Maternal Aunt    Colon cancer Neg Hx    Esophageal cancer Neg Hx    Liver cancer Neg Hx     Review of Systems:    Constitutional: No weight loss, fever, chills, weakness or fatigue  HEENT: Eyes: No change in vision               Ears, Nose, Throat:  No change in hearing or congestion Skin: No rash or itching Cardiovascular: No chest pain, chest pressure or palpitations   Respiratory: No SOB or cough Gastrointestinal: See HPI and otherwise negative Genitourinary: No dysuria or change in urinary frequency Neurological: No headache, dizziness or syncope Musculoskeletal: No new muscle or joint pain Hematologic: No bleeding or bruising Psychiatric: No history of depression or anxiety    Physical Exam:  Vital signs: BP 128/70   Pulse (!) 53   Ht 5\' 3"  (1.6 m)   Wt 158 lb (71.7 kg)   SpO2 97%   BMI 27.99 kg/m   Constitutional: Pleasant  female appears to be in NAD, Well developed, Well nourished, alert and cooperative Throat: Oral cavity and pharynx without inflammation, swelling or lesion.  Respiratory: Respirations even and unlabored. Lungs clear to auscultation bilaterally.   Wheezing RLL. Cardiovascular: Normal S1, S2. Regular rate and rhythm. No peripheral edema, cyanosis or pallor.  Gastrointestinal:  Soft, nondistended, nontender. No rebound or guarding. Normal bowel sounds. No appreciable masses or hepatomegaly. Rectal:  Not performed.  Msk:  Symmetrical without gross deformities. Without edema, no deformity or joint abnormality.   Neurologic:  Alert and  oriented x4;  grossly normal neurologically.  Skin:   Dry and intact without significant lesions or rashes. Psychiatric: Oriented to person, place and time. Demonstrates good judgement and reason without abnormal affect or behaviors.  RELEVANT LABS AND IMAGING: CBC    Latest Ref Rng & Units 03/14/2023    9:44 AM 01/21/2022    7:34 AM 10/16/2020    8:01 AM  CBC  WBC 4.0 - 10.5 K/uL 8.4  7.7  6.7   Hemoglobin 12.0 - 15.0 g/dL 16.1  09.6  04.5   Hematocrit 36.0 - 46.0 % 44.7  42.3  38.9   Platelets 150.0 - 400.0 K/uL 207.0  165.0  184.0      CMP     Latest Ref Rng & Units 06/09/2023   11:41 AM 03/14/2023    9:44 AM 01/21/2022    7:34 AM  CMP  Glucose 70 - 99 mg/dL 68  409  811   BUN 6 - 23 mg/dL 17  19  18    Creatinine 0.40 - 1.20 mg/dL 9.14  7.82  9.56   Sodium 135 - 145 mEq/L 136  138  139   Potassium 3.5 - 5.1 mEq/L 4.1  3.9  4.3   Chloride 96 - 112 mEq/L 98  98  99   CO2 19 - 32 mEq/L 29  28  31    Calcium  8.4 - 10.5 mg/dL 9.7  21.3  08.6   Total Protein 6.0 - 8.3 g/dL 7.9  8.1  8.2   Total Bilirubin 0.2 - 1.2 mg/dL 0.4  0.5  0.3   Alkaline Phos 39 - 117 U/L 77  74  72   AST 0 - 37 U/L 23  35  23   ALT 0 - 35 U/L 25  46  21      Lab Results  Component Value Date   TSH 5.10 03/14/2023     Assessment: Encounter Diagnoses  Name Primary?   Celiac disease Yes   Hx of adenomatous colonic polyps    Gastroesophageal reflux disease, unspecified whether esophagitis present    LUQ abdominal pain       68 year old female patient with history  of celiac disease who follows a strict gluten-free diet.  Will go ahead and check vitamin levels today.  She is also due for bone density scan which she does with her PCP.  Patient states she will see him in the next week or 2 and will request.  The left upper quadrant pain patient describes with motion or position only I suspect is most likely due to MSK etiology. She had CT scan abd/pelvis few months ago and normal.  Recommended she discuss with her doctor who sees her for fibromyalgia as well as her back specialist.  Patient's constipation is well-controlled with senna, no changes needed. Personal history of adenomatous colon polyps overdue for colonoscopy will schedule today with 2-day prep with Dr. Yvone Herd in [facility]. Her GERD is managed with omeprazole  40 mg p.o. daily no changes needed.  Plan: - Continue Omeprazole  40 mg po daily, refilled  -continue gluten free diet -recommend bone density scan (last one was 2020), will do with her PCP -Check vitamin levels (D, B12), folic acid, ferritin, iron. -Schedule for a colonoscopy with 2 day prep in [facility] with Dr. Yvone Herd. The risks and benefits of colonoscopy with possible polypectomy / biopsies were discussed and the patient agrees to proceed.  -Follow up in 1 year with Dr. Yvone Herd   Thank you for the courtesy of this consult. Please call me with any questions or concerns.   Stephane Niemann, FNP-C  Gastroenterology 09/29/2023, 9:40 AM  Cc: Marquetta Sit, MD  I have reviewed the clinic note as outlined by Arlon Lamb, NP and agree with the assessment, plan and medical decision making.  Ms. Bi has a history of celiac disease, GERD, constipation and tubulovillous adenoma.  Presents to discuss left upper quadrant abdominal pain that seems to be musculoskeletal in nature.  Agree with updated labs for celiac monitoring including nutritional parameters.  Appropriate to update DEXA scan.  She is due for colonoscopy for adenomatous polyp surveillance.  This will be scheduled at the [facility] with a 2-day bowel prep given her history of constipation.  Continue omeprazole  at current dose.  Eugenia Hess, MD

## 2023-09-29 NOTE — Patient Instructions (Addendum)
 Recommend bone density scan (last one was 2020), discuss with PCP  Your provider has requested that you go to the basement level for lab work before leaving today. Press "B" on the elevator. The lab is located at the first door on the left as you exit the elevator.  You have been scheduled for a colonoscopy. Please follow written instructions given to you at your visit today.   If you use inhalers (even only as needed), please bring them with you on the day of your procedure.  DO NOT TAKE 7 DAYS PRIOR TO TEST- Trulicity (dulaglutide) Ozempic, Wegovy (semaglutide) Mounjaro (tirzepatide) Bydureon Bcise (exanatide extended release)  DO NOT TAKE 1 DAY PRIOR TO YOUR TEST Rybelsus (semaglutide) Adlyxin (lixisenatide) Victoza (liraglutide) Byetta (exanatide) ___________________________________________________________________________  Due to recent changes in healthcare laws, you may see the results of your imaging and laboratory studies on MyChart before your provider has had a chance to review them.  We understand that in some cases there may be results that are confusing or concerning to you. Not all laboratory results come back in the same time frame and the provider may be waiting for multiple results in order to interpret others.  Please give us  48 hours in order for your provider to thoroughly review all the results before contacting the office for clarification of your results.  _______________________________________________________  If your blood pressure at your visit was 140/90 or greater, please contact your primary care physician to follow up on this.  _______________________________________________________  If you are age 89 or older, your body mass index should be between 23-30. Your Body mass index is 27.99 kg/m. If this is out of the aforementioned range listed, please consider follow up with your Primary Care Provider.  If you are age 49 or younger, your body mass index  should be between 19-25. Your Body mass index is 27.99 kg/m. If this is out of the aformentioned range listed, please consider follow up with your Primary Care Provider.   ________________________________________________________  The [facility] providers would like to encourage you to use MYCHART to communicate with providers for non-urgent requests or questions.  Due to long hold times on the telephone, sending your provider a message by [facility] may be a faster and more efficient way to get a response.  Please allow 48 business hours for a response.  Please remember that this is for non-urgent requests.  _______________________________________________________ Thank you for trusting me with your gastrointestinal care!   Dyanna Glasgow, RNP

## 2023-09-30 ENCOUNTER — Other Ambulatory Visit: Payer: Self-pay | Admitting: Family Medicine

## 2023-10-03 ENCOUNTER — Ambulatory Visit: Payer: Self-pay | Admitting: Gastroenterology

## 2023-10-31 NOTE — Telephone Encounter (Addendum)
 Maudine Sos, MD to Me    10/31/23  4:34 PM Just the statin  Mychart message sent to patient to have follow up labs

## 2023-11-03 ENCOUNTER — Other Ambulatory Visit: Payer: Self-pay | Admitting: Family Medicine

## 2023-11-09 ENCOUNTER — Ambulatory Visit: Admitting: Internal Medicine

## 2023-12-06 ENCOUNTER — Telehealth: Payer: Self-pay | Admitting: Gastroenterology

## 2023-12-06 NOTE — Telephone Encounter (Signed)
 PT is calling to update us  on some information concerning health issues she's having. She has been dealing with hypoglycemia. She had testing last week for A-FIB and she is also in drug rehab and would like to discuss new medications she is on. Please advise.

## 2023-12-12 ENCOUNTER — Other Ambulatory Visit

## 2023-12-15 ENCOUNTER — Ambulatory Visit: Admitting: Family Medicine

## 2023-12-15 ENCOUNTER — Other Ambulatory Visit

## 2023-12-15 ENCOUNTER — Ambulatory Visit: Attending: Family Medicine

## 2023-12-15 VITALS — BP 130/76 | HR 52 | Temp 97.5°F | Ht 63.0 in | Wt 149.5 lb

## 2023-12-15 DIAGNOSIS — R42 Dizziness and giddiness: Secondary | ICD-10-CM

## 2023-12-15 DIAGNOSIS — R001 Bradycardia, unspecified: Secondary | ICD-10-CM

## 2023-12-15 DIAGNOSIS — E785 Hyperlipidemia, unspecified: Secondary | ICD-10-CM

## 2023-12-15 DIAGNOSIS — R7303 Prediabetes: Secondary | ICD-10-CM | POA: Diagnosis not present

## 2023-12-15 DIAGNOSIS — E038 Other specified hypothyroidism: Secondary | ICD-10-CM | POA: Diagnosis not present

## 2023-12-15 DIAGNOSIS — M109 Gout, unspecified: Secondary | ICD-10-CM

## 2023-12-15 DIAGNOSIS — R252 Cramp and spasm: Secondary | ICD-10-CM

## 2023-12-15 DIAGNOSIS — E7800 Pure hypercholesterolemia, unspecified: Secondary | ICD-10-CM | POA: Diagnosis not present

## 2023-12-15 LAB — LIPID PANEL
Cholesterol: 291 mg/dL — ABNORMAL HIGH (ref 0–200)
HDL: 44.1 mg/dL (ref >39.00)
LDL Cholesterol: 201 mg/dL — ABNORMAL HIGH (ref 0–99)
NonHDL: 247.26
Total CHOL/HDL Ratio: 7
Triglycerides: 232 mg/dL — ABNORMAL HIGH (ref 0.0–149.0)
VLDL: 46.4 mg/dL — ABNORMAL HIGH (ref 0.0–40.0)

## 2023-12-15 LAB — MAGNESIUM: Magnesium: 1.3 mg/dL — ABNORMAL LOW (ref 1.5–2.5)

## 2023-12-15 LAB — COMPREHENSIVE METABOLIC PANEL WITH GFR
ALT: 13 U/L (ref 0–35)
AST: 17 U/L (ref 0–37)
Albumin: 4.5 g/dL (ref 3.5–5.2)
Alkaline Phosphatase: 85 U/L (ref 39–117)
BUN: 33 mg/dL — ABNORMAL HIGH (ref 6–23)
CO2: 29 meq/L (ref 19–32)
Calcium: 9.6 mg/dL (ref 8.4–10.5)
Chloride: 97 meq/L (ref 96–112)
Creatinine, Ser: 1.38 mg/dL — ABNORMAL HIGH (ref 0.40–1.20)
GFR: 39.47 mL/min — ABNORMAL LOW (ref >60.00)
Glucose, Bld: 95 mg/dL (ref 70–99)
Potassium: 4.2 meq/L (ref 3.5–5.1)
Sodium: 135 meq/L (ref 135–145)
Total Bilirubin: 0.4 mg/dL (ref 0.2–1.2)
Total Protein: 8 g/dL (ref 6.0–8.3)

## 2023-12-15 LAB — HEPATIC FUNCTION PANEL
ALT: 14 U/L (ref 0–35)
AST: 19 U/L (ref 0–37)
Albumin: 4.5 g/dL (ref 3.5–5.2)
Alkaline Phosphatase: 84 U/L (ref 39–117)
Bilirubin, Direct: 0 mg/dL (ref 0.0–0.3)
Total Bilirubin: 0.3 mg/dL (ref 0.2–1.2)
Total Protein: 7.9 g/dL (ref 6.0–8.3)

## 2023-12-15 LAB — HEMOGLOBIN A1C: Hgb A1c MFr Bld: 6.5 % (ref 4.6–6.5)

## 2023-12-15 LAB — TSH: TSH: 12.41 u[IU]/mL — ABNORMAL HIGH (ref 0.35–5.50)

## 2023-12-15 LAB — URIC ACID: Uric Acid, Serum: 9.2 mg/dL — ABNORMAL HIGH (ref 2.4–7.0)

## 2023-12-15 NOTE — Progress Notes (Unsigned)
 EP to read.

## 2023-12-15 NOTE — Patient Instructions (Signed)
 Will set up home Zio patch heart monitor  Let me know if you don't receive this in one week.

## 2023-12-15 NOTE — Progress Notes (Signed)
 Established Patient Office Visit  Subjective   Patient ID: Kimberly Stein, female    DOB: 07-24-1955  Age: 68 y.o. MRN: [id number]  Chief Complaint  Patient presents with   Dizziness    Pt c/o dizzyness. First notice it about 6 months ago. Pt reports the dizzyness is not constant and doesn't last long. Pt reports her HR was 43 from life line screening. Pt added when she starts walking fast helps with dizzyness    Medical Management of Chronic Issues    Pt reports she check into drug rehab-[facility]. Been going on since April 16. On Methadone Liquid 125mg  for hip pain, sciatica.     HPI   Kimberly Stein has multiple chronic problems including history of hypertension, celiac disease, GERD, hypothyroidism, Mnire's disease, osteopenia, osteoarthritis, chronic back pain, fibromyalgia, gout, hyperlipidemia, history of opioid dependence.  Since we last saw her she checked into [facility] and has been initiated on methadone.  She had been getting oxycodone  from multiple sources prior to that.  She feels like this has been a positive benefit for her.  She is getting extensive outpatient counseling.  Recently went for [facility] screening had a heart rate of 43.  She does have some dizziness intermittently.  Not with activity such as exercise with walking.  No chest pains.  She states she has not had EKG since starting methadone.  Can see QT prolongation with methadone.  No recent syncope.  No chest pains.  Does have some intermittent leg cramps.  Severe hyperlipidemia.  Previous intolerance with Lipitor, Crestor , and now simvastatin .  Currently not taking lipid therapy.  We discussed PCSK9 inhibitors previously but she declined.  She has hypothyroidism and is on replacement.  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   Past Surgical History:  Procedure Laterality Date   CARDIAC CATHETERIZATION N/A 02/25/2015   Procedure: Left Heart Cath and Coronary Angiography;  Surgeon: Peter M Swaziland, MD;  Location: [facility] INVASIVE CV LAB;  Service: Cardiovascular;  Laterality: N/A;   CERVICAL DISC SURGERY     CHOLECYSTECTOMY     COLONOSCOPY     DILATION AND CURETTAGE OF UTERUS     HAND SURGERY Right    right hand; calcium  deposit on wrist and under thumb and ganglion cyst and carpal tunnel   lumbar radiofrequency nerve ablation     twice yearly   MOUTH SURGERY  2016   couple times due to gum disease   ROTATOR CUFF REPAIR Left 06/07/2015   left shoulder   TOTAL ABDOMINAL HYSTERECTOMY     UPPER GASTROINTESTINAL ENDOSCOPY      reports that she has been smoking cigarettes. She has a 46 pack-year smoking history. She has never been exposed to tobacco smoke. She has never used smokeless tobacco. She reports that she does not drink alcohol and does not use drugs. family history  includes Breast cancer in her maternal aunt; Celiac disease in her mother and sister; Diabetes in her mother, sister, and sister; Heart disease in her mother; Prostate cancer in her father. Allergies  Allergen Reactions   Crestor  [Rosuvastatin ] Other (See Comments)    Bilateral Leg pain   Lipitor [Atorvastatin  Calcium ]     Muscle aches   Nsaids    Nortriptyline Rash   Nortriptyline Hcl Rash    Red rash   Vicodin [Hydrocodone-Acetaminophen] Itching    Review of Systems  Constitutional:  Negative for malaise/fatigue.  Eyes:  Negative for blurred vision.  Respiratory:  Negative for shortness of breath.    Cardiovascular:  Negative for chest pain.  Neurological:  Positive for dizziness. Negative for speech change, focal weakness, loss of consciousness, weakness and headaches.      Objective:     BP 130/76 (BP Location: Left Arm, Patient Position: Sitting, Cuff Size: Normal)   Pulse (!) 52   Temp (!) 97.5 F (36.4 C) (Oral)   Ht 5' 3 (1.6 m)   Wt 149 lb 8 oz (67.8 kg)   SpO2 95%   BMI 26.48 kg/m  BP Readings from Last 3 Encounters:  12/15/23 130/76  09/29/23 128/70  07/03/23 120/72   Wt Readings from Last 3 Encounters:  12/15/23 149 lb 8 oz (67.8 kg)  09/29/23 158 lb (71.7 kg)  07/03/23 162 lb 12.8 oz (73.8 kg)      Physical Exam Vitals reviewed.  Constitutional:      General: She is not in acute distress.    Appearance: She is well-developed. She is not ill-appearing.  Eyes:     Pupils: Pupils are equal, round, and reactive to light.  Neck:     Thyroid : No thyromegaly.     Vascular: No JVD.  Cardiovascular:     Rate and Rhythm: Normal rate and regular rhythm.     Heart sounds:     No gallop.  Pulmonary:     Effort: Pulmonary effort is normal. No respiratory distress.     Breath sounds: Normal breath sounds. No wheezing or rales.  Musculoskeletal:     Cervical back: Neck supple.  Neurological:     General: No focal deficit present.     Mental Status: She is alert.      No results found for any visits on 12/15/23.  Last CBC Lab Results  Component Value Date   WBC 8.4 03/14/2023   HGB 14.7 03/14/2023   HCT 44.7 03/14/2023   MCV 95.9 03/14/2023   MCH 32.0 06/30/2017   RDW 14.4 03/14/2023   PLT 207.0 03/14/2023   Last metabolic panel Lab Results  Component Value Date   GLUCOSE 68 (L) 06/09/2023   NA 136 06/09/2023   K 4.1 06/09/2023   CL 98 06/09/2023   CO2 29 06/09/2023   BUN 17 06/09/2023   CREATININE 0.68 06/09/2023   GFR 90.09 06/09/2023   CALCIUM  9.7 06/09/2023   PHOS 3.5 12/12/2016   PROT 7.9 12/15/2023   ALBUMIN 4.5 12/15/2023    BILITOT 0.3 12/15/2023   ALKPHOS 84 12/15/2023   AST 19 12/15/2023   ALT 14 12/15/2023   Last lipids Lab Results  Component Value Date   CHOL 312 (H) 03/14/2023   HDL 49.70 03/14/2023   LDLCALC 198 (H) 03/14/2023   LDLDIRECT 193.0 01/21/2022   TRIG 323.0 (H) 03/14/2023   CHOLHDL 6 03/14/2023   Last hemoglobin A1c Lab Results  Component Value Date   HGBA1C 6.2 03/14/2023  Last thyroid  functions Lab Results  Component Value Date   TSH 5.10 03/14/2023      The 10-year ASCVD risk score (Arnett DK, et al., 2019) is: 20%    Assessment & Plan:   Problem List Items Addressed This Visit       Unprioritized   Pre-diabetes - Primary   Relevant Orders   Hemoglobin A1c   Hyperlipidemia   Relevant Orders   CMP   Lipid panel   Hypothyroidism   Relevant Orders   TSH   Other Visit Diagnoses       Cramps, extremity       Relevant Orders   Magnesium     Kimberly Stein presents with some recent bradycardia which has been mostly asymptomatic.  She has had some intermittent dizziness which does not seem to necessarily correlate with her bradycardia.  She went for [facility] screening and was noted to have a heart rate of 43.  No beta-blocker use.  Did recently start methadone so EKG was obtained which does not show any significant QT prolongation.  Does have heart rate low 40s.  No evidence for complete heart block or secondary heart block.  -Recommend home outpatient cardiac ZIO monitor - Checking labs as above. - Follow-up immediately for any progressive dizziness or other concerns - Consider cardiology referral based on home cardiac monitoring - Suspect lipids will be considerably elevated and reinitiate possible discussion of PCSK9 inhibitors  No follow-ups on file.    Wolm Scarlet, MD

## 2023-12-17 ENCOUNTER — Ambulatory Visit: Payer: Self-pay | Admitting: Family Medicine

## 2023-12-18 NOTE — Telephone Encounter (Signed)
 Pt stated that she went to her PCP who notified her that she was having Bradycardia. Pt to wear a heart monitor for 1 week. Pt was notified that I will reach out to her on 01/05/2024 for an update.  Reminder placed in Epic.  Pt verbalized understanding with all questions answered.

## 2023-12-18 NOTE — Telephone Encounter (Signed)
 Inbound call from patient requesting a call regarding previous note. States she is concerned about continuing with 8/1 colonoscopy due to cardiac concerns. States she will be having a heart monitor. Has reschedule for 9/9. Please advise, thank you

## 2023-12-21 ENCOUNTER — Ambulatory Visit

## 2023-12-21 NOTE — Progress Notes (Signed)
 Patient presented to office for assistance with applying Zio Patch. Packaging was intact. When over all instructions with patient. Applied Zio Patch to Left chest wall. Ensured Zio Patch was turned on by blinking green light. Gave patient instructions on how long to wear Zio Patch, 7 days per form. Instructed patient how to remove patch and enclose patch with symptoms log in box provided. Patient voiced understanding.

## 2023-12-22 ENCOUNTER — Encounter: Admitting: Pediatrics

## 2024-01-02 ENCOUNTER — Other Ambulatory Visit: Payer: Self-pay | Admitting: Family Medicine

## 2024-01-05 DIAGNOSIS — R42 Dizziness and giddiness: Secondary | ICD-10-CM

## 2024-01-05 DIAGNOSIS — R001 Bradycardia, unspecified: Secondary | ICD-10-CM | POA: Diagnosis not present

## 2024-01-05 NOTE — Telephone Encounter (Signed)
 Pt stated that she has turned the heart monitor in but still has not heard the results. Pt was notified that I would create a reminder in our computer system to follow up in 1 week. Pt verbalized understanding with all questions answered.

## 2024-01-12 ENCOUNTER — Telehealth: Payer: Self-pay

## 2024-01-12 ENCOUNTER — Telehealth: Payer: Self-pay | Admitting: *Deleted

## 2024-01-12 NOTE — Telephone Encounter (Signed)
   Name: Kimberly Stein  DOB: 06/28/1955  MRN: [id number]  Primary Cardiologist: Annabella Scarce, MD   Preoperative team, please contact this patient and set up a phone call appointment for further preoperative risk assessment. Please obtain consent and complete medication review. Thank you for your help. Last seen by Dr.Deer Lick on 07/03/2023. Should be ASAP as procedure is scheduled for 01/30/2024.  I confirm that guidance regarding antiplatelet and oral anticoagulation therapy has been completed and, if necessary, noted below.  Patient is not on anticoagulation or antiplatelet per review of medical record in Epic.    I also confirmed the patient resides in the state of [place] . As per [facility] Medical Board telemedicine laws, the patient must reside in the state in which the provider is licensed.   Lamarr Satterfield, NP 01/12/2024, 10:00 AM [facility]

## 2024-01-12 NOTE — Telephone Encounter (Signed)
 Pt has been scheduled tele preop appt 01/19/24. Med rec and consent are done.

## 2024-01-12 NOTE — Telephone Encounter (Signed)
 Reminder was received in Epic. Pt stated that her heart monitor results came back negative.  Pt was notified that I would reach out to her Cardiologist for clearance for her Colonoscopy.  Pt verbalized understanding with all questions answered.

## 2024-01-12 NOTE — Telephone Encounter (Signed)
 Pt has been scheduled tele preop appt 01/19/24. Med rec and consent are done.     Patient Consent for Virtual Visit        Kimberly Stein has provided verbal consent on 01/12/2024 for a virtual visit (video or telephone).   CONSENT FOR VIRTUAL VISIT FOR:  Kimberly Stein  By participating in this virtual visit I agree to the following:  I hereby voluntarily request, consent and authorize [facility] and its employed or contracted physicians, physician assistants, nurse practitioners or other licensed health care professionals (the Practitioner), to provide me with telemedicine health care services (the "Services) as deemed necessary by the treating Practitioner. I acknowledge and consent to receive the Services by the Practitioner via telemedicine. I understand that the telemedicine visit will involve communicating with the Practitioner through live audiovisual communication technology and the disclosure of certain medical information by electronic transmission. I acknowledge that I have been given the opportunity to request an in-person assessment or other available alternative prior to the telemedicine visit and am voluntarily participating in the telemedicine visit.  I understand that I have the right to withhold or withdraw my consent to the use of telemedicine in the course of my care at any time, without affecting my right to future care or treatment, and that the Practitioner or I may terminate the telemedicine visit at any time. I understand that I have the right to inspect all information obtained and/or recorded in the course of the telemedicine visit and may receive copies of available information for a reasonable fee.  I understand that some of the potential risks of receiving the Services via telemedicine include:  Delay or interruption in medical evaluation due to technological equipment failure or disruption; Information transmitted may not be sufficient (e.g. poor  resolution of images) to allow for appropriate medical decision making by the Practitioner; and/or  In rare instances, security protocols could fail, causing a breach of personal health information.  Furthermore, I acknowledge that it is my responsibility to provide information about my medical history, conditions and care that is complete and accurate to the best of my ability. I acknowledge that Practitioner's advice, recommendations, and/or decision may be based on factors not within their control, such as incomplete or inaccurate data provided by me or distortions of diagnostic images or specimens that may result from electronic transmissions. I understand that the practice of medicine is not an exact science and that Practitioner makes no warranties or guarantees regarding treatment outcomes. I acknowledge that a copy of this consent can be made available to me via my patient portal [facility] MyChart), or I can request a printed copy by calling the office of [facility].    I understand that my insurance will be billed for this visit.   I have read or had this consent read to me. I understand the contents of this consent, which adequately explains the benefits and risks of the Services being provided via telemedicine.  I have been provided ample opportunity to ask questions regarding this consent and the Services and have had my questions answered to my satisfaction. I give my informed consent for the services to be provided through the use of telemedicine in my medical care

## 2024-01-12 NOTE — Telephone Encounter (Signed)
 [facility] Pre-operative Risk Assessment     Request for surgical clearance:     Endoscopy Procedure  What type of surgery is being performed?     Colonoscopy  When is this surgery scheduled?     01/30/2024 @ 9:30 AM   What type of clearance is required ?   Medical   Are there any medications that need to be held prior to surgery and how long? None   Practice name and name of physician performing surgery?      [facility]/ Dr. Suzann   What is your office phone and fax number?      Phone- [phone]  Fax- [phone]  Anesthesia type (None, local, MAC, general) ?       MAC   Please route your response to Elspeth Munroe RN

## 2024-01-18 ENCOUNTER — Other Ambulatory Visit: Payer: Self-pay | Admitting: Family Medicine

## 2024-01-18 NOTE — Progress Notes (Unsigned)
 Virtual Visit via Telephone Note   Because of Kimberly Stein co-morbid illnesses, she is at least at moderate risk for complications without adequate follow up.  This format is felt to be most appropriate for this patient at this time.  Due to technical limitations with video connection (technology), today's appointment will be conducted as an audio only telehealth visit, and Kimberly Stein verbally agreed to proceed in this manner.   All issues noted in this document were discussed and addressed.  No physical exam could be performed with this format.  Evaluation Performed:  Preoperative cardiovascular risk assessment _____________   Date:  01/18/2024   Patient ID:  Kimberly Stein, DOB 1955-12-01, MRN [id number] Patient Location:  Home Provider location:   Office  Primary Care Provider:  Micheal Wolm ORN, MD Primary Cardiologist:  Annabella Scarce, MD  Chief Complaint / Patient Profile   68 y.o. y/o female with a h/o HTN, HLD, atherosclerosis, hepatic Stata ptosis, tobacco abuse who is pending colonoscopy and presents today for telephonic preoperative cardiovascular risk assessment.  History of Present Illness    Kimberly Stein is a 68 y.o. female who presents via audio/video conferencing for a telehealth visit today.  Pt was last seen in cardiology clinic on 07/03/2023 by Dr. Scarce.  At that time Kimberly Stein was doing well but reported experiencing occasional chest tightness while sitting and not during exertion.  Blood pressure during visit was well-controlled on current regimen.  The patient is now pending procedure as outlined above. Since her last visit, she ***  *** denies chest pain, shortness of breath, lower extremity edema, fatigue, palpitations, melena, hematuria, hemoptysis, diaphoresis, weakness, presyncope, syncope, orthopnea, and PND.   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   Past Surgical History:  Procedure Laterality Date   CARDIAC CATHETERIZATION N/A 02/25/2015   Procedure: Left Heart Cath and Coronary Angiography;  Surgeon: Peter M Swaziland, MD;  Location: [facility] INVASIVE CV LAB;  Service: Cardiovascular;  Laterality: N/A;   CERVICAL DISC SURGERY     CHOLECYSTECTOMY     COLONOSCOPY     DILATION AND CURETTAGE OF UTERUS     HAND SURGERY Right    right hand; calcium  deposit on wrist and under thumb and ganglion cyst and carpal tunnel   lumbar radiofrequency nerve ablation     twice yearly   MOUTH SURGERY  2016   couple times due to gum disease   ROTATOR CUFF REPAIR Left 06/07/2015   left shoulder   TOTAL ABDOMINAL HYSTERECTOMY     UPPER GASTROINTESTINAL ENDOSCOPY      Allergies  Allergies  Allergen Reactions   Crestor  [Rosuvastatin ] Other (See Comments)    Bilateral Leg pain   Lipitor [Atorvastatin  Calcium ]     Muscle aches   Nsaids    Nortriptyline Rash   Nortriptyline Hcl Rash  Red rash   Vicodin [Hydrocodone-Acetaminophen] Itching    Home Medications    Prior to Admission medications   Medication Sig Start Date End Date Taking? Authorizing Provider  albuterol  (VENTOLIN  HFA) 108 (90 Base) MCG/ACT inhaler Inhale 2 puffs into the lungs every 6 (six) hours as needed for wheezing or shortness of breath. 12/23/22   Burchette, Wolm ORN, MD  allopurinol  (ZYLOPRIM ) 300 MG tablet Take 1 tablet (300 mg total) by mouth daily. Patient not taking: Reported on 01/12/2024 03/14/23   Micheal Wolm ORN, MD  budesonide -formoterol   (SYMBICORT ) 160-4.5 MCG/ACT inhaler INHALE 2 PUFFS INTO THE LUNGS TWICE DAILY 01/02/24   Burchette, Wolm ORN, MD  Calcium  Carb-Cholecalciferol (CALCIUM  600 + D PO) Take by mouth 2 (two) times daily.    [provider]  chlorthalidone  (HYGROTON ) 25 MG tablet TAKE 1 TABLET(25 MG) BY MOUTH DAILY 10/02/23   Burchette, Wolm ORN, MD  colchicine  0.6 MG tablet Take 1 tablet (0.6 mg total) by mouth 2 (two) times daily. Patient not taking: Reported on 01/12/2024 01/19/23 01/19/24  Persons, Ronal Dragon, [place]  cyclobenzaprine  (FLEXERIL ) 5 MG tablet TAKE 1 TABLET(5 MG) BY MOUTH AT BEDTIME AS NEEDED FOR MUSCLE SPASMS Patient not taking: Reported on 01/12/2024 06/08/23   Dolphus Reiter, MD  FIBER PO Take by mouth in the morning and at bedtime. Patient not taking: Reported on 01/12/2024    [provider]  levothyroxine  (SYNTHROID ) 150 MCG tablet TAKE 1 TABLET BY MOUTH EVERY DAY BEFORE BREAKFAST 10/02/23   Burchette, Wolm ORN, MD  losartan  (COZAAR ) 50 MG tablet TAKE 1 TABLET BY MOUTH EVERY DAY 07/19/23   Burchette, Wolm ORN, MD  magic mouthwash w/lidocaine  SOLN Swish and spit 5 mL 4 times daily as needed x 5 days. Patient not taking: Reported on 01/12/2024 08/04/22   Mercer Clotilda SAUNDERS, MD  MAGNESIUM PO Take by mouth daily.    [provider]  Multiple Vitamin (MULTIVITAMIN PO) Take by mouth daily.    [provider]  Na Sulfate-K Sulfate-Mg Sulfate concentrate 17.5-3.13-1.6 GM/177ML SOLN Take by mouth once. 03/22/23   [provider]  omeprazole  (PRILOSEC) 40 MG capsule Take 1 capsule (40 mg total) by mouth daily. 09/29/23 01/12/24  May, Deanna J, NP  potassium chloride  (KLOR-CON  M) 10 MEQ tablet TAKE 1 TABLET BY MOUTH EVERY DAY 02/01/23   Burchette, Wolm ORN, MD  PSYLLIUM HUSK PO Take by mouth. Take 3 pills daily Patient not taking: Reported on 01/12/2024    [provider]  sennosides-docusate sodium (SENOKOT-S) 8.6-50 MG tablet Take 2 tablets by mouth daily.    [provider]  simvastatin  (ZOCOR ) 20 MG tablet TAKE 1 TABLET(20 MG) BY MOUTH DAILY Patient not taking: Reported on 01/12/2024 09/15/23   Micheal Wolm ORN, MD    Physical Exam    Vital Signs:  Kimberly Stein does not have vital signs available for review today.***  Given telephonic nature of communication, physical exam is limited. AAOx3. NAD. Normal affect.  Speech and respirations are unlabored.  Accessory Clinical Findings    None  Assessment & Plan    1.  Preoperative Cardiovascular Risk Assessment: - Patient's RCRI score is 0.4%  The patient was advised that if she develops new symptoms prior to surgery to contact our office to arrange for a follow-up visit, and she verbalized understanding.  (Reminder: Include SBE prophylaxis/Antiplatelet/Anticoag Instructions***)  A copy of this note will be routed to requesting surgeon.  Time:   Today, I have spent *** minutes with  the patient with telehealth technology discussing medical history, symptoms, and management plan.     Wyn Raddle, Jackee Shove, NP  01/18/2024, 8:18 AM

## 2024-01-19 ENCOUNTER — Ambulatory Visit: Attending: Cardiology

## 2024-01-19 DIAGNOSIS — Z01810 Encounter for preprocedural cardiovascular examination: Secondary | ICD-10-CM | POA: Diagnosis present

## 2024-01-29 NOTE — Progress Notes (Deleted)
 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 female undergoing colonoscopy for follow-up of a history of adenomatous colon polyps  Last colonoscopy performed in 2019 disclosed 3 nonadvanced tubular adenomas Colonoscopy 2012 showed a tubulovillous adenoma  No family history of colon cancer or colon polyps  Patient denies symptoms of rectal bleeding or change in bowel habits at the time of today's exam   Past Medical History:  Diagnosis Date   Allergy    sinus infections   Anemia    Aortic atherosclerosis (HCC) 07/03/2023   Arthritis    Celiac disease    Fatty liver    Fibromyalgia    GERD (gastroesophageal reflux disease)    Heart murmur    Hiatal hernia    Hyperlipidemia    Hypertension    Meniere's disease    Neuromuscular disorder (HCC)    fibromyalgia   PAD (peripheral artery disease) (HCC)    Statin intolerance 07/03/2023   Thyroid  disease    hypothyroid   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    Past Surgical History:  Procedure Laterality Date   CARDIAC CATHETERIZATION N/A 02/25/2015   Procedure: Left Heart Cath and Coronary Angiography;  Surgeon: Peter M [place], MD;  Location: [facility] INVASIVE CV LAB;  Service: Cardiovascular;  Laterality: N/A;   CERVICAL DISC SURGERY     CHOLECYSTECTOMY     COLONOSCOPY     DILATION AND CURETTAGE OF UTERUS     HAND SURGERY Right    right hand; calcium  deposit on wrist and under thumb and ganglion cyst and carpal tunnel   lumbar radiofrequency nerve ablation     twice yearly   MOUTH SURGERY  2016   couple times due to gum disease   ROTATOR CUFF REPAIR Left 06/07/2015   left shoulder   TOTAL ABDOMINAL HYSTERECTOMY     UPPER GASTROINTESTINAL ENDOSCOPY      Prior to Admission medications    Medication Sig Start Date End Date Taking? Authorizing Provider  albuterol  (VENTOLIN  HFA) 108 (90 Base) MCG/ACT inhaler Inhale 2 puffs into the lungs every 6 (six) hours as needed for wheezing or shortness of breath. 12/23/22   Burchette, Wolm ORN, MD  allopurinol  (ZYLOPRIM ) 300 MG tablet Take 1 tablet (300 mg total) by mouth daily. Patient not taking: Reported on 01/12/2024 03/14/23   Micheal Wolm ORN, MD  budesonide -formoterol  (SYMBICORT ) 160-4.5 MCG/ACT inhaler INHALE 2 PUFFS INTO THE LUNGS TWICE DAILY 01/02/24   Burchette, Wolm ORN, MD  Calcium  Carb-Cholecalciferol (CALCIUM  600 + D PO) Take by mouth 2 (two) times daily.    [provider]  chlorthalidone  (HYGROTON ) 25 MG tablet TAKE 1 TABLET(25 MG) BY MOUTH DAILY 10/02/23   Burchette, Wolm ORN, MD  colchicine  0.6 MG tablet Take 1 tablet (0.6 mg total) by mouth 2 (two) times daily. Patient not taking: Reported on 01/12/2024 01/19/23 01/19/24  Persons, Ronal Dragon, [place]  cyclobenzaprine  (FLEXERIL ) 5 MG tablet TAKE 1 TABLET(5 MG) BY MOUTH AT BEDTIME AS NEEDED FOR MUSCLE SPASMS Patient not taking: Reported on 01/12/2024 06/08/23   Dolphus Reiter, MD  FIBER PO Take by mouth in the morning and at bedtime. Patient not taking: Reported on 01/12/2024    [provider]  levothyroxine  (SYNTHROID ) 150 MCG tablet TAKE 1 TABLET BY  MOUTH EVERY DAY BEFORE BREAKFAST 10/02/23   Burchette, Wolm ORN, MD  losartan  (COZAAR ) 50 MG tablet TAKE 1 TABLET BY MOUTH EVERY DAY 01/18/24   Burchette, Wolm ORN, MD  magic mouthwash w/lidocaine  SOLN Swish and spit 5 mL 4 times daily as needed x 5 days. Patient not taking: Reported on 01/12/2024 08/04/22   Mercer Clotilda SAUNDERS, MD  MAGNESIUM PO Take by mouth daily.    [provider]  Multiple Vitamin (MULTIVITAMIN PO) Take by mouth daily.    [provider]  Na Sulfate-K Sulfate-Mg Sulfate concentrate 17.5-3.13-1.6 GM/177ML SOLN Take by mouth once. 03/22/23   [provider]  omeprazole  (PRILOSEC) 40  MG capsule Take 1 capsule (40 mg total) by mouth daily. 09/29/23 01/12/24  May, Deanna J, NP  potassium chloride  (KLOR-CON  M) 10 MEQ tablet TAKE 1 TABLET BY MOUTH EVERY DAY 02/01/23   Burchette, Wolm ORN, MD  PSYLLIUM HUSK PO Take by mouth. Take 3 pills daily Patient not taking: Reported on 01/12/2024    [provider]  sennosides-docusate sodium (SENOKOT-S) 8.6-50 MG tablet Take 2 tablets by mouth daily.    [provider]  simvastatin  (ZOCOR ) 20 MG tablet TAKE 1 TABLET(20 MG) BY MOUTH DAILY Patient not taking: Reported on 01/12/2024 09/15/23   Micheal Wolm ORN, MD    Current Outpatient Medications  Medication Sig Dispense Refill   albuterol  (VENTOLIN  HFA) 108 (90 Base) MCG/ACT inhaler Inhale 2 puffs into the lungs every 6 (six) hours as needed for wheezing or shortness of breath. 18 g 2   allopurinol  (ZYLOPRIM ) 300 MG tablet Take 1 tablet (300 mg total) by mouth daily. (Patient not taking: Reported on 01/12/2024) 90 tablet 3   budesonide -formoterol  (SYMBICORT ) 160-4.5 MCG/ACT inhaler INHALE 2 PUFFS INTO THE LUNGS TWICE DAILY 10.2 g 1   Calcium  Carb-Cholecalciferol (CALCIUM  600 + D PO) Take by mouth 2 (two) times daily.     chlorthalidone  (HYGROTON ) 25 MG tablet TAKE 1 TABLET(25 MG) BY MOUTH DAILY 90 tablet 1   colchicine  0.6 MG tablet Take 1 tablet (0.6 mg total) by mouth 2 (two) times daily. (Patient not taking: Reported on 01/12/2024) 20 tablet 0   cyclobenzaprine  (FLEXERIL ) 5 MG tablet TAKE 1 TABLET(5 MG) BY MOUTH AT BEDTIME AS NEEDED FOR MUSCLE SPASMS (Patient not taking: Reported on 01/12/2024) 30 tablet 2   FIBER PO Take by mouth in the morning and at bedtime. (Patient not taking: Reported on 01/12/2024)     levothyroxine  (SYNTHROID ) 150 MCG tablet TAKE 1 TABLET BY MOUTH EVERY DAY BEFORE BREAKFAST 90 tablet 1   losartan  (COZAAR ) 50 MG tablet TAKE 1 TABLET BY MOUTH EVERY DAY 90 tablet 1   magic mouthwash w/lidocaine  SOLN Swish and spit 5 mL 4 times daily as needed x 5 days.  (Patient not taking: Reported on 01/12/2024) 200 mL 0   MAGNESIUM PO Take by mouth daily.     Multiple Vitamin (MULTIVITAMIN PO) Take by mouth daily.     Na Sulfate-K Sulfate-Mg Sulfate concentrate 17.5-3.13-1.6 GM/177ML SOLN Take by mouth once.     omeprazole  (PRILOSEC) 40 MG capsule Take 1 capsule (40 mg total) by mouth daily. 90 capsule 1   potassium chloride  (KLOR-CON  M) 10 MEQ tablet TAKE 1 TABLET BY MOUTH EVERY DAY 90 tablet 3   PSYLLIUM HUSK PO Take by mouth. Take 3 pills daily (Patient not taking: Reported on 01/12/2024)     sennosides-docusate sodium (SENOKOT-S) 8.6-50 MG tablet Take 2 tablets by mouth daily.     simvastatin  (  ZOCOR ) 20 MG tablet TAKE 1 TABLET(20 MG) BY MOUTH DAILY (Patient not taking: Reported on 01/12/2024) 90 tablet 0   No current facility-administered medications for this visit.    Allergies as of 01/30/2024 - Review Complete 09/29/2023  Allergen Reaction Noted   Crestor  [rosuvastatin ] Other (See Comments) 02/29/2016   Lipitor [atorvastatin  calcium ]  09/12/2018   Nsaids  08/27/2019   Nortriptyline Rash 06/14/2016   Nortriptyline hcl Rash 08/23/2017   Vicodin [hydrocodone-acetaminophen] Itching 01/25/2011    Family History  Problem Relation Age of Onset   Celiac disease Mother    Diabetes Mother    Heart disease Mother    Prostate cancer Father    Diabetes Sister    Celiac disease Sister    Diabetes Sister    Breast cancer Maternal Aunt    Colon cancer Neg Hx    Esophageal cancer Neg Hx    Liver cancer Neg Hx     Social History   Socioeconomic History   Marital status: Married    Spouse name: Not on file   Number of children: 0   Years of education: Not on file   Highest education level: Not on file  Occupational History   Occupation: Housewife  Tobacco Use   Smoking status: Every Day    Current packs/day: 1.00    Average packs/day: 1 pack/day for 46.0 years (46.0 ttl pk-yrs)    Types: Cigarettes    Passive exposure: Never   Smokeless  tobacco: Never   Tobacco comments:    07/03/2023 Patient smokes a pack daily  Vaping Use   Vaping status: Never Used  Substance and Sexual Activity   Alcohol use: No    Alcohol/week: 0.0 standard drinks of alcohol   Drug use: No   Sexual activity: Not on file  Other Topics Concern   Not on file  Social History Narrative   Not on file   Social Drivers of Health   Financial Resource Strain: Low Risk  (02/08/2023)   Overall Financial Resource Strain (CARDIA)    Difficulty of Paying Living Expenses: Not hard at all  Food Insecurity: No Food Insecurity (02/08/2023)   Hunger Vital Sign    Worried About Running Out of Food in the Last Year: Never true    Ran Out of Food in the Last Year: Never true  Transportation Needs: No Transportation Needs (02/08/2023)   PRAPARE - Administrator, Civil Service (Medical): No    Lack of Transportation (Non-Medical): No  Physical Activity: Insufficiently Active (02/08/2023)   Exercise Vital Sign    Days of Exercise per Week: 3 days    Minutes of Exercise per Session: 20 min  Stress: No Stress Concern Present (02/08/2023)   [employer] of Occupational Health - Occupational Stress Questionnaire    Feeling of Stress : Not at all  Social Connections: Moderately Isolated (02/08/2023)   Social Connection and Isolation Panel    Frequency of Communication with Friends and Family: More than three times a week    Frequency of Social Gatherings with Friends and Family: More than three times a week    Attends Religious Services: Never    Database administrator or Organizations: No    Attends Banker Meetings: Never    Marital Status: Married  Catering manager Violence: Not At Risk (02/08/2023)   Humiliation, Afraid, Rape, and Kick questionnaire    Fear of Current or Ex-Partner: No    Emotionally Abused: No    Physically  Abused: No    Sexually Abused: No    Review of Systems:  All other review of systems negative except as  mentioned in the HPI.  Physical Exam: Vital signs There were no vitals taken for this visit.  General:   Alert,  Well-developed, well-nourished, pleasant and cooperative in NAD Airway:  Mallampati  Lungs:  Clear throughout to auscultation.   Heart:  Regular rate and rhythm; no murmurs, clicks, rubs,  or gallops. Abdomen:  Soft, nontender and nondistended. Normal bowel sounds.   Neuro/Psych:  Normal mood and affect. A and O x 3  Inocente Hausen, MD [facility]

## 2024-01-30 ENCOUNTER — Encounter: Admitting: Pediatrics

## 2024-01-30 ENCOUNTER — Telehealth: Payer: Self-pay | Admitting: Gastroenterology

## 2024-01-30 NOTE — Telephone Encounter (Signed)
 Received a page from this patient this morning.  She states that she drank the prep last evening then vomited approximately 60-90 minutes later.  Several episodes of nonbloody emesis.  Did not contact on-call last evening about the symptoms and decided to not drink any of the morning prep due to last evening's nausea/vomiting.  Last BM was last evening with brown stool.  Patient will not be coming in for colonoscopy today.  Told her we would contact her to reschedule with plan for alternate prep.  States that she has tolerated MiraLAX prep in the past.  All questions answered and appreciative for phone call.

## 2024-02-01 ENCOUNTER — Telehealth: Payer: Self-pay | Admitting: Pediatrics

## 2024-02-01 NOTE — Telephone Encounter (Signed)
 Received a call from patient stating she needs a fu call to discuss new treatment plans maybe miralax,states she wants to speak to steven her current plan is making her vomit/gag. Please review and advise   Thank you

## 2024-02-01 NOTE — Telephone Encounter (Signed)
 Called patient & she stated she'd call back in about 10 minutes since she was eating dinner.

## 2024-02-01 NOTE — Telephone Encounter (Signed)
 Patient returned call & asked to be rescheduled for colon, and she would prefer 2 day prep be with only miralax instead of suprep this time d/t n/v that occurred. She's been rescheduled for 10/23 at 9:30 am with Dr. Suzann in the [facility]. Updated prep instructions sent to pt via mychart. Pt verbalized all understanding.

## 2024-02-05 ENCOUNTER — Ambulatory Visit (INDEPENDENT_AMBULATORY_CARE_PROVIDER_SITE_OTHER): Admitting: Podiatry

## 2024-02-05 ENCOUNTER — Encounter: Payer: Self-pay | Admitting: Podiatry

## 2024-02-05 DIAGNOSIS — G629 Polyneuropathy, unspecified: Secondary | ICD-10-CM | POA: Diagnosis not present

## 2024-02-05 DIAGNOSIS — L600 Ingrowing nail: Secondary | ICD-10-CM

## 2024-02-05 NOTE — Progress Notes (Signed)
  Subjective:  Patient ID: Kimberly Stein, female    DOB: [birth year],   MRN: [id number]  Chief Complaint  Patient presents with   Nail Problem    I've got ingrown toenails.  I have one on the left big toe and the middle toe on my right foot.  My toenails are turning colors. They've gotten so thick.  I have several health problems.    68 y.o. female presents for concern as above. Has a history of PAD and history of back pain and in pain clinic taking methadone. Relates biggest concern is new burning and tingling in her toes. Concern for ingrowns but doing ok for now but wondering what to do about nails. Denies any current treatments for nails.  . Denies any other pedal complaints. Denies n/v/f/c.   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 CFT <3 seconds. Normal hair growth on digits. No edema.  Skin. No lacerations or abrasions bilateral feet. Bilateral nails 1-5 thickened and dystrophic.  Musculoskeletal: MMT 5/5 bilateral lower extremities in DF, PF, Inversion and Eversion. Deceased ROM in DF of ankle joint. No pain to palpation of the feet.  Neurological: Sensation intact to light touch.   Assessment:   1. Peripheral polyneuropathy   2. Ingrown nail      Plan:  Patient was evaluated and treated and all questions answered. Discussed neuropathy and etiology as well as treatment with patient.  Radiographs reviewed and discussed with  patient.  -Discussed and educated patient on diabetic foot care, especially with  regards to the vascular, neurological and musculoskeletal systems.  -Stressed the importance of good glycemic control and the detriment of not  controlling glucose levels in relation to the foot. -Discussed supportive shoes at all times and checking feet regularly.  -Follow-up with pain management doctor for prescription management of gabapentin.  -Advised on vicks vapor rub for nail softening.  -B12 normal.  -Patient to return as needed   Asberry Failing, DPM

## 2024-02-12 ENCOUNTER — Ambulatory Visit (INDEPENDENT_AMBULATORY_CARE_PROVIDER_SITE_OTHER): Payer: Medicare Other

## 2024-02-12 VITALS — Ht 63.0 in | Wt 149.0 lb

## 2024-02-12 DIAGNOSIS — Z0000 Encounter for general adult medical examination without abnormal findings: Secondary | ICD-10-CM | POA: Diagnosis not present

## 2024-02-12 NOTE — Patient Instructions (Addendum)
 Kimberly Stein,  Thank you for taking the time for your Medicare Wellness Visit. I appreciate your continued commitment to your health goals. Please review the care plan we discussed, and feel free to reach out if I can assist you further.  Medicare recommends these wellness visits once per year to help you and your care team stay ahead of potential health issues. These visits are designed to focus on prevention, allowing your provider to concentrate on managing your acute and chronic conditions during your regular appointments.  Please note that Annual Wellness Visits do not include a physical exam. Some assessments may be limited, especially if the visit was conducted virtually. If needed, we may recommend a separate in-person follow-up with your provider.  Ongoing Care Seeing your primary care provider every 3 to 6 months helps us  monitor your health and provide consistent, personalized care.   Referrals If a referral was made during today's visit and you haven't received any updates within two weeks, please contact the referred provider directly to check on the status.  Recommended Screenings:  Health Maintenance  Topic Date Due   Zoster (Shingles) Vaccine (1 of 2) 12/18/1974   Screening for Lung Cancer  Never done   Pneumococcal Vaccine for age over 77 (2 of 2 - PPSV23, PCV20, or PCV21) 09/11/2013   COVID-19 Vaccine (3 - Pfizer risk series) 02/28/2020   Colon Cancer Screening  09/13/2020   DTaP/Tdap/Td vaccine (2 - Td or Tdap) 06/27/2023   Breast Cancer Screening  12/24/2023   Medicare Annual Wellness Visit  02/11/2025   DEXA scan (bone density measurement)  Completed   Hepatitis C Screening  Completed   HPV Vaccine  Aged Out   Meningitis B Vaccine  Aged Out   Flu Shot  Discontinued       02/12/2024    1:59 PM  Advanced Directives  Does Patient Have a Medical Advance Directive? Yes  Type of Estate agent of [place];Living will  Copy of Healthcare Power of  Attorney in Chart? No - copy requested   Advance Care Planning is important because it: Ensures you receive medical care that aligns with your values, goals, and preferences. Provides guidance to your family and loved ones, reducing the emotional burden of decision-making during critical moments.  Vision: Annual vision screenings are recommended for early detection of glaucoma, cataracts, and diabetic retinopathy. These exams can also reveal signs of chronic conditions such as diabetes and high blood pressure.  Dental: Annual dental screenings help detect early signs of oral cancer, gum disease, and other conditions linked to overall health, including heart disease and diabetes.  Please see the attached documents for additional preventive care recommendations.

## 2024-02-12 NOTE — Progress Notes (Signed)
 Subjective:   Kimberly Stein is a 68 y.o. who presents for a Medicare Wellness preventive visit.  As a reminder, Annual Wellness Visits don't include a physical exam, and some assessments may be limited, especially if this visit is performed virtually. We may recommend an in-person follow-up visit with your provider if needed.  Visit Complete: Virtual I connected with  Kimberly Stein on 02/12/24 by a audio enabled telemedicine application and verified that I am speaking with the correct person using two identifiers.  Patient Location: Home  Provider Location: Home Office  I discussed the limitations of evaluation and management by telemedicine. The patient expressed understanding and agreed to proceed.  Vital Signs: Because this visit was a virtual/telehealth visit, some criteria may be missing or patient reported. Any vitals not documented were not able to be obtained and vitals that have been documented are patient reported.    Persons Participating in Visit: Patient.  AWV Questionnaire: No: Patient Medicare AWV questionnaire was not completed prior to this visit.  Cardiac Risk Factors include: advanced age (>60men, >66 women);hypertension     Objective:    Today's Vitals   02/12/24 1350  Weight: 149 lb (67.6 kg)  Height: 5' 3 (1.6 m)   Body mass index is 26.39 kg/m.     02/12/2024    1:59 PM 02/08/2023    1:18 PM 02/03/2022   12:43 PM 11/17/2020    8:06 AM 09/13/2017    8:03 AM 09/09/2016   10:34 AM 07/27/2015   10:57 AM  Advanced Directives  Does Patient Have a Medical Advance Directive? Yes Yes Yes Yes No  Yes  Yes   Type of Estate agent of [place];Living will Healthcare Power of [place];Living will Healthcare Power of [place];Living will Healthcare Power of [place];Living will  Healthcare Power of [place];Living will Healthcare Power of [place];Living will   Does patient want to make changes to medical advance directive?    No - Patient  declined   No - Patient declined   Copy of Healthcare Power of Attorney in Chart? No - copy requested No - copy requested No - copy requested No - copy requested   No - copy requested   Would patient like information on creating a medical advance directive?     No - Patient declined   No - patient declined information      Data saved with a previous flowsheet row definition    Current Medications (verified) Outpatient Encounter Medications as of 02/12/2024  Medication Sig   albuterol  (VENTOLIN  HFA) 108 (90 Base) MCG/ACT inhaler Inhale 2 puffs into the lungs every 6 (six) hours as needed for wheezing or shortness of breath.   allopurinol  (ZYLOPRIM ) 300 MG tablet Take 1 tablet (300 mg total) by mouth daily. (Patient not taking: Reported on 02/05/2024)   budesonide -formoterol  (SYMBICORT ) 160-4.5 MCG/ACT inhaler INHALE 2 PUFFS INTO THE LUNGS TWICE DAILY   Calcium  Carb-Cholecalciferol (CALCIUM  600 + D PO) Take by mouth 2 (two) times daily.   chlorthalidone  (HYGROTON ) 25 MG tablet TAKE 1 TABLET(25 MG) BY MOUTH DAILY   colchicine  0.6 MG tablet Take 1 tablet (0.6 mg total) by mouth 2 (two) times daily. (Patient not taking: Reported on 02/05/2024)   cyclobenzaprine  (FLEXERIL ) 5 MG tablet TAKE 1 TABLET(5 MG) BY MOUTH AT BEDTIME AS NEEDED FOR MUSCLE SPASMS (Patient not taking: Reported on 02/05/2024)   FIBER PO Take by mouth in the morning and at bedtime. (Patient not taking: Reported on 02/05/2024)   levothyroxine  (  SYNTHROID ) 150 MCG tablet TAKE 1 TABLET BY MOUTH EVERY DAY BEFORE BREAKFAST   losartan  (COZAAR ) 50 MG tablet TAKE 1 TABLET BY MOUTH EVERY DAY   magic mouthwash w/lidocaine  SOLN Swish and spit 5 mL 4 times daily as needed x 5 days. (Patient not taking: Reported on 02/05/2024)   MAGNESIUM PO Take by mouth daily.   Multiple Vitamin (MULTIVITAMIN PO) Take by mouth daily.   Na Sulfate-K Sulfate-Mg Sulfate concentrate 17.5-3.13-1.6 GM/177ML SOLN Take by mouth once.   omeprazole  (PRILOSEC) 40 MG capsule  Take 1 capsule (40 mg total) by mouth daily.   potassium chloride  (KLOR-CON  M) 10 MEQ tablet TAKE 1 TABLET BY MOUTH EVERY DAY   PSYLLIUM HUSK PO Take by mouth. Take 3 pills daily (Patient not taking: Reported on 02/05/2024)   sennosides-docusate sodium (SENOKOT-S) 8.6-50 MG tablet Take 2 tablets by mouth daily.   simvastatin  (ZOCOR ) 20 MG tablet TAKE 1 TABLET(20 MG) BY MOUTH DAILY (Patient not taking: Reported on 02/05/2024)   No facility-administered encounter medications on file as of 02/12/2024.    Allergies (verified) Crestor  [rosuvastatin ], Lipitor [atorvastatin  calcium ], Nsaids, Nortriptyline, Nortriptyline hcl, and Vicodin [hydrocodone-acetaminophen]   History: Past Medical History:  Diagnosis Date   Allergy    sinus infections   Anemia    Aortic atherosclerosis 07/03/2023   Arthritis    Celiac disease    Fatty liver    Fibromyalgia    GERD (gastroesophageal reflux disease)    Heart murmur    Hiatal hernia    Hyperlipidemia    Hypertension    Meniere's disease    Neuromuscular disorder (HCC)    fibromyalgia   PAD (peripheral artery disease) (HCC)    Statin intolerance 07/03/2023   Thyroid  disease    hypothyroid   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   Past Surgical History:  Procedure Laterality Date   CARDIAC CATHETERIZATION N/A 02/25/2015   Procedure: Left Heart Cath and Coronary Angiography;  Surgeon: Peter M [place], MD;  [facility];  Service: Cardiovascular;  Laterality: N/A;   CERVICAL DISC SURGERY     CHOLECYSTECTOMY     COLONOSCOPY     DILATION AND CURETTAGE OF UTERUS     HAND SURGERY Right    right hand; calcium  deposit on wrist and under thumb and ganglion cyst and carpal tunnel   lumbar radiofrequency nerve ablation     twice yearly   MOUTH SURGERY  2016   couple times due to gum disease   ROTATOR CUFF REPAIR Left 06/07/2015   left shoulder   TOTAL ABDOMINAL HYSTERECTOMY     UPPER GASTROINTESTINAL ENDOSCOPY     Family History   Problem Relation Age of Onset   Celiac disease Mother    Diabetes Mother    Heart disease Mother    Prostate cancer Father    Diabetes Sister    Celiac disease Sister    Diabetes Sister    Breast cancer Maternal Aunt    Colon cancer Neg Hx    Esophageal cancer Neg Hx    Liver cancer Neg Hx    Social History   Socioeconomic History   Marital status: Married    Spouse name: Not on file   Number of children: 0   Years of education: Not on file   Highest education level: Not on file  Occupational History   Occupation: Housewife  Tobacco Use   Smoking status: Every Day    Current packs/day: 1.00    Average packs/day: 1 pack/day  for 46.0 years (46.0 ttl pk-yrs)    Types: Cigarettes    Passive exposure: Never   Smokeless tobacco: Never   Tobacco comments:    07/03/2023 Patient smokes a pack daily  Vaping Use   Vaping status: Never Used  Substance and Sexual Activity   Alcohol use: No    Alcohol/week: 0.0 standard drinks of alcohol   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   Sexual activity: Not on file  Other Topics Concern   Not on file  Social History Narrative   Not on file   Social Drivers of Health   Financial Resource Strain: Low Risk  (02/12/2024)   Overall Financial Resource Strain (CARDIA)    Difficulty of Paying Living Expenses: Not hard at all  Food Insecurity: No Food Insecurity (02/12/2024)   Hunger Vital Sign    Worried About Running Out of Food in the Last Year: Never true    Ran Out of Food in the Last Year: Never true  Transportation Needs: No Transportation Needs (02/12/2024)   PRAPARE - Administrator, Civil Service (Medical): No    Lack of Transportation (Non-Medical): No  Physical Activity: Sufficiently Active (02/12/2024)   Exercise Vital Sign    Days of Exercise per Week: 6 days    Minutes of Exercise per Session: 50 min  Stress: No Stress Concern Present (02/12/2024)   [employer] of Occupational Health - Occupational  Stress Questionnaire    Feeling of Stress: Not at all  Social Connections: Moderately Isolated (02/12/2024)   Social Connection and Isolation Panel    Frequency of Communication with Friends and Family: More than three times a week    Frequency of Social Gatherings with Friends and Family: More than three times a week    Attends Religious Services: Never    Database administrator or Organizations: No    Attends Banker Meetings: Never    Marital Status: Married    Tobacco Counseling Ready to quit: No Counseling given: Yes Tobacco comments: 07/03/2023 Patient smokes a pack daily    Clinical Intake:  Pre-visit preparation completed: Yes  Pain : No/denies pain     BMI - recorded: 26.39 Nutritional Status: BMI 25 -29 Overweight Nutritional Risks: None Diabetes: No  Lab Results  Component Value Date   HGBA1C 6.5 12/15/2023   HGBA1C 6.2 03/14/2023   HGBA1C 6.1 01/21/2022     How often do you need to have someone help you when you read instructions, pamphlets, or other written materials from your doctor or pharmacy?: 1 - Never  Interpreter Needed?: No  Information entered by :: Rojelio Blush LPN   Activities of Daily Living      02/12/2024    1:55 PM  In your present state of health, do you have any difficulty performing the following activities:  Hearing? 0  Vision? 0  Difficulty concentrating or making decisions? 0  Walking or climbing stairs? 0  Dressing or bathing? 0  Doing errands, shopping? 0  Preparing Food and eating ? N  Using the Toilet? N  In the past six months, have you accidently leaked urine? N  Do you have problems with loss of bowel control? Y  Comment Followed by medical attention  Managing your Medications? N  Managing your Finances? N  Housekeeping or managing your Housekeeping? N    Patient Care Team: Micheal Wolm ORN, MD as PCP - General Raford Riggs, MD as PCP - Cardiology (Cardiology)  I have updated your  Care Teams any recent Medical Services you may have received from other providers in the past year.     Assessment:   This is a routine wellness examination for Kimberly Stein.  Hearing/Vision screen Hearing Screening - Comments:: Denies hearing difficulties   Vision Screening - Comments:: Wears rx glasses - up to date with routine eye exams with  [facility]   Goals Addressed               This Visit's Progress     Continue physical activity (pt-stated)        Remain active       Depression Screen      02/12/2024    1:55 PM 02/08/2023    1:15 PM 02/03/2022   12:41 PM 01/21/2022    7:03 AM 08/21/2019    7:07 AM 08/08/2018    9:04 AM 06/30/2017   11:51 AM  PHQ 2/9 Scores  PHQ - 2 Score 0 0 0 0 0 0 0  PHQ- 9 Score      0     Fall Risk      02/12/2024    1:58 PM 02/08/2023    1:16 PM 02/03/2022   12:43 PM  Fall Risk   Falls in the past year? 0 0 0  Number falls in past yr: 0 0 0  Injury with Fall? 0 0 0  Risk for fall due to : No Fall Risks No Fall Risks No Fall Risks  Follow up Falls evaluation completed Falls prevention discussed Falls prevention discussed      Data saved with a previous flowsheet row definition    MEDICARE RISK AT HOME:   Medicare Risk at Home Any stairs in or around the home?: Yes If so, are there any without handrails?: No Home free of loose throw rugs in walkways, pet beds, electrical cords, etc?: Yes Adequate lighting in your home to reduce risk of falls?: Yes Life alert?: No Use of a cane, walker or w/c?: No Grab bars in the bathroom?: Yes Shower chair or bench in shower?: Yes Elevated toilet seat or a handicapped toilet?: No  TIMED UP AND GO:  Was the test performed?  No  Cognitive Function: 6CIT completed        02/12/2024    1:59 PM 02/08/2023    1:18 PM 02/03/2022   12:44 PM  6CIT Screen  What Year? 0 points 0 points 0 points  What month? 0 points 0 points 0 points  What time? 0 points 0 points 0 points  Count back from  20 0 points 0 points 0 points  Months in reverse 0 points 0 points 0 points  Repeat phrase 0 points 0 points 0 points  Total Score 0 points 0 points 0 points    Immunizations Immunization History  Administered Date(s) Administered   PFIZER(Purple Top)SARS-COV-2 Vaccination 01/06/2020, 01/31/2020   Pneumococcal Conjugate-13 07/17/2013   Tdap 06/26/2013   Zoster, Live 07/17/2013    Screening Tests Health Maintenance  Topic Date Due   Zoster Vaccines- Shingrix (1 of 2) 12/18/1974   Lung Cancer Screening  Never done   Pneumococcal Vaccine: 50+ Years (2 of 2 - PPSV23, PCV20, or PCV21) 09/11/2013   COVID-19 Vaccine (3 - Pfizer risk series) 02/28/2020   Colonoscopy  09/13/2020   DTaP/Tdap/Td (2 - Td or Tdap) 06/27/2023   Mammogram  12/24/2023   Medicare Annual Wellness (AWV)  02/11/2025   DEXA SCAN  Completed  Hepatitis C Screening  Completed   HPV VACCINES  Aged Out   Meningococcal B Vaccine  Aged Out   Influenza Vaccine  Discontinued    Health Maintenance Items Addressed: Mammogram, Colonoscopy and Lung Cancer Screening Deferred  Additional Screening:  Vision Screening: Recommended annual ophthalmology exams for early detection of glaucoma and other disorders of the eye. Is the patient up to date with their annual eye exam?  Yes  Who is the provider or what is the name of the office in which the patient attends annual eye exams? [facility]  Dental Screening: Recommended annual dental exams for proper oral hygiene  Community Resource Referral / Chronic Care Management: CRR required this visit?  No   CCM required this visit?  No   Plan:    I have personally reviewed and noted the following in the patient's chart:   Medical and social history Use of alcohol, tobacco or illicit drugs  Current medications and supplements including opioid prescriptions. Patient is not currently taking opioid prescriptions. Functional ability and status Nutritional  status Physical activity Advanced directives List of other physicians Hospitalizations, surgeries, and ER visits in previous 12 months Vitals Screenings to include cognitive, depression, and falls Referrals and appointments  In addition, I have reviewed and discussed with patient certain preventive protocols, quality metrics, and best practice recommendations. A written personalized care plan for preventive services as well as general preventive health recommendations were provided to patient.   Rojelio LELON Blush, LPN   [id number]   After Visit Summary: (MyChart) Due to this being a telephonic visit, the after visit summary with patients personalized plan was offered to patient via MyChart   Notes: Nothing significant to report at this time.

## 2024-02-26 ENCOUNTER — Other Ambulatory Visit: Payer: Self-pay | Admitting: Family Medicine

## 2024-03-11 ENCOUNTER — Telehealth: Payer: Self-pay | Admitting: Pediatrics

## 2024-03-11 NOTE — Telephone Encounter (Signed)
 Inbound call from patient requesting to speak specifically with Kimberly Stein regarding concerns she has regarding prep instructions for upcoming colonoscopy. States she requested miralax prep but is unclear on directions. Requesting a call back. Please advise, thank you

## 2024-03-11 NOTE — Telephone Encounter (Signed)
 Pt stated that she is really concerned about her prep and was requesting that she be prescribed the Miralax prep for her upcoming procedure. Prep letter was created and sent to pt.  Pt made aware.  Pt verbalized understanding with all questions answered.

## 2024-03-12 ENCOUNTER — Telehealth: Payer: Self-pay | Admitting: *Deleted

## 2024-03-12 NOTE — Telephone Encounter (Signed)
 Spoke with pt and reminded her to do a two day Miralax/Miralax prep.  She states she is beginning to get a tooth abscess  requests to reschedule  Rescheduled to 04-26-24 at 1030.  New prep instructions sent via MyChart

## 2024-03-14 ENCOUNTER — Encounter: Admitting: Pediatrics

## 2024-03-24 ENCOUNTER — Other Ambulatory Visit: Payer: Self-pay | Admitting: Gastroenterology

## 2024-03-24 DIAGNOSIS — K219 Gastro-esophageal reflux disease without esophagitis: Secondary | ICD-10-CM

## 2024-03-30 ENCOUNTER — Other Ambulatory Visit: Payer: Self-pay | Admitting: Family Medicine

## 2024-04-09 ENCOUNTER — Other Ambulatory Visit

## 2024-04-12 ENCOUNTER — Ambulatory Visit: Admitting: Family Medicine

## 2024-04-12 ENCOUNTER — Encounter: Payer: Self-pay | Admitting: Family Medicine

## 2024-04-12 VITALS — BP 146/80 | HR 83 | Temp 97.6°F | Wt 151.4 lb

## 2024-04-12 DIAGNOSIS — R739 Hyperglycemia, unspecified: Secondary | ICD-10-CM | POA: Diagnosis not present

## 2024-04-12 DIAGNOSIS — R790 Abnormal level of blood mineral: Secondary | ICD-10-CM

## 2024-04-12 DIAGNOSIS — N289 Disorder of kidney and ureter, unspecified: Secondary | ICD-10-CM | POA: Diagnosis not present

## 2024-04-12 DIAGNOSIS — Z789 Other specified health status: Secondary | ICD-10-CM

## 2024-04-12 DIAGNOSIS — E038 Other specified hypothyroidism: Secondary | ICD-10-CM

## 2024-04-12 DIAGNOSIS — E7800 Pure hypercholesterolemia, unspecified: Secondary | ICD-10-CM

## 2024-04-12 LAB — BASIC METABOLIC PANEL WITH GFR
BUN: 23 mg/dL (ref 6–23)
CO2: 28 meq/L (ref 19–32)
Calcium: 9.8 mg/dL (ref 8.4–10.5)
Chloride: 98 meq/L (ref 96–112)
Creatinine, Ser: 1.34 mg/dL — ABNORMAL HIGH (ref 0.40–1.20)
GFR: 40.8 mL/min — ABNORMAL LOW (ref >60.00)
Glucose, Bld: 183 mg/dL — ABNORMAL HIGH (ref 70–99)
Potassium: 4.3 meq/L (ref 3.5–5.1)
Sodium: 137 meq/L (ref 135–145)

## 2024-04-12 LAB — MAGNESIUM: Magnesium: 1.3 mg/dL — ABNORMAL LOW (ref 1.5–2.5)

## 2024-04-12 LAB — TSH: TSH: 12.15 u[IU]/mL — ABNORMAL HIGH (ref 0.35–5.50)

## 2024-04-12 LAB — HEMOGLOBIN A1C: Hgb A1c MFr Bld: 6.1 % (ref 4.6–6.5)

## 2024-04-12 MED ORDER — EZETIMIBE 10 MG PO TABS: 10.0000 mg | ORAL_TABLET | Freq: Every day | ORAL | 3 refills | Status: AC

## 2024-04-12 NOTE — Progress Notes (Signed)
 Established Patient Office Visit  Subjective   Patient ID: Kimberly Stein, female    DOB: Jun 24, 1955  Age: 68 y.o. MRN: [id number]  Chief Complaint  Patient presents with   Medical Management of Chronic Issues    HPI   Kimberly Stein is here for medical follow-up.  She was here back in the summertime and had multiple lab abnormalities including low magnesium, elevated TSH, elevated lipids with LDL 201, A1c 6.5%, worsening kidney function.  She has multiple medical problems as above.  History of statin intolerance.  She is followed through methadone clinic and has recently had some increased aches and pains and been taking some over-the-counter ibuprofen.  We instructed her to avoid nonsteroidals.  She admits that she is not good at drinking a lot of fluids.  She is taking her thyroid  medication regularly.  She refuses PCSK9 inhibitors.  She does not recall trying Zetia  and would be willing to take that.  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   Past Surgical History:  Procedure Laterality Date   CARDIAC CATHETERIZATION N/A 02/25/2015   Procedure: Left Heart Cath and Coronary Angiography;  Surgeon: Peter M Jordan, MD;  Location: [facility] INVASIVE CV LAB;  Service: Cardiovascular;  Laterality: N/A;   CERVICAL DISC SURGERY     CHOLECYSTECTOMY     COLONOSCOPY     DILATION AND CURETTAGE OF UTERUS     HAND SURGERY Right    right hand; calcium  deposit on wrist and under thumb and ganglion cyst and carpal tunnel   lumbar radiofrequency  nerve ablation     twice yearly   MOUTH SURGERY  2016   couple times due to gum disease   ROTATOR CUFF REPAIR Left 06/07/2015   left shoulder   TOTAL ABDOMINAL HYSTERECTOMY     UPPER GASTROINTESTINAL ENDOSCOPY      reports that she has been smoking cigarettes. She has a 46 pack-year smoking history. She has never been exposed to tobacco smoke. She has never used smokeless tobacco. She reports that she does not drink alcohol and does not use drugs. family history includes Breast cancer in her maternal aunt; Celiac disease in her mother and sister; Diabetes in her mother, sister, and sister; Heart disease in her mother; Prostate cancer in her father. Allergies  Allergen Reactions   Crestor  [Rosuvastatin ] Other (See Comments)    Bilateral Leg pain   Lipitor [Atorvastatin  Calcium ]     Muscle aches   Nsaids    Nortriptyline Rash   Nortriptyline Hcl Rash    Red rash   Vicodin [Hydrocodone-Acetaminophen] Itching    Review of Systems  Constitutional:  Negative for chills, fever and malaise/fatigue.  Eyes:  Negative for blurred vision.  Respiratory:  Negative for shortness of breath.   Cardiovascular:  Negative for chest pain.  Gastrointestinal:  Negative for abdominal pain.  Neurological:  Negative for dizziness, weakness and headaches.  Objective:     BP (!) 146/80   Pulse 83   Temp 97.6 F (36.4 C) (Oral)   Wt 151 lb 6.4 oz (68.7 kg)   SpO2 95%   BMI 26.82 kg/m  BP Readings from Last 3 Encounters:  04/12/24 (!) 146/80  12/15/23 130/76  09/29/23 128/70   Wt Readings from Last 3 Encounters:  04/12/24 151 lb 6.4 oz (68.7 kg)  02/12/24 149 lb (67.6 kg)  12/15/23 149 lb 8 oz (67.8 kg)      Physical Exam Vitals reviewed.  Constitutional:      Appearance: She is well-developed.  Eyes:     Pupils: Pupils are equal, round, and reactive to light.  Neck:     Thyroid : No thyromegaly.     Vascular: No JVD.  Cardiovascular:     Rate and Rhythm: Normal rate and  regular rhythm.     Heart sounds:     No gallop.  Pulmonary:     Effort: Pulmonary effort is normal. No respiratory distress.     Breath sounds: Normal breath sounds. No wheezing or rales.  Musculoskeletal:     Cervical back: Neck supple.     Right lower leg: No edema.     Left lower leg: No edema.  Neurological:     Mental Status: She is alert.      No results found for any visits on 04/12/24.    The 10-year ASCVD risk score (Arnett DK, et al., 2019) is: 26%    Assessment & Plan:   Problem List Items Addressed This Visit       Unprioritized   Hyperlipidemia   Relevant Medications   ezetimibe  (ZETIA ) 10 MG tablet   Hypothyroidism   Relevant Orders   TSH   Other Visit Diagnoses       Low blood magnesium    -  Primary   Relevant Orders   Basic metabolic panel with GFR   Magnesium     Hyperglycemia       Relevant Orders   Hemoglobin A1c     Acute renal insufficiency       Relevant Orders   Basic metabolic panel with GFR     Patient seen for follow-up regarding multiple items as above.  She has been taking magnesium supplement 500 mg daily.  Recheck magnesium level.  Recheck TSH and if still elevated adjust thyroid  accordingly.  We had a long discussion regarding hyperlipidemia.  She declines PCSK9 inhibitors and has had intolerance of multiple statins previously.  She does agree to Zetia  10 mg once daily and recheck fasting lipids at follow-up  Recheck A1c.  If still climbing consider medication options.  She prefers lifestyle management first.  We discussed diet in some detail  Reiterated importance of avoiding nonsteroidals and staying well-hydrated.  Rechecking basic metabolic panel with recent acute renal insufficiency.  Return in about 6 months (around 10/10/2024).    Wolm Scarlet, MD

## 2024-04-13 ENCOUNTER — Ambulatory Visit: Payer: Self-pay | Admitting: Family Medicine

## 2024-04-13 DIAGNOSIS — R790 Abnormal level of blood mineral: Secondary | ICD-10-CM

## 2024-04-13 DIAGNOSIS — R7989 Other specified abnormal findings of blood chemistry: Secondary | ICD-10-CM

## 2024-04-13 DIAGNOSIS — E785 Hyperlipidemia, unspecified: Secondary | ICD-10-CM

## 2024-04-13 DIAGNOSIS — E038 Other specified hypothyroidism: Secondary | ICD-10-CM

## 2024-04-15 ENCOUNTER — Other Ambulatory Visit: Payer: Self-pay | Admitting: Family Medicine

## 2024-04-15 MED ORDER — LEVOTHYROXINE SODIUM 175 MCG PO TABS: 175.0000 ug | ORAL_TABLET | Freq: Every day | ORAL | 0 refills | Status: DC

## 2024-04-15 NOTE — Addendum Note (Signed)
 Addended by: METTA KRISTEN CROME on: 04/15/2024 01:42 PM   Modules accepted: Orders

## 2024-04-19 ENCOUNTER — Other Ambulatory Visit: Payer: Self-pay | Admitting: Family Medicine

## 2024-04-22 ENCOUNTER — Telehealth: Payer: Self-pay | Admitting: Gastroenterology

## 2024-04-22 NOTE — Telephone Encounter (Signed)
 Patient states she has questions regarding upcoming colonoscopy prep instructions, and medications. Requesting a call back. Please advise, thank you

## 2024-04-23 ENCOUNTER — Other Ambulatory Visit: Payer: Self-pay

## 2024-04-23 MED ORDER — ONDANSETRON 8 MG PO TBDP: 8.0000 mg | ORAL_TABLET | Freq: Three times a day (TID) | ORAL | 1 refills | Status: AC | PRN

## 2024-04-24 ENCOUNTER — Telehealth: Payer: Self-pay

## 2024-04-24 NOTE — Progress Notes (Unsigned)
 [name] History and Physical   Primary Care Physician:  Micheal Wolm ORN, MD   Reason for Procedure:  History of adenomatous colon polyps  Plan:    Colonoscopy   The patient was provided an opportunity to ask questions and all were answered. The patient agreed with the plan.   HPI: Kimberly Stein is a 68 y.o. female undergoing colonoscopy for follow-up of adenomatous colon polyps.  Patient underwent colonoscopy in 2019 which disclosed 3 subcentimeter colon polyps.  No family history of colorectal cancer or polyps.  Patient denies current symptoms or rectal bleeding or change in bowel habits.  Does have a history of chronic constipation that has not changed.   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    Past Surgical History:  Procedure Laterality Date   CARDIAC CATHETERIZATION N/A 02/25/2015   Procedure: Left Heart Cath and Coronary Angiography;  Surgeon: Peter M Jordan, MD;  Location: [facility] INVASIVE CV LAB;  Service: Cardiovascular;  Laterality: N/A;   CERVICAL DISC SURGERY     CHOLECYSTECTOMY     COLONOSCOPY     DILATION AND CURETTAGE OF UTERUS     HAND SURGERY Right    right hand; calcium  deposit on wrist and under thumb and ganglion cyst and carpal tunnel   lumbar radiofrequency nerve ablation     twice yearly   MOUTH SURGERY  2016   couple times due to gum disease   ROTATOR CUFF REPAIR Left 06/07/2015   left shoulder   TOTAL ABDOMINAL HYSTERECTOMY     UPPER GASTROINTESTINAL ENDOSCOPY       Prior to Admission medications   Medication Sig Start Date End Date Taking? Authorizing Provider  albuterol  (VENTOLIN  HFA) 108 (90 Base) MCG/ACT inhaler Inhale 2 puffs into the lungs every 6 (six) hours as needed for wheezing or shortness of breath. 12/23/22   Burchette, Wolm ORN, MD  budesonide -formoterol  (SYMBICORT ) 160-4.5 MCG/ACT inhaler INHALE 2 PUFFS INTO THE LUNGS TWICE DAILY 02/27/24   Burchette, Wolm ORN, MD  Calcium  Carb-Cholecalciferol (CALCIUM  600 + D PO) Take by mouth 2 (two) times daily.    [provider]  chlorthalidone  (HYGROTON ) 25 MG tablet TAKE 1 TABLET(25 MG) BY MOUTH DAILY 04/01/24   Burchette, Wolm ORN, MD  ezetimibe  (ZETIA ) 10 MG tablet Take 1 tablet (10 mg total) by mouth daily. 04/12/24   Burchette, Wolm ORN, MD  levothyroxine  (SYNTHROID ) 175 MCG tablet Take 1 tablet (175 mcg total) by mouth daily. 04/15/24   Burchette, Wolm ORN, MD  losartan  (COZAAR ) 50 MG tablet TAKE 1 TABLET BY MOUTH EVERY DAY 04/21/24   Burchette, Wolm ORN, MD  MAGNESIUM PO Take by mouth daily.    [provider]  Multiple Vitamin (MULTIVITAMIN PO) Take by mouth daily.    [provider]  Na Sulfate-K Sulfate-Mg Sulfate concentrate 17.5-3.13-1.6 GM/177ML SOLN Take by mouth once. 03/22/23   [provider]  omeprazole  (PRILOSEC) 40  MG capsule TAKE 1 CAPSULE(40 MG) BY MOUTH DAILY 03/25/24   Collier, Amanda R, PA-C  ondansetron  (ZOFRAN -ODT) 8 MG disintegrating tablet Take 1 tablet (8 mg total) by mouth every 8 (eight) hours as needed for nausea or vomiting. 04/23/24   Suzann Inocente HERO, MD  potassium chloride  (KLOR-CON  M) 10 MEQ tablet TAKE 1 TABLET BY MOUTH EVERY DAY 04/15/24   Burchette, Wolm ORN, MD  sennosides-docusate sodium (SENOKOT-S) 8.6-50 MG tablet Take 2 tablets by mouth daily.    [provider]  simvastatin  (ZOCOR ) 20 MG tablet TAKE 1 TABLET(20 MG) BY MOUTH DAILY Patient not taking: Reported on 02/05/2024 09/15/23   Micheal Wolm ORN, MD    Current  Outpatient Medications  Medication Sig Dispense Refill   albuterol  (VENTOLIN  HFA) 108 (90 Base) MCG/ACT inhaler Inhale 2 puffs into the lungs every 6 (six) hours as needed for wheezing or shortness of breath. 18 g 2   budesonide -formoterol  (SYMBICORT ) 160-4.5 MCG/ACT inhaler INHALE 2 PUFFS INTO THE LUNGS TWICE DAILY 30.6 g 2   Calcium  Carb-Cholecalciferol (CALCIUM  600 + D PO) Take by mouth 2 (two) times daily.     chlorthalidone  (HYGROTON ) 25 MG tablet TAKE 1 TABLET(25 MG) BY MOUTH DAILY 90 tablet 1   ezetimibe  (ZETIA ) 10 MG tablet Take 1 tablet (10 mg total) by mouth daily. 90 tablet 3   levothyroxine  (SYNTHROID ) 175 MCG tablet Take 1 tablet (175 mcg total) by mouth daily. 90 tablet 0   losartan  (COZAAR ) 50 MG tablet TAKE 1 TABLET BY MOUTH EVERY DAY 90 tablet 1   MAGNESIUM PO Take by mouth daily.     omeprazole  (PRILOSEC) 40 MG capsule TAKE 1 CAPSULE(40 MG) BY MOUTH DAILY 90 capsule 1   ondansetron  (ZOFRAN -ODT) 8 MG disintegrating tablet Take 1 tablet (8 mg total) by mouth every 8 (eight) hours as needed for nausea or vomiting. 10 tablet 1   potassium chloride  (KLOR-CON  M) 10 MEQ tablet TAKE 1 TABLET BY MOUTH EVERY DAY 90 tablet 3   Multiple Vitamin (MULTIVITAMIN PO) Take by mouth daily.     sennosides-docusate sodium (SENOKOT-S) 8.6-50 MG tablet Take 2 tablets by mouth daily.     simvastatin  (ZOCOR ) 20 MG tablet TAKE 1 TABLET(20 MG) BY MOUTH DAILY (Patient not taking: Reported on 04/26/2024) 90 tablet 0   Current Facility-Administered Medications  Medication Dose Route Frequency Provider Last Rate Last Admin   0.9 %  sodium chloride  infusion  500 mL Intravenous Once Rima Blizzard M, MD        Allergies as of 04/26/2024 - Review Complete 04/26/2024  Allergen Reaction Noted   Crestor  [rosuvastatin ] Other (See Comments) 02/29/2016   Lipitor [atorvastatin  calcium ] Other (See Comments) 09/12/2018   Nortriptyline Rash 06/14/2016   Nortriptyline hcl Rash 08/23/2017   Nsaids Other (See  Comments) 08/27/2019   Vicodin [hydrocodone-acetaminophen] Itching 01/25/2011    Family History  Problem Relation Age of Onset   Celiac disease Mother    Diabetes Mother    Heart disease Mother    Prostate cancer Father    Diabetes Sister    Celiac disease Sister    Diabetes Sister    Breast cancer Maternal Aunt    Colon cancer Neg Hx    Esophageal cancer Neg Hx    Liver cancer Neg Hx    Stomach cancer Neg Hx    Rectal cancer Neg Hx     Social History   Socioeconomic History   Marital status: Married    Spouse name: Not on file  Number of children: 0   Years of education: Not on file   Highest education level: Not on file  Occupational History   Occupation: Housewife  Tobacco Use   Smoking status: Every Day    Current packs/day: 1.00    Average packs/day: 1 pack/day for 46.0 years (46.0 ttl pk-yrs)    Types: Cigarettes    Passive exposure: Never   Smokeless tobacco: Never   Tobacco comments:    07/03/2023 Patient smokes a pack daily  Vaping Use   Vaping status: Never Used  Substance and Sexual Activity   Alcohol use: No    Alcohol/week: 0.0 standard drinks of alcohol   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, states she took 120mg  this morning   Sexual activity: Not on file  Other Topics Concern   Not on file  Social History Narrative   Not on file   Social Drivers of Health   Financial Resource Strain: Low Risk  (02/12/2024)   Overall Financial Resource Strain (CARDIA)    Difficulty of Paying Living Expenses: Not hard at all  Food Insecurity: No Food Insecurity (02/12/2024)   Hunger Vital Sign    Worried About Running Out of Food in the Last Year: Never true    Ran Out of Food in the Last Year: Never true  Transportation Needs: No Transportation Needs (02/12/2024)   PRAPARE - Administrator, Civil Service (Medical): No    Lack of Transportation (Non-Medical): No  Physical Activity: Sufficiently Active (02/12/2024)   Exercise Vital Sign     Days of Exercise per Week: 6 days    Minutes of Exercise per Session: 50 min  Stress: No Stress Concern Present (02/12/2024)   [employer] of Occupational Health - Occupational Stress Questionnaire    Feeling of Stress: Not at all  Social Connections: Moderately Isolated (02/12/2024)   Social Connection and Isolation Panel    Frequency of Communication with Friends and Family: More than three times a week    Frequency of Social Gatherings with Friends and Family: More than three times a week    Attends Religious Services: Never    Database Administrator or Organizations: No    Attends Banker Meetings: Never    Marital Status: Married  Catering Manager Violence: Not At Risk (02/12/2024)   Humiliation, Afraid, Rape, and Kick questionnaire    Fear of Current or Ex-Partner: No    Emotionally Abused: No    Physically Abused: No    Sexually Abused: No    Review of Systems:  All other review of systems negative except as mentioned in the HPI.  Physical Exam: Vital signs BP (!) 154/132   Pulse 80   Temp 98.2 F (36.8 C) (Skin)   Resp 18   Ht 5' 3 (1.6 m)   Wt 158 lb (71.7 kg)   SpO2 99%   BMI 27.99 kg/m   General:   Alert,  Well-developed, well-nourished, pleasant and cooperative in NAD Airway:  Mallampati 2 Lungs:  Clear throughout to auscultation.   Heart:  Regular rate and rhythm; no murmurs, clicks, rubs,  or gallops. Abdomen:  Soft, nontender and nondistended. Normal bowel sounds.   Neuro/Psych:  Normal mood and affect. A and O x 3  Inocente Hausen, MD [facility]

## 2024-04-24 NOTE — Telephone Encounter (Signed)
 I called Kimberly Stein today to make sure she was able to pick up the Zofran  that Dr. Suzann sent in for her.  She said she did get it but she was still concerned that it will not help with the nausea she gets from the full feeling.  I reminded her to drink slowly if that starts to happen.  She said that she is going to try her best to complete the 2 day prep.

## 2024-04-24 NOTE — Telephone Encounter (Signed)
 Note:   04/23/24  Kimberly Stein came into the office Tuesday morning around 9:00 am.  She was upset that she had not been contacted by a nurse to discuss her colonoscopy prep and she had to complete a 2 day prep.  I printed the prep instructions and reviewed them with her.  She was content that I assisted her.  She was concerned that she will have projectile vomiting again with this prep.  I explained to her that I was in clinic with Dr. Suzann and I will ask if a medication can be sent in to help her with the nausea.  Dr. Suzann had Zofran  8 mg ODT sent in to the patient's pharmacy.

## 2024-04-24 NOTE — Progress Notes (Signed)
 Error encounter.

## 2024-04-26 ENCOUNTER — Ambulatory Visit: Admitting: Pediatrics

## 2024-04-26 ENCOUNTER — Encounter: Payer: Self-pay | Admitting: Pediatrics

## 2024-04-26 VITALS — BP 140/79 | HR 58 | Temp 98.2°F | Resp 13 | Ht 63.0 in | Wt 158.0 lb

## 2024-04-26 DIAGNOSIS — D120 Benign neoplasm of cecum: Secondary | ICD-10-CM

## 2024-04-26 DIAGNOSIS — R1012 Left upper quadrant pain: Secondary | ICD-10-CM

## 2024-04-26 DIAGNOSIS — Z860101 Personal history of adenomatous and serrated colon polyps: Secondary | ICD-10-CM

## 2024-04-26 DIAGNOSIS — Z1211 Encounter for screening for malignant neoplasm of colon: Secondary | ICD-10-CM

## 2024-04-26 DIAGNOSIS — K900 Celiac disease: Secondary | ICD-10-CM

## 2024-04-26 DIAGNOSIS — K6389 Other specified diseases of intestine: Secondary | ICD-10-CM

## 2024-04-26 DIAGNOSIS — K648 Other hemorrhoids: Secondary | ICD-10-CM

## 2024-04-26 DIAGNOSIS — K219 Gastro-esophageal reflux disease without esophagitis: Secondary | ICD-10-CM

## 2024-04-26 MED ORDER — SODIUM CHLORIDE 0.9 % IV SOLN: 500.0000 mL | Freq: Once | INTRAVENOUS | Status: DC

## 2024-04-26 NOTE — Progress Notes (Signed)
 Pt states she took her maintenance dose of Methadone 120mg  at 430 this morning.

## 2024-04-26 NOTE — Op Note (Signed)
 [facility] Patient Name: Kimberly Stein Procedure Date: 04/26/2024 11:24 AM MRN: [id number] Endoscopist: Inocente Hausen , MD, [id number] Age: 68 Referring MD:  Date of Birth: 16-Aug-1955 Gender: Female Account #: [id number] Procedure:                Colonoscopy Indications:              High risk colon cancer surveillance: Personal                            history of multiple (3 or more) adenomas, Last                            colonoscopy: April 2019 Medicines:                Monitored Anesthesia Care Procedure:                Pre-Anesthesia Assessment:                           - Prior to the procedure, a History and Physical                            was performed, and patient medications and                            allergies were reviewed. The patient's tolerance of                            previous anesthesia was also reviewed. The risks                            and benefits of the procedure and the sedation                            options and risks were discussed with the patient.                            All questions were answered, and informed consent                            was obtained. Prior Anticoagulants: The patient has                            taken no anticoagulant or antiplatelet agents. ASA                            Grade Assessment: III - A patient with severe                            systemic disease. After reviewing the risks and                            benefits, the patient was deemed in satisfactory  condition to undergo the procedure.                           After obtaining informed consent, the colonoscope                            was passed under direct vision. Throughout the                            procedure, the patient's blood pressure, pulse, and                            oxygen saturations were monitored continuously. The                            [id number]-HQ190L Colonoscope [id number] was  introduced                            through the anus and advanced to the cecum,                            identified by appendiceal orifice and ileocecal                            valve. The colonoscopy was somewhat difficult due                            to inadequate bowel prep. Successful completion of                            the procedure was aided by lavage. The patient                            tolerated the procedure well. The quality of the                            bowel preparation was inadequate. The ileocecal                            valve, appendiceal orifice, and rectum were                            photographed. Scope In: 11:32:42 AM Scope Out: 11:56:14 AM Scope Withdrawal Time: 0 hours 13 minutes 58 seconds  Total Procedure Duration: 0 hours 23 minutes 32 seconds  Findings:                 Hemorrhoids were found on perianal exam.                           The digital rectal exam was normal. Pertinent                            negatives include normal sphincter tone and no  palpable rectal lesions.                           A large amount of semi-liquid stool was found in                            the entire colon, interfering with visualization.                            Lavage of the area was performed using a large                            amount of sterile water, resulting in incomplete                            clearance with fair visualization.                           A 4 mm polyp versus lymphoid aggregate was found in                            the cecum. The polyp was sessile. The polyp was                            removed with a cold biopsy forceps. Resection and                            retrieval were complete.                           A diffuse area of moderate to severe melanosis was                            found in the transverse colon, in the ascending                            colon and in the cecum.                            Internal hemorrhoids were found during retroflexion. Complications:            No immediate complications. Estimated blood loss:                            Minimal. Estimated Blood Loss:     Estimated blood loss was minimal. Impression:               - Preparation of the colon was inadequate.                           - Hemorrhoids found on perianal exam.                           - Stool in the entire examined colon.                           -  One 4 mm polyp versus lymphoid aggregate in the                            cecum, removed with a cold biopsy forceps. Resected                            and retrieved.                           - Melanosis in the colon.                           - Internal hemorrhoids. Recommendation:           - Discharge patient to home (ambulatory).                           - Await pathology results.                           - Repeat colonoscopy in 6-12 months because the                            bowel preparation was suboptimal. Colonoscopy today                            was adequate to rule out mass lesions. Retained                            stool in colon precludes ability to adequately                            detect polyps. At the time of next colonoscopy                            recommend 2-day bowel prep. Patient should also                            take magnesium citrate 6 ounces p.o. twice daily x                            5 days prior to initiating colonoscopy prep.                           - The findings and recommendations were discussed                            with the patient's family.                           - Patient has a contact number available for                            emergencies. The signs and symptoms of potential  delayed complications were discussed with the                            patient. Return to normal activities tomorrow.                             Written discharge instructions were provided to the                            patient. Inocente Hausen, MD 04/26/2024 12:03:49 PM This report has been signed electronically.

## 2024-04-26 NOTE — Patient Instructions (Addendum)
 Await pathology results.   Repeat colonoscopy in 6-12 months with additional bowel prep, because the bowel preparation was suboptimal.   YOU HAD AN ENDOSCOPIC PROCEDURE TODAY AT THE [facility]:   Refer to the procedure report that was given to you for any specific questions about what was found during the examination.  If the procedure report does not answer your questions, please call your gastroenterologist to clarify.  If you requested that your care partner not be given the details of your procedure findings, then the procedure report has been included in a sealed envelope for you to review at your convenience later.  YOU SHOULD EXPECT: Some feelings of bloating in the abdomen. Passage of more gas than usual.  Walking can help get rid of the air that was put into your GI tract during the procedure and reduce the bloating. If you had a lower endoscopy (such as a colonoscopy or flexible sigmoidoscopy) you may notice spotting of blood in your stool or on the toilet paper. If you underwent a bowel prep for your procedure, you may not have a normal bowel movement for a few days.  Please Note:  You might notice some irritation and congestion in your nose or some drainage.  This is from the oxygen used during your procedure.  There is no need for concern and it should clear up in a day or so.  SYMPTOMS TO REPORT IMMEDIATELY:  Following lower endoscopy (colonoscopy or flexible sigmoidoscopy):  Excessive amounts of blood in the stool  Significant tenderness or worsening of abdominal pains  Swelling of the abdomen that is new, acute  Fever of 100F or higher  For urgent or emergent issues, a gastroenterologist can be reached at any hour by calling [phone]. Do not use MyChart messaging for urgent concerns.    DIET:  We do recommend a small meal at first, but then you may proceed to your regular diet.  Drink plenty of fluids but you should avoid alcoholic beverages for 24  hours.  ACTIVITY:  You should plan to take it easy for the rest of today and you should NOT DRIVE or use heavy machinery until tomorrow (because of the sedation medicines used during the test).    FOLLOW UP: Our staff will call the number listed on your records the next business day following your procedure.  We will call around 7:15- 8:00 am to check on you and address any questions or concerns that you may have regarding the information given to you following your procedure. If we do not reach you, we will leave a message.     If any biopsies were taken you will be contacted by phone or by letter within the next 1-3 weeks.  Please call us  at [phone] if you have not heard about the biopsies in 3 weeks.    SIGNATURES/CONFIDENTIALITY: You and/or your care partner have signed paperwork which will be entered into your electronic medical record.  These signatures attest to the fact that that the information above on your After Visit Summary has been reviewed and is understood.  Full responsibility of the confidentiality of this discharge information lies with you and/or your care-partner.

## 2024-04-26 NOTE — Progress Notes (Signed)
 Called to room to assist during endoscopic procedure.  Patient ID and intended procedure confirmed with present staff. Received instructions for my participation in the procedure from the performing physician.

## 2024-04-29 ENCOUNTER — Telehealth: Payer: Self-pay

## 2024-04-29 NOTE — Telephone Encounter (Signed)
  Follow up Call-     04/26/2024   10:56 AM  Call back number  Post procedure Call Back phone  # [phone]  Permission to leave phone message Yes     Patient questions:  Do you have a fever, pain , or abdominal swelling? No. Pain Score  0 *  Have you tolerated food without any problems? Yes.    Have you been able to return to your normal activities? Yes.    Do you have any questions about your discharge instructions: Diet   No. Medications  No. Follow up visit  No.  Do you have questions or concerns about your Care? No.  Actions: * If pain score is 4 or above: No action needed, pain <4.

## 2024-04-30 LAB — SURGICAL PATHOLOGY

## 2024-05-01 ENCOUNTER — Ambulatory Visit: Payer: Self-pay | Admitting: Pediatrics

## 2024-05-29 ENCOUNTER — Telehealth: Payer: Self-pay | Admitting: *Deleted

## 2024-05-29 NOTE — Telephone Encounter (Signed)
 Copied from CRM [id number]. Topic: Clinical - Medical Advice >> May 29, 2024  8:01 AM Aleatha BROCKS wrote: Reason for CRM: Patient is having a gout faired up and start allapreanol 300 mg tablets and  colchicine  .6 mg and she wants to know if its ok and how many of the allapreanot that she can take and if she needs to continue the colchicine  please give patient a call to confirm [phone]

## 2024-05-30 NOTE — Telephone Encounter (Signed)
 Patient informed of the message below and voiced understanding

## 2024-06-13 ENCOUNTER — Other Ambulatory Visit: Payer: Self-pay | Admitting: Family Medicine

## 2024-06-16 NOTE — Progress Notes (Unsigned)
 "  [facility] Return Visit   Referring Provider Kimberly Stein ORN, MD [address] [place],  [place] [address]  Primary Care Provider Burchette, Stein ORN, MD  Patient Profile: Kimberly Stein is a 69 y.o. female who returns to the [facility] for follow-up of the problem(s) noted below.  Problem List: GERD Celiac disease Abdominal pain Chronic constipation History of colon polyps - TA, TVA, SSA Hepatic steatosis - minimally elevated ALT 46 in 2024  History of Present Illness    Discussed the use of AI scribe software for clinical note transcription with the patient, who gave verbal consent to proceed.  History of Present Illness Kimberly Stein is a 69 year old female with a past medical history noteworthy for HTN, aortic atherosclerosis, hypothyroidism, Mnire's disease, osteopenia, fibromyalgia who returns to the gastroenterology office for follow-up of  Current GI Meds    Interval History    History of adenomatous colon polyps including TVA - Suboptimal bowel preparation at time of colonoscopy 04/26/2024 - 1 subcentimeter colon polyp removed-SSA - Advised 2-day bowel prep as well as mag citrate 6 ounces p.o. twice daily x 5 days prior to next colonoscopy  GI Review of Symptoms Significant for {[id number]}. Otherwise negative.  General Review of Systems  Review of systems is significant for the pertinent positives and negatives as listed per the HPI.  Full ROS is otherwise negative.  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     Past Surgical History   Past Surgical History:  Procedure Laterality Date   CARDIAC CATHETERIZATION N/A 02/25/2015   Procedure: Left Heart Cath and Coronary Angiography;  Surgeon: Kimberly M Jordan, MD;  Location: [facility] INVASIVE CV LAB;  Service: Cardiovascular;  Laterality: N/A;   CERVICAL DISC SURGERY     CHOLECYSTECTOMY     COLONOSCOPY     DILATION AND CURETTAGE OF UTERUS     HAND SURGERY Right    right hand; calcium  deposit on wrist and under thumb and ganglion cyst and carpal tunnel   lumbar radiofrequency nerve ablation     twice yearly   MOUTH SURGERY  2016   couple times due to gum disease   ROTATOR CUFF REPAIR Left 06/07/2015   left shoulder   TOTAL ABDOMINAL HYSTERECTOMY     UPPER GASTROINTESTINAL ENDOSCOPY       Allergies and Medications   Allergies[1] @MEDSTODAY @  Family History   Family History  Problem Relation Age of Onset   Celiac disease Mother    Diabetes Mother    Heart disease Mother    Prostate cancer Father    Diabetes Sister    Celiac disease Sister    Diabetes Sister    Breast cancer Maternal Aunt    Colon cancer Neg Hx    Esophageal cancer Neg Hx    Liver cancer Neg Hx    Stomach cancer Neg Hx  Rectal cancer Neg Hx    GI Specific Family History: {gifamhx:50061}   Social History   Social History[2] Kimberly Stein reports that she has been smoking cigarettes. She has a 46 pack-year smoking history. She has never been exposed to tobacco smoke. She has never used smokeless tobacco. She reports that she does not drink alcohol and does not use drugs.  Vital Signs and Physical Examination   There were no vitals filed for this visit. There is no height or weight on file to calculate BMI.    General: Well developed, well nourished, no acute distress Head: Normocephalic and atraumatic Eyes: Sclerae anicteric, EOMI Ears: Normal auditory acuity Mouth: No  deformities or lesions noted Lungs: Clear throughout to auscultation Heart: Regular rate and rhythm; No murmurs, rubs or bruits Abdomen: Soft, non tender and non distended. No masses, hepatosplenomegaly or hernias noted. Normal Bowel sounds Rectal: Musculoskeletal: Symmetrical with no gross deformities  Pulses:  Normal pulses noted Extremities: No edema or deformities noted Neurological: Alert oriented x 4, grossly nonfocal Psychological:  Alert and cooperative. Normal mood and affect   Review of Data   The following data was reviewed at the time of this encounter:   Laboratory Studies      Latest Ref Rng & Units 03/14/2023    9:44 AM 01/21/2022    7:34 AM 10/16/2020    8:01 AM  CBC  WBC 4.0 - 10.5 K/uL 8.4  7.7  6.7   Hemoglobin 12.0 - 15.0 g/dL 85.2  85.5  86.5   Hematocrit 36.0 - 46.0 % 44.7  42.3  38.9   Platelets 150.0 - 400.0 K/uL 207.0  165.0  184.0     No results found for: LIPASE    Latest Ref Rng & Units 04/12/2024    8:12 AM 12/15/2023    8:44 AM 12/15/2023    8:43 AM  CMP  Glucose 70 - 99 mg/dL 816  95    BUN 6 - 23 mg/dL 23  33    Creatinine 9.59 - 1.20 mg/dL 8.65  8.61    Sodium 864 - 145 mEq/L 137  135    Potassium 3.5 - 5.1 mEq/L 4.3  4.2    Chloride 96 - 112 mEq/L 98  97    CO2 19 - 32 mEq/L 28  29    Calcium  8.4 - 10.5 mg/dL 9.8  9.6    Total Protein 6.0 - 8.3 g/dL  8.0  7.9   Total Bilirubin 0.2 - 1.2 mg/dL  0.4  0.3   Alkaline Phos 39 - 117 U/L  85  84   AST 0 - 37 U/L  17  19   ALT 0 - 35 U/L  13  14      Imaging Studies  CTAP 06/11/2023 1. No acute abnormality in the abdomen or pelvis. 2. Diffuse hepatic steatosis. 3. Aortic atherosclerosis.  GI Procedures and Studies   Colonoscopy 04/26/2024 - Preparation of the colon was inadequate.  - Hemorrhoids found on perianal exam.  - Stool in the entire examined colon.  - One 4 mm polyp versus lymphoid aggregate in the cecum, removed with a cold biopsy forceps. Resected and retrieved.  -  Melanosis in the colon.  - Internal hemorrhoids.  Path: SSA  EGD 04/30/2019 - for follow-up of celiac disease and unexplained chest pain Endoscopically normal  Path: Benign duodenal mucosa with patchy increased intraepithelial lymphocytes  Colonoscopy 09/13/2017 - One 7 mm polyp at the hepatic flexure, removed with a cold snare.  Resected and retrieved.  - Two 3 to 5 mm polyps in the transverse colon, removed with a cold biopsy forceps. Resected and retrieved.  - Internal hemorrhoids.  - The examination was otherwise normal on direct and retroflexion views.  Path: Tubular adenomas x 3  Colonoscopy 2012 Two 4 to 6 mm polyps in the rectum  Path: Reported to show tubulovillous adenoma; unable to access formal pathology report  Colonoscopy 2002 9 mm sessile polyp in descending colon status post piecemeal removal 6 mm polyp in descending colon status post hot biopsy 9 mm sessile polyp in sigmoid colon status post hot snare 6 mm polyp in sigmoid colon status post hot biopsy  Path: COLON, DESCENDING AND SIGMOID POLYPS: HYPERPLASTIC POLYPS. NO   ADENOMATOUS CHANGE OR MALIGNANCY IDENTIFIED.   Clinical Impression  It is my clinical impression that Ms. Sabine is a 69 y.o. female with;  ***  Plan  *** *** *** *** ***   Planned Follow Up No follow-ups on file.  The patient or caregiver verbalized understanding of the material covered, with no barriers to understanding. All questions were answered. Patient or caregiver is agreeable with the plan outlined above.    It was a pleasure to see Nikiya.  If you have any questions or concerns regarding this evaluation, do not hesitate to contact me.  Inocente Hausen, MD [facility]     [1]  Allergies Allergen Reactions   Crestor  [Rosuvastatin ] Other (See Comments)    Bilateral Leg pain   Lipitor [Atorvastatin  Calcium ] Other (See Comments)    Muscle aches   Nortriptyline Rash   Nortriptyline Hcl Rash    Red rash    Nsaids Other (See Comments)    Burn stomach    Vicodin [Hydrocodone-Acetaminophen] Itching  [2]  Social History Tobacco Use   Smoking status: Every Day    Current packs/day: 1.00    Average packs/day: 1 pack/day for 46.0 years (46.0 ttl pk-yrs)    Types: Cigarettes    Passive exposure: Never   Smokeless tobacco: Never   Tobacco comments:    07/03/2023 Patient smokes a pack daily  Vaping Use   Vaping status: Never Used  Substance Use Topics   Alcohol use: No    Alcohol/week: 0.0 standard drinks of alcohol   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, states she took 120mg  this morning   "

## 2024-06-19 ENCOUNTER — Ambulatory Visit: Payer: Self-pay | Admitting: Family Medicine

## 2024-06-19 ENCOUNTER — Other Ambulatory Visit (INDEPENDENT_AMBULATORY_CARE_PROVIDER_SITE_OTHER)

## 2024-06-19 ENCOUNTER — Ambulatory Visit: Admitting: Pediatrics

## 2024-06-19 ENCOUNTER — Encounter: Payer: Self-pay | Admitting: Pediatrics

## 2024-06-19 VITALS — BP 130/74 | HR 70 | Ht 63.0 in | Wt 153.0 lb

## 2024-06-19 DIAGNOSIS — E038 Other specified hypothyroidism: Secondary | ICD-10-CM

## 2024-06-19 DIAGNOSIS — Z860100 Personal history of colon polyps, unspecified: Secondary | ICD-10-CM | POA: Diagnosis not present

## 2024-06-19 DIAGNOSIS — K900 Celiac disease: Secondary | ICD-10-CM | POA: Diagnosis not present

## 2024-06-19 DIAGNOSIS — K5900 Constipation, unspecified: Secondary | ICD-10-CM

## 2024-06-19 DIAGNOSIS — K219 Gastro-esophageal reflux disease without esophagitis: Secondary | ICD-10-CM

## 2024-06-19 DIAGNOSIS — E785 Hyperlipidemia, unspecified: Secondary | ICD-10-CM | POA: Diagnosis not present

## 2024-06-19 DIAGNOSIS — R790 Abnormal level of blood mineral: Secondary | ICD-10-CM

## 2024-06-19 DIAGNOSIS — R1012 Left upper quadrant pain: Secondary | ICD-10-CM

## 2024-06-19 DIAGNOSIS — K760 Fatty (change of) liver, not elsewhere classified: Secondary | ICD-10-CM | POA: Diagnosis not present

## 2024-06-19 LAB — LIPID PANEL
Cholesterol: 264 mg/dL — ABNORMAL HIGH (ref 28–200)
HDL: 47.8 mg/dL
LDL Cholesterol: 157 mg/dL — ABNORMAL HIGH (ref 10–99)
NonHDL: 215.81
Total CHOL/HDL Ratio: 6
Triglycerides: 292 mg/dL — ABNORMAL HIGH (ref 10.0–149.0)
VLDL: 58.4 mg/dL — ABNORMAL HIGH (ref 0.0–40.0)

## 2024-06-19 LAB — MAGNESIUM: Magnesium: 1.5 mg/dL (ref 1.5–2.5)

## 2024-06-19 LAB — TSH: TSH: 15.15 u[IU]/mL — ABNORMAL HIGH (ref 0.35–5.50)

## 2024-06-19 MED ORDER — LINACLOTIDE 290 MCG PO CAPS: ORAL_CAPSULE | ORAL | 0 refills | Status: AC

## 2024-06-19 NOTE — Patient Instructions (Signed)
 Your provider has requested that you have labs drawn along with your primary care doctor's labs in 06/21/24.    Due to recent changes in healthcare laws, you may see the results of your imaging and laboratory studies on MyChart before your provider has had a chance to review them.  We understand that in some cases there may be results that are confusing or concerning to you. Not all laboratory results come back in the same time frame and the provider may be waiting for multiple results in order to interpret others.  Please give us  48 hours in order for your provider to thoroughly review all the results before contacting the office for clarification of your results.   We have given you samples of the following medication to take: Linzess  290 mcg, take 1 tab daily.  Follow up in 3 months or sooner if needed.  Thank you for entrusting me with your care and for choosing [facility], Dr. Inocente Hausen  _______________________________________________________  If your blood pressure at your visit was 140/90 or greater, please contact your primary care physician to follow up on this.  _______________________________________________________  If you are age 79 or older, your body mass index should be between 23-30. Your Body mass index is 27.1 kg/m. If this is out of the aforementioned range listed, please consider follow up with your Primary Care Provider.  If you are age 69 or younger, your body mass index should be between 19-25. Your Body mass index is 27.1 kg/m. If this is out of the aformentioned range listed, please consider follow up with your Primary Care Provider.   ________________________________________________________  The [facility] providers would like to encourage you to use MYCHART to communicate with providers for non-urgent requests or questions.  Due to long hold times on the telephone, sending your provider a message by [facility] may be a faster and more efficient way to get  a response.  Please allow 48 business hours for a response.  Please remember that this is for non-urgent requests.  _______________________________________________________  [facility] is using a team-based approach to care.  Your team is made up of your doctor and two to three APPS. Our APPS (Nurse Practitioners and Physician Assistants) work with your physician to ensure care continuity for you. They are fully qualified to address your health concerns and develop a treatment plan. They communicate directly with your gastroenterologist to care for you. Seeing the Advanced Practice Practitioners on your physician's team can help you by facilitating care more promptly, often allowing for earlier appointments, access to diagnostic testing, procedures, and other specialty referrals.

## 2024-06-20 ENCOUNTER — Other Ambulatory Visit: Payer: Self-pay | Admitting: Family Medicine

## 2024-06-21 ENCOUNTER — Encounter: Payer: Self-pay | Admitting: Family Medicine

## 2024-06-21 ENCOUNTER — Telehealth: Payer: Self-pay

## 2024-06-21 ENCOUNTER — Ambulatory Visit (INDEPENDENT_AMBULATORY_CARE_PROVIDER_SITE_OTHER): Admitting: Family Medicine

## 2024-06-21 ENCOUNTER — Telehealth: Payer: Self-pay | Admitting: Family Medicine

## 2024-06-21 VITALS — BP 132/80 | HR 86 | Temp 98.4°F | Wt 152.5 lb

## 2024-06-21 DIAGNOSIS — E039 Hypothyroidism, unspecified: Secondary | ICD-10-CM | POA: Diagnosis not present

## 2024-06-21 DIAGNOSIS — I10 Essential (primary) hypertension: Secondary | ICD-10-CM | POA: Diagnosis not present

## 2024-06-21 DIAGNOSIS — N183 Chronic kidney disease, stage 3 unspecified: Secondary | ICD-10-CM

## 2024-06-21 DIAGNOSIS — R7303 Prediabetes: Secondary | ICD-10-CM

## 2024-06-21 DIAGNOSIS — E7800 Pure hypercholesterolemia, unspecified: Secondary | ICD-10-CM

## 2024-06-21 MED ORDER — LEVOTHYROXINE SODIUM 200 MCG PO TABS: 200.0000 ug | ORAL_TABLET | Freq: Every day | ORAL | 3 refills | Status: AC

## 2024-06-21 MED ORDER — COLCHICINE 0.6 MG PO TABS: ORAL_TABLET | ORAL | 0 refills | Status: AC

## 2024-06-21 NOTE — Telephone Encounter (Signed)
 Left message for the patient to return my call. Rx sent

## 2024-06-21 NOTE — Addendum Note (Signed)
 Addended by: METTA KRISTEN CROME on: 06/21/2024 01:35 PM   Modules accepted: Orders

## 2024-06-21 NOTE — Telephone Encounter (Signed)
 Patient inquired if she can still take Allopurinol  and Colchicine  for gout? Patient is requesting refills on both.

## 2024-06-21 NOTE — Addendum Note (Signed)
 Addended by: MICHEAL WOLM ORN on: 06/21/2024 12:59 PM   Modules accepted: Level of Service

## 2024-06-21 NOTE — Progress Notes (Signed)
 "  Established Patient Office Visit  Subjective   Patient ID: Kimberly Stein, female    DOB: July 13, 1955  Age: 69 y.o. MRN: [id number]  Chief Complaint  Patient presents with   Medical Management of Chronic Issues    HPI   Kimberly Stein is here for chronic multiproblem follow-up.  She has history of multiple chronic problems including hypertension, celiac disease, GERD, hypothyroidism, Mnire's disease, chronic pain syndrome followed through methadone clinic, history of prediabetes.  She is currently followed by gastroenterologist and they have recommended several additional labs including hepatitis studies and tests to assess her liver  Kimberly Stein has hypothyroidism and is currently on levothyroxine  175 mcg daily.  We had increased this recently with TSH of 12.  She had follow-up lab yesterday with TSH surprisingly 15.  She states she currently takes her levothyroxine  at 11 PM at night after eating around 6 PM.  Does not take this near any of her other medications.  She takes her PPI early in the morning.  She states she is compliant with taking this daily  Hyperlipidemia.  Intolerant of multiple statins.  She declines PCSK9 inhibitors.  Did agree to Zetia  and recent cholesterol revealed total cholesterol improvement from 312-264 with improvement in LDL from 198-257.  She has hypertension treated with losartan  and chlorthalidone .  Also takes potassium replacement.  Recent magnesium improved to 1.5.  Not monitoring blood pressure currently at home.  Recent labs revealed chronic kidney disease range numbers with creatinine 1.34 and GFR around 40 by labs in November.  She is not taking any regular nonsteroidals.  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   Past Surgical History:  Procedure Laterality Date   CARDIAC CATHETERIZATION N/A 02/25/2015   Procedure: Left Heart Cath and Coronary Angiography;  Surgeon: Peter M Jordan, MD;  Location: [facility] INVASIVE CV LAB;  Service: Cardiovascular;  Laterality: N/A;   CERVICAL DISC SURGERY     CHOLECYSTECTOMY     COLONOSCOPY     DILATION AND CURETTAGE OF UTERUS     HAND SURGERY Right    right hand; calcium  deposit on wrist and under thumb and ganglion cyst and carpal tunnel   lumbar radiofrequency nerve ablation     twice yearly   MOUTH SURGERY  2016   couple times due to gum disease   ROTATOR CUFF REPAIR Left 06/07/2015   left shoulder   TOTAL ABDOMINAL HYSTERECTOMY     UPPER GASTROINTESTINAL ENDOSCOPY      reports that she has been smoking cigarettes. She has a 46 pack-year smoking history. She has never been exposed to tobacco smoke. She has never used smokeless tobacco. She reports that she does not drink alcohol and does not use drugs. family history includes Breast cancer in her maternal aunt; Celiac disease in her mother and sister; Diabetes in her mother, sister, and sister; Heart disease in her mother; Prostate cancer in her father. Allergies[1]  Review of Systems  Constitutional:  Negative for fever.  Eyes:  Negative for blurred vision.  Respiratory:  Negative for shortness of breath.   Cardiovascular:  Negative for chest pain.  Gastrointestinal:  Negative for abdominal pain.  Genitourinary:  Negative for dysuria.  Neurological:  Negative for dizziness, weakness and headaches.      Objective:     BP 132/80 (BP Location: Left Arm)   Pulse 86   Temp 98.4 F (36.9 C) (Oral)   Wt 152 lb 8 oz (69.2 kg)   SpO2 96%   BMI 27.01 kg/m  BP Readings from Last 3 Encounters:   06/21/24 132/80  06/19/24 130/74  04/26/24 (!) 140/79   Wt Readings from Last 3 Encounters:  06/21/24 152 lb 8 oz (69.2 kg)  06/19/24 153 lb (69.4 kg)  04/26/24 158 lb (71.7 kg)      Physical Exam Vitals reviewed.  Constitutional:      General: She is not in acute distress.    Appearance: She is well-developed.  Eyes:     Pupils: Pupils are equal, round, and reactive to light.  Neck:     Thyroid : No thyromegaly.     Vascular: No JVD.  Cardiovascular:     Rate and Rhythm: Normal rate and regular rhythm.     Heart sounds:     No gallop.  Pulmonary:     Effort: Pulmonary effort is normal. No respiratory distress.     Breath sounds: Normal breath sounds. No wheezing or rales.  Musculoskeletal:     Cervical back: Neck supple.  Neurological:     Mental Status: She is alert.      No results found for any visits on 06/21/24.    The 10-year ASCVD risk score (Arnett DK, et al., 2019) is: 20.6%    Assessment & Plan:   #1 hypertension.  Initial reading was up but improved significantly after repeat today.  Continue losartan  and chlorthalidone .  Future lab order to repeat basic metabolic panel in a few months.  Reminded to avoid all nonsteroidals and keep sodium down in diet  #2 hypothyroidism.  Recently under replaced.  We reviewed appropriately taking medication and she seems to be taking this on empty stomach without other medications.  Increase levothyroxine  to 200 mcg daily and recheck TSH in 3 months  #3 chronic kidney disease stage III by recent labs.  Avoid all nonsteroidals.  Stay well-hydrated.  Future lab order placed for basic metabolic panel  #4 hyperlipidemia.  Improved some on Zetia .  We again discussed PCSK9 inhibitors but she declines.  Handout given on heart healthy diet.  Continue low saturated fat diet.  Continue Zetia .  She has had side effects with multiple statins previously   No follow-ups on file.    Wolm Scarlet, MD     [1]   Allergies Allergen Reactions   Crestor  [Rosuvastatin ] Other (See Comments)    Bilateral Leg pain   Lipitor [Atorvastatin  Calcium ] Other (See Comments)    Muscle aches   Nortriptyline Rash   Nortriptyline Hcl Rash    Red rash   Nsaids Other (See Comments)    Burn stomach    Vicodin [Hydrocodone-Acetaminophen] Itching   "

## 2024-06-21 NOTE — Telephone Encounter (Signed)
Patient informed of the message below and rx sent.

## 2024-10-11 ENCOUNTER — Ambulatory Visit: Admitting: Family Medicine

## 2025-02-17 ENCOUNTER — Ambulatory Visit
